# Patient Record
Sex: Female | Born: 1945
Health system: Southern US, Community
[De-identification: ages and names within clinical notes are randomized; demographics above are authoritative.]

## PROBLEM LIST (undated history)

## (undated) DIAGNOSIS — M069 Rheumatoid arthritis, unspecified: Secondary | ICD-10-CM

## (undated) DIAGNOSIS — H3530 Unspecified macular degeneration: Secondary | ICD-10-CM

## (undated) DIAGNOSIS — I82409 Acute embolism and thrombosis of unspecified deep veins of unspecified lower extremity: Secondary | ICD-10-CM

## (undated) DIAGNOSIS — T7840XA Allergy, unspecified, initial encounter: Secondary | ICD-10-CM

## (undated) DIAGNOSIS — Z973 Presence of spectacles and contact lenses: Secondary | ICD-10-CM

## (undated) DIAGNOSIS — Z86010 Personal history of colonic polyps: Secondary | ICD-10-CM

## (undated) DIAGNOSIS — G250 Essential tremor: Secondary | ICD-10-CM

## (undated) DIAGNOSIS — R009 Unspecified abnormalities of heart beat: Secondary | ICD-10-CM

## (undated) DIAGNOSIS — N390 Urinary tract infection, site not specified: Secondary | ICD-10-CM

## (undated) DIAGNOSIS — L719 Rosacea, unspecified: Secondary | ICD-10-CM

## (undated) DIAGNOSIS — I10 Essential (primary) hypertension: Secondary | ICD-10-CM

## (undated) DIAGNOSIS — R51 Headache: Secondary | ICD-10-CM

## (undated) DIAGNOSIS — J302 Other seasonal allergic rhinitis: Secondary | ICD-10-CM

## (undated) DIAGNOSIS — D689 Coagulation defect, unspecified: Secondary | ICD-10-CM

## (undated) DIAGNOSIS — I495 Sick sinus syndrome: Secondary | ICD-10-CM

## (undated) DIAGNOSIS — D6852 Prothrombin gene mutation: Secondary | ICD-10-CM

## (undated) DIAGNOSIS — N8110 Cystocele, unspecified: Secondary | ICD-10-CM

## (undated) DIAGNOSIS — I809 Phlebitis and thrombophlebitis of unspecified site: Secondary | ICD-10-CM

## (undated) DIAGNOSIS — R001 Bradycardia, unspecified: Secondary | ICD-10-CM

## (undated) DIAGNOSIS — E785 Hyperlipidemia, unspecified: Secondary | ICD-10-CM

## (undated) DIAGNOSIS — R259 Unspecified abnormal involuntary movements: Secondary | ICD-10-CM

## (undated) DIAGNOSIS — K219 Gastro-esophageal reflux disease without esophagitis: Secondary | ICD-10-CM

## (undated) DIAGNOSIS — M5431 Sciatica, right side: Secondary | ICD-10-CM

## (undated) DIAGNOSIS — H269 Unspecified cataract: Secondary | ICD-10-CM

## (undated) DIAGNOSIS — N6019 Diffuse cystic mastopathy of unspecified breast: Secondary | ICD-10-CM

## (undated) DIAGNOSIS — Z8679 Personal history of other diseases of the circulatory system: Secondary | ICD-10-CM

## (undated) DIAGNOSIS — R519 Headache, unspecified: Secondary | ICD-10-CM

## (undated) DIAGNOSIS — Z860101 Personal history of adenomatous and serrated colon polyps: Secondary | ICD-10-CM

## (undated) DIAGNOSIS — R7302 Impaired glucose tolerance (oral): Principal | ICD-10-CM

## (undated) DIAGNOSIS — Z86718 Personal history of other venous thrombosis and embolism: Secondary | ICD-10-CM

## (undated) DIAGNOSIS — I4891 Unspecified atrial fibrillation: Secondary | ICD-10-CM

## (undated) DIAGNOSIS — G8929 Other chronic pain: Secondary | ICD-10-CM

## (undated) DIAGNOSIS — M1990 Unspecified osteoarthritis, unspecified site: Secondary | ICD-10-CM

## (undated) HISTORY — PX: VARICOSE VEIN SURGERY: SHX832

## (undated) HISTORY — DX: Sick sinus syndrome: I49.5

## (undated) HISTORY — PX: POLYPECTOMY: SHX149

## (undated) HISTORY — DX: Gastro-esophageal reflux disease without esophagitis: K21.9

## (undated) HISTORY — DX: Headache, unspecified: R51.9

## (undated) HISTORY — DX: Headache: R51

## (undated) HISTORY — DX: Bradycardia, unspecified: R00.1

## (undated) HISTORY — DX: Essential (primary) hypertension: I10

## (undated) HISTORY — DX: Sciatica, right side: M54.31

## (undated) HISTORY — DX: Hyperlipidemia, unspecified: E78.5

## (undated) HISTORY — DX: Coagulation defect, unspecified: D68.9

## (undated) HISTORY — PX: CATARACT EXTRACTION W/ INTRAOCULAR LENS IMPLANT: SHX1309

## (undated) HISTORY — DX: Essential tremor: G25.0

## (undated) HISTORY — DX: Phlebitis and thrombophlebitis of unspecified site: I80.9

## (undated) HISTORY — DX: Unspecified cataract: H26.9

## (undated) HISTORY — DX: Prothrombin gene mutation: D68.52

## (undated) HISTORY — DX: Unspecified abnormal involuntary movements: R25.9

## (undated) HISTORY — DX: Rheumatoid arthritis, unspecified: M06.9

## (undated) HISTORY — DX: Acute embolism and thrombosis of unspecified deep veins of unspecified lower extremity: I82.409

## (undated) HISTORY — DX: Rosacea, unspecified: L71.9

## (undated) HISTORY — PX: VEIN SURGERY: SHX48

## (undated) HISTORY — DX: Allergy, unspecified, initial encounter: T78.40XA

## (undated) HISTORY — DX: Impaired glucose tolerance (oral): R73.02

## (undated) HISTORY — DX: Urinary tract infection, site not specified: N39.0

## (undated) HISTORY — DX: Unspecified atrial fibrillation: I48.91

## (undated) HISTORY — PX: ABDOMINAL HYSTERECTOMY: SHX81

## (undated) HISTORY — PX: CATARACT EXTRACTION, BILATERAL: SHX1313

## (undated) HISTORY — DX: Diffuse cystic mastopathy of unspecified breast: N60.19

---
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
## 1974-10-17 HISTORY — PX: TUBAL LIGATION: SHX77

## 1995-10-18 HISTORY — PX: VAGINAL HYSTERECTOMY: SHX2639

## 1998-05-04 ENCOUNTER — Ambulatory Visit (HOSPITAL_COMMUNITY): Admission: RE | Admit: 1998-05-04 | Discharge: 1998-05-04 | Payer: Self-pay | Admitting: Internal Medicine

## 1998-06-29 ENCOUNTER — Other Ambulatory Visit: Admission: RE | Admit: 1998-06-29 | Discharge: 1998-06-29 | Payer: Self-pay | Admitting: Obstetrics and Gynecology

## 1998-10-17 DIAGNOSIS — I82409 Acute embolism and thrombosis of unspecified deep veins of unspecified lower extremity: Secondary | ICD-10-CM

## 1998-10-17 HISTORY — DX: Acute embolism and thrombosis of unspecified deep veins of unspecified lower extremity: I82.409

## 1999-05-04 ENCOUNTER — Encounter: Payer: Self-pay | Admitting: Internal Medicine

## 1999-05-04 ENCOUNTER — Ambulatory Visit (HOSPITAL_COMMUNITY): Admission: RE | Admit: 1999-05-04 | Discharge: 1999-05-04 | Payer: Self-pay | Admitting: Internal Medicine

## 1999-07-20 ENCOUNTER — Other Ambulatory Visit: Admission: RE | Admit: 1999-07-20 | Discharge: 1999-07-20 | Payer: Self-pay | Admitting: Obstetrics and Gynecology

## 2000-05-09 ENCOUNTER — Encounter: Payer: Self-pay | Admitting: Internal Medicine

## 2000-05-09 ENCOUNTER — Ambulatory Visit (HOSPITAL_COMMUNITY): Admission: RE | Admit: 2000-05-09 | Discharge: 2000-05-09 | Payer: Self-pay | Admitting: Internal Medicine

## 2000-07-18 ENCOUNTER — Other Ambulatory Visit: Admission: RE | Admit: 2000-07-18 | Discharge: 2000-07-18 | Payer: Self-pay | Admitting: Obstetrics and Gynecology

## 2001-05-16 ENCOUNTER — Encounter: Payer: Self-pay | Admitting: Internal Medicine

## 2001-05-16 ENCOUNTER — Ambulatory Visit (HOSPITAL_COMMUNITY): Admission: RE | Admit: 2001-05-16 | Discharge: 2001-05-16 | Payer: Self-pay | Admitting: Internal Medicine

## 2002-05-22 ENCOUNTER — Ambulatory Visit (HOSPITAL_COMMUNITY): Admission: RE | Admit: 2002-05-22 | Discharge: 2002-05-22 | Payer: Self-pay | Admitting: Internal Medicine

## 2002-05-22 ENCOUNTER — Encounter: Payer: Self-pay | Admitting: Internal Medicine

## 2003-06-05 ENCOUNTER — Ambulatory Visit (HOSPITAL_COMMUNITY): Admission: RE | Admit: 2003-06-05 | Discharge: 2003-06-05 | Payer: Self-pay | Admitting: Internal Medicine

## 2003-06-05 ENCOUNTER — Encounter: Payer: Self-pay | Admitting: Internal Medicine

## 2003-11-03 ENCOUNTER — Ambulatory Visit (HOSPITAL_COMMUNITY): Admission: RE | Admit: 2003-11-03 | Discharge: 2003-11-03 | Payer: Self-pay | Admitting: Internal Medicine

## 2004-06-22 ENCOUNTER — Ambulatory Visit (HOSPITAL_COMMUNITY): Admission: RE | Admit: 2004-06-22 | Discharge: 2004-06-22 | Payer: Self-pay | Admitting: Internal Medicine

## 2004-08-28 ENCOUNTER — Ambulatory Visit: Payer: Self-pay | Admitting: Cardiology

## 2004-08-28 ENCOUNTER — Inpatient Hospital Stay (HOSPITAL_COMMUNITY): Admission: EM | Admit: 2004-08-28 | Discharge: 2004-08-30 | Payer: Self-pay | Admitting: Emergency Medicine

## 2005-06-23 ENCOUNTER — Ambulatory Visit (HOSPITAL_COMMUNITY): Admission: RE | Admit: 2005-06-23 | Discharge: 2005-06-23 | Payer: Self-pay | Admitting: Obstetrics and Gynecology

## 2006-06-27 ENCOUNTER — Ambulatory Visit (HOSPITAL_COMMUNITY): Admission: RE | Admit: 2006-06-27 | Discharge: 2006-06-27 | Payer: Self-pay | Admitting: Obstetrics and Gynecology

## 2006-08-22 ENCOUNTER — Ambulatory Visit (HOSPITAL_COMMUNITY): Admission: RE | Admit: 2006-08-22 | Discharge: 2006-08-22 | Payer: Self-pay | Admitting: Internal Medicine

## 2006-10-26 ENCOUNTER — Ambulatory Visit: Payer: Self-pay

## 2006-10-26 ENCOUNTER — Encounter: Payer: Self-pay | Admitting: Cardiology

## 2007-07-02 ENCOUNTER — Ambulatory Visit (HOSPITAL_COMMUNITY): Admission: RE | Admit: 2007-07-02 | Discharge: 2007-07-02 | Payer: Self-pay | Admitting: Internal Medicine

## 2007-08-02 ENCOUNTER — Ambulatory Visit: Payer: Self-pay | Admitting: Gastroenterology

## 2007-08-14 ENCOUNTER — Ambulatory Visit: Payer: Self-pay | Admitting: Gastroenterology

## 2008-07-03 ENCOUNTER — Ambulatory Visit (HOSPITAL_COMMUNITY): Admission: RE | Admit: 2008-07-03 | Discharge: 2008-07-03 | Payer: Self-pay | Admitting: Obstetrics and Gynecology

## 2009-07-13 ENCOUNTER — Ambulatory Visit (HOSPITAL_COMMUNITY): Admission: RE | Admit: 2009-07-13 | Discharge: 2009-07-13 | Payer: Self-pay | Admitting: Internal Medicine

## 2009-07-16 ENCOUNTER — Encounter: Admission: RE | Admit: 2009-07-16 | Discharge: 2009-07-16 | Payer: Self-pay | Admitting: Internal Medicine

## 2010-07-22 ENCOUNTER — Ambulatory Visit (HOSPITAL_COMMUNITY): Admission: RE | Admit: 2010-07-22 | Discharge: 2010-07-22 | Payer: Self-pay | Admitting: Internal Medicine

## 2010-11-08 ENCOUNTER — Encounter: Payer: Self-pay | Admitting: Internal Medicine

## 2010-12-16 DIAGNOSIS — Z8679 Personal history of other diseases of the circulatory system: Secondary | ICD-10-CM

## 2010-12-16 HISTORY — DX: Personal history of other diseases of the circulatory system: Z86.79

## 2011-01-14 ENCOUNTER — Inpatient Hospital Stay (HOSPITAL_COMMUNITY)
Admission: EM | Admit: 2011-01-14 | Discharge: 2011-01-17 | DRG: 310 | Disposition: A | Discharge status: Home / Self Care | Payer: Medicare Other | Source: Ambulatory Visit | Attending: Interventional Cardiology | Admitting: Interventional Cardiology

## 2011-01-14 ENCOUNTER — Emergency Department (HOSPITAL_COMMUNITY)
Admission: EM | Admit: 2011-01-14 | Discharge: 2011-01-14 | Disposition: A | Discharge status: Other Acute Inpatient Hospital | Payer: Medicare Other | Attending: Emergency Medicine | Admitting: Emergency Medicine

## 2011-01-14 DIAGNOSIS — I10 Essential (primary) hypertension: Secondary | ICD-10-CM | POA: Diagnosis present

## 2011-01-14 DIAGNOSIS — I080 Rheumatic disorders of both mitral and aortic valves: Secondary | ICD-10-CM | POA: Diagnosis present

## 2011-01-14 DIAGNOSIS — T448X5A Adverse effect of centrally-acting and adrenergic-neuron-blocking agents, initial encounter: Secondary | ICD-10-CM | POA: Diagnosis present

## 2011-01-14 DIAGNOSIS — I4891 Unspecified atrial fibrillation: Secondary | ICD-10-CM | POA: Diagnosis present

## 2011-01-14 DIAGNOSIS — R0602 Shortness of breath: Secondary | ICD-10-CM | POA: Diagnosis present

## 2011-01-14 DIAGNOSIS — T463X5A Adverse effect of coronary vasodilators, initial encounter: Secondary | ICD-10-CM | POA: Diagnosis present

## 2011-01-14 DIAGNOSIS — E785 Hyperlipidemia, unspecified: Secondary | ICD-10-CM | POA: Diagnosis present

## 2011-01-14 DIAGNOSIS — Z7982 Long term (current) use of aspirin: Secondary | ICD-10-CM

## 2011-01-14 DIAGNOSIS — I498 Other specified cardiac arrhythmias: Principal | ICD-10-CM | POA: Diagnosis present

## 2011-01-14 DIAGNOSIS — R51 Headache: Secondary | ICD-10-CM | POA: Diagnosis present

## 2011-01-14 LAB — CBC
HCT: 34.3 % — ABNORMAL LOW (ref 36.0–46.0)
Hemoglobin: 11.2 g/dL — ABNORMAL LOW (ref 12.0–15.0)
MCHC: 32.7 g/dL (ref 30.0–36.0)
RBC: 3.7 MIL/uL — ABNORMAL LOW (ref 3.87–5.11)
RDW: 12.1 % (ref 11.5–15.5)
WBC: 8.2 10*3/uL (ref 4.0–10.5)

## 2011-01-14 LAB — DIFFERENTIAL
Basophils Relative: 1 % (ref 0–1)
Eosinophils Relative: 3 % (ref 0–5)
Monocytes Relative: 8 % (ref 3–12)
Neutro Abs: 4.4 10*3/uL (ref 1.7–7.7)
Neutrophils Relative %: 53 % (ref 43–77)

## 2011-01-14 LAB — CK TOTAL AND CKMB (NOT AT ARMC)
CK, MB: 0.8 ng/mL (ref 0.3–4.0)
Total CK: 89 U/L (ref 7–177)

## 2011-01-14 LAB — BASIC METABOLIC PANEL
BUN: 17 mg/dL (ref 6–23)
GFR calc non Af Amer: 48 mL/min — ABNORMAL LOW (ref >60)
Glucose, Bld: 128 mg/dL — ABNORMAL HIGH (ref 70–99)
Potassium: 4.4 mEq/L (ref 3.5–5.1)

## 2011-01-15 ENCOUNTER — Inpatient Hospital Stay (HOSPITAL_COMMUNITY): Payer: Medicare Other

## 2011-01-15 DIAGNOSIS — I495 Sick sinus syndrome: Secondary | ICD-10-CM

## 2011-01-15 LAB — COMPREHENSIVE METABOLIC PANEL
AST: 32 U/L (ref 0–37)
Albumin: 3.1 g/dL — ABNORMAL LOW (ref 3.5–5.2)
BUN: 15 mg/dL (ref 6–23)
Chloride: 107 mEq/L (ref 96–112)
Creatinine, Ser: 1.05 mg/dL (ref 0.4–1.2)
GFR calc Af Amer: >60 mL/min (ref >60)
GFR calc non Af Amer: 53 mL/min — ABNORMAL LOW (ref >60)
Glucose, Bld: 99 mg/dL (ref 70–99)
Total Protein: 6 g/dL (ref 6.0–8.3)

## 2011-01-15 LAB — CARDIAC PANEL(CRET KIN+CKTOT+MB+TROPI)
CK, MB: 0.6 ng/mL (ref 0.3–4.0)
CK, MB: 1.7 ng/mL (ref 0.3–4.0)
Relative Index: INVALID (ref 0.0–2.5)
Relative Index: 1.5 (ref 0.0–2.5)
Total CK: 74 U/L (ref 7–177)
Total CK: 111 U/L (ref 7–177)

## 2011-01-15 LAB — TSH: TSH: 4.46 u[IU]/mL (ref 0.350–4.500)

## 2011-01-15 LAB — MRSA PCR SCREENING: MRSA by PCR: NEGATIVE

## 2011-01-16 DIAGNOSIS — I059 Rheumatic mitral valve disease, unspecified: Secondary | ICD-10-CM

## 2011-01-19 NOTE — Discharge Summary (Signed)
NAMERON, JUNCO NO.:  [id number]  MEDICAL RECORD NO.:  [id number]           PATIENT TYPE:  I  LOCATION:  2039                         FACILITY:  [facility]  PHYSICIAN:  Armanda Magic, M.D.     DATE OF BIRTH:  June 09, 1946  DATE OF ADMISSION:  01/14/2011 DATE OF DISCHARGE:  01/17/2011                              DISCHARGE SUMMARY   ADMISSION DIAGNOSES: 1. Dizzy. 2. Junctional bradycardia. 3. Hyperlipidemia. 4. Chronic headaches. 5. Shortness of breath. 6. Hypertension.  DISCHARGE DIAGNOSES: 1. Junctional bradycardia secondary to beta-blocker and calcium     channel blocker use resolved, off beta blockers and calcium channel     blockers. 2. History of atrial fibrillation with rapid ventricular response in     the past. 3. Shortness of breath with normal left ventricular function by 2-D     echocardiogram. 4. Hypertension. 5. Chronic headaches. 6. Dyslipidemia.  PROCEDURES:  None.  HISTORY OF PRESENT ILLNESS:  This is a 65 year old female with a history of dyslipidemia and hypertension who apparently has a diagnosis of rapid heart beat in the past and had been treated with verapamil and atenolol. There was a question as to whether she had a diagnosis of atrial fibrillation was unclear on this admission.  About 6 days prior days prior to admission, she had onset of diarrhea with dizziness followed by episodic dizziness and shortness of breath.  She denied any chest pain, but complained of fatigue and malaise and had intermittent shortness of breath throughout the week.  She presented to the emergency room and was found to be in a junctional rhythm, was given atropine and calcium chloride.  She denied any syncopal episode.  Her verapamil and atenolol were stopped and she had complete resolution of her bradycardia and junctional rhythm.  Her blood pressure remained stable and well- controlled throughout her hospital course off the verapamil  and atenolol.  A 2-D echocardiogram was performed which showed normal LV function, EF 55-60% with mild LVH, mild aortic and mitral regurgitation. On the day of discharge, she was in stable condition, ambulating the hall without difficulty.  Her labs TSH 4.46.  Cardiac enzymes were negative x3.  Sodium 137, potassium 4.4, chloride 103, CO2 of 26, glucose 128, BUN 17, creatinine 1.14, calcium 9.4, white cell count 8.2, hemoglobin 11.2, hematocrit 34.3, platelet count 274.  Chest x-ray showed no acute cardiopulmonary findings.  DISCHARGE MEDICATIONS: 1. Simvastatin 40 mg daily. 2. Aspirin 81 mg daily. 3. Vitamin B12 one tablet every other day. 4. She was instructed to stop her verapamil and atenolol.  FOLLOWUP:  She will follow up with Dr. Verdis Prime on Tuesday, January 18, 2011 at 11:45 a.m. for reassessment of her blood pressure as well as the medication changes to determine if she needs to be on anything else given her history of paroxysmal palpitations in the past.  Also to determine whether she needs to have further risk ratification with nuclear stress testing due to her shortness of breath.  I spent a total of 35 minutes in preparing this patient's discharge including discharge note, preparing discharge medication list, and discharge followup and dictating this  discharge note.     Armanda Magic, M.D.     TT/MEDQ  D:  01/17/2011  T:  01/18/2011  Job:  [id number]  Electronically Signed by Armanda Magic M.D. on 01/19/2011 11:26:31 AM

## 2011-01-26 ENCOUNTER — Encounter (INDEPENDENT_AMBULATORY_CARE_PROVIDER_SITE_OTHER): Payer: Medicare Other

## 2011-01-26 DIAGNOSIS — R609 Edema, unspecified: Secondary | ICD-10-CM

## 2011-01-26 DIAGNOSIS — M79609 Pain in unspecified limb: Secondary | ICD-10-CM

## 2011-01-28 NOTE — Procedures (Unsigned)
DUPLEX DEEP VENOUS EXAM - UPPER EXTREMITY  INDICATION:  Left upper extremity pain and edema.  HISTORY:  Edema:  Yes. Trauma/Surgery:  IV placement on 01/13/2011. Pain:  Yes. PE:  No. Previous DVT:  Left lower extremity phlebitis in 2008.  No DVT present. Anticoagulants:  Patient takes 325 mg daily of aspirin. Other:  DUPLEX EXAM:                                            Bas/               IJV   SCV     AXV    BrachV  Ceph V               R  L  R   L   R  L   R   L   R  L Thrombosis       o  o   o      o       o      o/+ Spontaneous      +  +   +      +       +      +/o Phasic           +  +   +      +       +      +/o Augmentation     +  +   +      +       +      +/o Compressible     +  +   +      +       +      +/o Competent Legend:  + - yes  o - no  p - partial  D - decreased   IMPRESSION: 1. No evidence of deep vein thrombosis identified involving the left     upper extremity. 2. Acute superficial thrombus identified involving the left cephalic     vein at the proximal upper arm segment to the antecubital fossa. 3. The remainder of the cephalic vein appears patent both above and     below thrombosed segment. 4. The basilic vein appears patent. 5. The contralateral subclavian vein is imaged and appears patent with     normal spontaneous and phasic flow.  A verbal preliminary was given to Loree Fee, PA-C, and she instructed patient to return to her office.       ___________________________________________ Quita Skye. Hart Rochester, M.D.  SH/MEDQ  D:  01/26/2011  T:  01/26/2011  Job:  [id number]

## 2011-02-02 NOTE — H&P (Signed)
NAME:  [name], [name] NO.:  [id number]  MEDICAL RECORD NO.:  [id number]           PATIENT TYPE:  E  LOCATION:  WLED                         FACILITY:  [facility]  PHYSICIAN:  Georga Hacking, M.D.DATE OF BIRTH:  09/22/1946  DATE OF ADMISSION:  01/14/2011                              HISTORY & PHYSICAL   REASON FOR ADMISSION:  Dizziness and bradycardia.  HISTORY:  The patient is a 65 year old female, who has a previous history of hypotension and dyslipidemia.  She had an admission in November 2005 to rule out a myocardial infarction and evidently had a normal Cardiolite study at that time.  She has normally felt well, but reports that she was diagnosed with some rapid heartbeat and has been treated with verapamil and atenolol.  About 6 days ago, she had the onset of diarrhea and had significant dizziness following that has had episodic dizziness and shortness of breath since then with further episode of diarrhea.  She has not had chest pain, but has complained of fatigue, malaise, and some intermittent dyspnea throughout the week. She felt poorly today and came to the emergency room where she was found to be in a junctional rhythm.  The emergency room doctor gave her a dose of atropine as well as calcium chloride.  She admits she was really never hypotensive and has not had any syncopal episode since then.  She has not had previous symptoms like this previously.  She denies PND, orthopnea, edema, or claudication.  She does have a history of some varicose veins and had difficulty with vein issues in the past.  PAST MEDICAL HISTORY:  Hypotension, hyperlipidemia, chronic headaches, and was evidently had some sort of vein stripping in the past.  PREVIOUS SURGERIES:  Tonsillectomy, hysterectomy, has had vein stripping previously.  ALLERGIES:  SULFA and VIBRAMYCIN.  CURRENT MEDICATIONS:  Atenolol, verapamil.  She previously was on Topamax.  She was previous on  Bumex, but this was discontinued.  FAMILY HISTORY:  Father died at age 10 of a stroke and heart disease. Mother died of liver cancer at age 39.  One sister is living and in good health.  SOCIAL HISTORY:  She is divorced.  She is not currently working.  She has three children.  Currently lives alone.  REVIEW OF SYSTEMS:  Her weight has been stable.  She thinks that she might have lost little bit of weight.  She previously had significant headaches and was on Topamax for these, but this has been stopped as a concern of visual problems.  She has no ear, nose, or throat problems. She has had some diarrhea as noted above.  No constipation.  Normally no ulcers or bleeding.  No GU symptoms.  She has had arthritis involving her right hand.  She has not had any swelling or leg tenderness.  She reports during one of the surgeries on her leg that she had difficulty moving her leg and the question was raised of stroke, although I cannot really find anything else pertaining to that.  Other than as noted above, the remainder of systems is unremarkable.  PHYSICAL EXAMINATION:  GENERAL:  She is  a pleasant middle-aged female, appearing stated age, lying on stretcher, in no acute distress. VITAL SIGNS:  Blood pressure is 110-130 over 60 with a slow pulse rate, occasional PVCs noted.  Respirations were 12. SKIN:  Warm and dry with no obvious mass or lesions. ENT:  EOMI.  PERRLA.  C and S clear.  Funduscopic was not examined. Pharynx is negative. NECK:  Supple without masses, JVD, thyromegaly, or bruits. LUNGS:  Clear bilaterally. CARDIOVASCULAR:  Slow rhythm.  No murmur noted.  Heart sounds somewhat muffled. ABDOMEN:  Soft and nontender without aneurysm or mass. EXTREMITIES:  Femoral and distal pulses are present, although the pulse is slow.  There is no edema noted. NEUROLOGIC:  Shows normal cranial nerves, sensory and motor were intact, was unable to examine gait due to bradycardia.  LABORATORY  DATA:  A 12-lead EKG shows junctional rhythm, some EKG shows episodic atrial fibrillation with slow response.  Occasional PVCs are also noted.  There is no chest x-ray.  Her lab data shows a hemoglobin of 11.2, hematocrit of 34.3, potassium is 4.4, BUN is 17, creatinine is 1.14.  Initial point-of-care enzymes are normal.  IMPRESSION: 1. Severe junctional rhythm with dizziness, but maintains blood     pressure. 2. Hypertension. 3. Previous history of hyperlipidemia. 4. History of headaches. 5. Recent diarrhea.  RECOMMENDATIONS:  The patient will be transferred to [facility] from [place].  We will check serial enzymes on her.  Her blood pressure is maintained now, so I do not think she is to have a temporary pacemaker.  She will need to have an echocardiogram and we will withhold her verapamil and atenolol and have her on no blood pressure medicines at the present time.  See what the heart rhythm does withdrawing AV nodal blocking agents and go from there in terms of treatment.     Georga Hacking, M.D.     WST/MEDQ  D:  01/14/2011  T:  01/14/2011  Job:  [id number]  cc:   Lyn Records, M.D. Lucky Cowboy, M.D.  Electronically Signed by Lacretia Nicks. Donnie Aho M.D. on 02/02/2011 02:05:09 PM

## 2011-03-04 NOTE — H&P (Signed)
NAME:  Ashley, [name] NO.:  [id number]   MEDICAL RECORD NO.:  [id number]          PATIENT TYPE:  EMS   LOCATION:  MAJO                         FACILITY:  [facility]   PHYSICIAN:  Marcie Mowers, M.D.DATE OF BIRTH:  02-Mar-1946   DATE OF ADMISSION:  08/28/2004  DATE OF DISCHARGE:                                HISTORY & PHYSICAL   PRIMARY CARE PHYSICIAN:  Lucky Cowboy, M.D.   CHIEF COMPLAINT:  Left sided chest pain since last night.   HISTORY OF PRESENT ILLNESS:  This is a 65 year old Caucasian female with a  past medical history of hypertension and abnormal cholesterol presents to  the emergency department with a history of left sided chest pain since last  night.  The chest pain started when she was in her bed trying to sleep.  It  was sudden in onset.  It was mostly on the left side, sometimes radiating to  her back.  It was pressure like in character.  It lasted 10-15 minutes and  then it resolved without taking any medications.  The pain recurred through  the night once with all the similar characteristics.  The patient states  that she has not felt right at work for the last two days.  She has been  nauseated for the same duration.  The patient also states that approximately  three months ago she had one occasion when she had a similar kind of pain in  the left side of her chest.  The patient does report that she has had  shortness of breath occasionally lately.  It is more when she tries to lay  down flat.  She always has to take at least two pillows.  She also reports  that she has sometimes gotten up in the middle of the night feeling hungry  for air.  She states that her primary care physician started her on some  inhalers because of this shortness of breath.  She has noticed some swelling  in her feet on and off but it is only when she comes back from long hours of  work.  The swelling is gone when she lies down and now wakes up in the  morning.   PAST MEDICAL HISTORY:  1.  Hypertension.  2.  Abnormal cholesterol.  3.  She does not have any history of diabetes.  She has never had a stress      test done in the past.   PAST SURGICAL HISTORY:  1.  Partial hysterectomy at the age of 83.  This was done because of      excessive vaginal bleeding.  2.  Tonsillectomy several years ago.   MEDICATIONS:  1.  Accupril 40 mg p.o. every day.  2.  Verapamil 240 mg p.o. every day.  3.  Atenolol 50 mg p.o. every day.  4.  Bumetanide 1 mg p.o. every day.  5.  Claritin p.r.n.   ALLERGIES:  NKDA.   SOCIAL HISTORY:  She lives with her youngest son in a townhouse.  She is  very active.  She works full time.  Walks about 4-5 miles  daily.  She  reports that she has been under stress lately at work because of very long  work hours.  She denies any tobacco or alcohol consumption ever.   FAMILY HISTORY:  There is a history of Alzheimer disease in her mother who  is 48 years old.  Her father is 29 and overall healthy.  She has two sons  and one daughter and they are all healthy.  Her grandmother died of colon  cancer.   REVIEW OF SYSTEMS:  As mentioned in the history of present illness, there  has been presence of some nausea, some shortness of breath occasionally, and  at least one occasion about three months ago when she had similar kind of  chest pain.  She denies any fever, cough, sputum production, any history of  wheezing, any dysuria, urgency, frequency, any abdominal pain, or emesis,  denies any diarrhea.  As mentioned in HPI, she does have some swelling in  her feet on and off but it is mostly related to being on her feet all day  long.   PHYSICAL EXAMINATION:  GENERAL:  The patient looks alert and awake.  She  does not appear to be in acute distress.  VITAL SIGNS:  Her temperature is 98 degrees, blood pressure upon arrival to  the emergency department was 158/75, a repeat blood pressure after four  hours was 112/54.  Her pulse is 72  per minute.  She is breathing at 16 per  minute.  Her pulse ox on room air is 100%.  HEENT:  Her pupils are equally reactive to light and accommodation.  Extraocular muscles are intact.  Her pharynx looks normal without any  erythema or exudate.  The head is normocephalic and atraumatic.  NECK:  There is no JVD.  No lymphadenopathy.  Thyroid appears normal.  No  carotid bruit.  SKIN:  Looks normal.  CARDIOVASCULAR:  Normal S1 S2.  No murmur appreciated.  No S3 or S4  appreciated.  PULMONARY:  Clear to auscultation bilaterally.  No crackles or wheezes.  ABDOMEN:  Nontender, nondistended.  It is soft.  There is no organomegaly.  The bowel sounds are present in all quadrants.  EXTREMITIES:  Without any clubbing, cyanosis, edema.  NEUROLOGIC:  Her cranial nerves II-XII are intact.  Overall exam is  nonfocal.   LABS:  White count is 6.5, hemoglobin 13.2, hematocrit of 37.4, her platelet  count is 236,000.  MCV is 88.4.  The differential on white count is normal.  Sodium is 139, potassium of 2.6, chloride 104, CO2 of 26, BUN 13, creatinine  of 0.9.  Blood glucose is 108.  Total bilirubin is 0.6.  Alkaline  phosphatase is 63.  AST 22, ALT 19, total protein is 7.1, albumin is 4.0,  calcium is 9.5.  First set of cardiac enzymes revealed a CK-MB of 1.0,  troponin less than 0.05, myoglobin 69.7.  Her INR is 1.2 and pro time of  14.7.   Chest x-ray reveals no acute disease, heart size and mediastinal contours  within normal limits, lungs are clear.   A 12-lead ECG shows a normal sinus rhythm, some nonspecific T wave changes  in leads V3, V4, and V5.   ASSESSMENT/PLAN:  1.  Chest pain.  We will admit the patient to telemetry.  The patient has      risk factors for myocardial infarction which include hypertension,      reportedly abnormal cholesterol by history which was being managed by  diet and she is more than 65 years old.  She will have three sets of     cardiac enzymes and three  12-lead electrocardiograms done eight hours      apart.  She will be started on aspirin 81 mg p.o. every day, Nitro paste      as needed for chest pain.  She will be started on healthy heart diet.      She will also need a stress test.  It overall depends on her hospital      course whether this stress test should be done while she is in the      hospital or post discharge.  2.  Shortness of breath.  The patient reports a history suggestive of      orthopnea and paroxysmal nocturnal dyspnea.  Currently she does not have      any clinical signs suggestive of any congestive heart failure.  Given      her long history of hypertension and even though her 12-lead      electrocardiogram is not suggestive, it will be reasonable to get a 2D      echocardiogram.  I will also check her BNP today.  We will continue her      on the bumetanide 1 mg orally every day which she has been taking for      over four years.  3.  Hypertension.  Her hypertension looks well controlled.  We will continue      her on her usual medications which include Accupril, atenolol,      verapamil.  4.  Abnormal cholesterol.  This is by history.  She has not been on any      medications.  Reportedly, her primary care physician was going to      discuss the issue with her.  I will check her fasting lipid profile.  5.  Deep vein thrombosis, peptic ulcer disease prophylaxis.  The patient      will be on bedrest until she is ruled out for a myocardial infarction.      If she is not able to ambulate for over 24 hours, we will start her on      the prophylaxis.       PMJ/MEDQ  D:  08/28/2004  T:  08/28/2004  Job:  [id number]

## 2011-03-04 NOTE — Discharge Summary (Signed)
NAMEJONIYA, [name] NO.:  [id number]   MEDICAL RECORD NO.:  [id number]          PATIENT TYPE:  INP   LOCATION:  3731                         FACILITY:  [facility]   PHYSICIAN:  Lonia Blood, M.D.      DATE OF BIRTH:  April 05, 1946   DATE OF ADMISSION:  08/28/2004  DATE OF DISCHARGE:  08/30/2004                                 DISCHARGE SUMMARY   DISCHARGE DIAGNOSIS:  1.  Chest pain, myocardial infarction ruled out.  2.  Transient shortness of breath.  3.  Hypertension.  4.  Dyslipidemia.  5.  Hypokalemia.   DISCHARGE MEDICATIONS:  The patient is being discharged on home medicines  including Accupril 40 mg daily, Verapamil 240 mg daily, Atenolol 50 mg  daily, Bumetanide 1 mg daily, and Claritin p.r.n., aspirin 81 mg daily.   DISPOSITION:  The patient is to follow up with primary care physician as  needed.  She is being discharged in good health with no repeat chest pain  since admission.  She also has a Cardiolite that was performed this morning  and the results are pending at this time.  The patient will be discharged,  if the Cardiolite result is normal, she will go home today, otherwise, this  will serve as an interim summary.   PROCEDURE PERFORMED:  1.  Cardiolite performed on August 30, 2004, the results are still      pending.  2.  Chest x-ray performed on admission showed no acute disease.   CONSULTATIONS:  None.   BRIEF HISTORY AND PHYSICAL:  Please refer to dictated history and physical.  In brief, this is a 65 year old white female who presented with left sided  chest pain.  The patient has history of long-standing hypertension,  dyslipidemia, and a family history of diabetes, family history of coronary  artery disease, which puts her in high risk status, hence, she was admitted  for rule out MI.   HOSPITAL COURSE:  Problem 1:  Chest pain.  The patient was admitted.  She had serial enzymes  checked to rule out MI.  Her EKG did not have acute  changes.  While in the  hospital, all her enzymes came back as negative.  Her chest pain seems to  have gotten better and she did not have any repeat prior to this discharge.  With the enzymes negative, she had a stress test done on August 30, 2004.  Based on the results of these tests, the patient will probably be discharged  home.   Problem 2:  Hypertension.  The patient's blood pressure was well controlled  on her home medicines and we did not make any changes.   Problem 3:  Dyslipidemia.  The patient has a history of dyslipidemia.  She  is not on any statin.  We will repeat her fasting lipid panel this admission  and her fasting lipids seem to be normal with total cholesterol 173,  triglycerides 136, HDL 41, and LDL mildly elevated at 105.   Problem 4:  Hypokalemia.  The patient had a low potassium on the second day  of admission, mainly at  3.6.  Her potassium is stable at 3.8 at the time of  discharge.       LG/MEDQ  D:  08/30/2004  T:  08/30/2004  Job:  [id number]

## 2012-02-11 ENCOUNTER — Emergency Department (HOSPITAL_COMMUNITY)
Admission: EM | Admit: 2012-02-11 | Discharge: 2012-02-12 | Disposition: A | Discharge status: Home / Self Care | Payer: Medicare Other | Attending: Emergency Medicine | Admitting: Emergency Medicine

## 2012-02-11 ENCOUNTER — Encounter (HOSPITAL_COMMUNITY): Payer: Self-pay

## 2012-02-11 DIAGNOSIS — Z79899 Other long term (current) drug therapy: Secondary | ICD-10-CM | POA: Insufficient documentation

## 2012-02-11 DIAGNOSIS — Z86718 Personal history of other venous thrombosis and embolism: Secondary | ICD-10-CM | POA: Insufficient documentation

## 2012-02-11 DIAGNOSIS — T7840XA Allergy, unspecified, initial encounter: Secondary | ICD-10-CM

## 2012-02-11 DIAGNOSIS — Z888 Allergy status to other drugs, medicaments and biological substances status: Secondary | ICD-10-CM | POA: Insufficient documentation

## 2012-02-11 DIAGNOSIS — T368X5A Adverse effect of other systemic antibiotics, initial encounter: Secondary | ICD-10-CM | POA: Insufficient documentation

## 2012-02-11 DIAGNOSIS — E860 Dehydration: Secondary | ICD-10-CM

## 2012-02-11 HISTORY — DX: Unspecified abnormalities of heart beat: R00.9

## 2012-02-11 MED ORDER — FAMOTIDINE 20 MG PO TABS
20.0000 mg | ORAL_TABLET | Freq: Once | ORAL | Status: AC
  Administered 2012-02-12: 20 mg via ORAL
  Filled 2012-02-11: qty 1

## 2012-02-11 MED ORDER — DIPHENHYDRAMINE HCL 25 MG PO CAPS
25.0000 mg | ORAL_CAPSULE | Freq: Once | ORAL | Status: AC
  Administered 2012-02-12: 25 mg via ORAL
  Filled 2012-02-11: qty 1

## 2012-02-11 MED ORDER — PREDNISONE 20 MG PO TABS
60.0000 mg | ORAL_TABLET | Freq: Once | ORAL | Status: DC
  Filled 2012-02-11: qty 3

## 2012-02-11 NOTE — ED Notes (Signed)
Pt presents with no acute distress.  Pt started on cipro BID for UTI- Pt c/o of lip swelling, facial swelling and tingling to ears bil.  Pt able to speak in complete sentences.  Pt also c/o of inability to void

## 2012-02-11 NOTE — ED Notes (Signed)
Pt states she has been on Cipro for UTI for 2 days.  Now pt having difficulty urinating, lips tingling, b/p elevated  185/93 at home, heart feels like it is racing.

## 2012-02-12 LAB — POCT I-STAT, CHEM 8
Calcium, Ion: 1.1 mmol/L — ABNORMAL LOW (ref 1.12–1.32)
Glucose, Bld: 112 mg/dL — ABNORMAL HIGH (ref 70–99)
HCT: 36 % (ref 36.0–46.0)
Hemoglobin: 12.2 g/dL (ref 12.0–15.0)
TCO2: 23 mmol/L (ref 0–100)

## 2012-02-12 LAB — URINALYSIS, ROUTINE W REFLEX MICROSCOPIC
Glucose, UA: NEGATIVE mg/dL
Leukocytes, UA: NEGATIVE
Nitrite: NEGATIVE
Specific Gravity, Urine: 1.022 (ref 1.005–1.030)
pH: 6 (ref 5.0–8.0)

## 2012-02-12 LAB — CBC
HCT: 33.4 % — ABNORMAL LOW (ref 36.0–46.0)
Hemoglobin: 11.6 g/dL — ABNORMAL LOW (ref 12.0–15.0)
RBC: 3.73 MIL/uL — ABNORMAL LOW (ref 3.87–5.11)

## 2012-02-12 MED ORDER — DIPHENHYDRAMINE HCL 25 MG PO CAPS: 25.0000 mg | ORAL_CAPSULE | Freq: Four times a day (QID) | ORAL | Status: DC | PRN

## 2012-02-12 MED ORDER — FAMOTIDINE 20 MG PO TABS: 20.0000 mg | ORAL_TABLET | Freq: Two times a day (BID) | ORAL | Status: DC

## 2012-02-12 MED ORDER — SODIUM CHLORIDE 0.9 % IV BOLUS (SEPSIS)
1000.0000 mL | Freq: Once | INTRAVENOUS | Status: AC
  Administered 2012-02-12: 1000 mL via INTRAVENOUS

## 2012-02-12 MED ORDER — SODIUM CHLORIDE 0.9 % IV SOLN: INTRAVENOUS | Status: DC

## 2012-02-12 NOTE — ED Notes (Signed)
Foley catheter insertion performed. 15 ml of urine noted on output. Urine specimen sent to lab.

## 2012-02-12 NOTE — ED Notes (Signed)
Pt swelling has gotten worse on lower lip (more prominent on lower right). Swelling on left side of the face has slightly gotten worse.

## 2012-02-12 NOTE — ED Notes (Signed)
List of allergies noted in pt chart.

## 2012-02-12 NOTE — ED Notes (Signed)
[name] (daughter) [phone]

## 2012-02-12 NOTE — ED Provider Notes (Signed)
History     CSN: [id number]  Arrival date & time 02/11/12  2240   First MD Initiated Contact with Patient 02/11/12 2320      Chief Complaint  Patient presents with  . Allergic Reaction  . Urinary Retention    (Consider location/radiation/quality/duration/timing/severity/associated sxs/prior treatment) Patient is a 66 y.o. female presenting with allergic reaction. The history is provided by the patient.  Allergic Reaction The primary symptoms do not include shortness of breath, abdominal pain or rash.   patient started on Cipro 2 days ago by her primary care physician for UTI. She has had some lower back pain but denies any dysuria, urgency or frequency. She did have a fever about 4 days ago at home. No fever since that time. No nausea or vomiting. Tonight she developed some tongue swelling and lip tingling. She states that prior to arrival she felt like she had some swelling in her throat and took Benadryl and that has resolved. No difficulty breathing. No rashes. Patient is worried that she is allergic to Cipro. Patient also complaining of unable to urinate tonight. No urgency or frequency. States she only urinated a small amount prior to arrival and now unable to make urine. Other than Cipro, no new medications. No history of urinary retention. Symptoms mild/moderate severity. No chest pain or shortness of breath.  Past Medical History  Diagnosis Date  . DVT (deep vein thrombosis) in pregnancy   . UTI (urinary tract infection)   . Heart beat abnormality     Past Surgical History  Procedure Date  . Eye surgery   . Abdominal hysterectomy   . Tonsillectomy     No family history on file.  History  Substance Use Topics  . Smoking status: Never Smoker   . Smokeless tobacco: Not on file  . Alcohol Use: No    OB History    Grav Para Term Preterm Abortions TAB SAB Ect Mult Living                  Review of Systems  Constitutional: Negative for fever and chills.  HENT:  Negative for neck pain and neck stiffness.   Eyes: Negative for pain.  Respiratory: Negative for shortness of breath.   Cardiovascular: Negative for chest pain.  Gastrointestinal: Negative for abdominal pain.  Genitourinary: Negative for dysuria and pelvic pain.  Musculoskeletal: Positive for back pain.  Skin: Negative for rash.  Neurological: Negative for headaches.  All other systems reviewed and are negative.    Allergies  Alendronate sodium; Atenolol; Ciprofloxacin; Codeine; Cortisone; Doxycycline; Mineral oil; Pravastatin; Prednisone; Ramipril; Topamax; and Verapamil  Home Medications   Current Outpatient Rx  Name Route Sig Dispense Refill  . BENAZEPRIL HCL 20 MG PO TABS Oral Take 20 mg by mouth daily.    Marland Kitchen BISOPROLOL-HYDROCHLOROTHIAZIDE 5-6.25 MG PO TABS Oral Take 1 tablet by mouth daily.    Marland Kitchen CIPROFLOXACIN HCL 500 MG PO TABS Oral Take 500 mg by mouth 2 (two) times daily.    Marland Kitchen LISINOPRIL-HYDROCHLOROTHIAZIDE 20-12.5 MG PO TABS Oral Take 1 tablet by mouth daily.    Marland Kitchen PRAVASTATIN SODIUM 40 MG PO TABS Oral Take 40 mg by mouth daily.    Marland Kitchen RANITIDINE HCL 300 MG PO CAPS Oral Take 300 mg by mouth 2 (two) times daily.    . AMOXICILLIN 250 MG PO CAPS Oral Take 250 mg by mouth 3 (three) times daily.      BP 135/50  Pulse 59  Temp(Src) 97.9 F (36.6 C) (  Oral)  Resp 18  Ht 5\' 4"  (1.626 m)  Wt 175 lb (79.379 kg)  BMI 30.04 kg/m2  SpO2 100%  Physical Exam  Constitutional: She is oriented to person, place, and time. She appears well-developed and well-nourished.  HENT:  Head: Normocephalic and atraumatic.       Mildly dry mucous membranes. Uvula midline. I do not appreciate any oral, lingual or lip swelling. No submandibular fullness. Trachea midline. No stridor  Eyes: Conjunctivae and EOM are normal. Pupils are equal, round, and reactive to light.  Neck: Trachea normal. Neck supple. No thyromegaly present.  Cardiovascular: Normal rate, regular rhythm, S1 normal, S2 normal and  normal pulses.     No systolic murmur is present   No diastolic murmur is present  Pulses:      Radial pulses are 2+ on the right side, and 2+ on the left side.  Pulmonary/Chest: Effort normal and breath sounds normal. No respiratory distress. She has no wheezes. She has no rhonchi.  Abdominal: Soft. Normal appearance and bowel sounds are normal. There is no tenderness. There is no CVA tenderness and negative Murphy's sign.  Musculoskeletal:       BLE:s Calves nontender, no cords or erythema, negative Homans sign  Neurological: She is alert and oriented to person, place, and time. She has normal strength. No cranial nerve deficit or sensory deficit. GCS eye subscore is 4. GCS verbal subscore is 5. GCS motor subscore is 6.  Skin: Skin is warm and dry. No rash noted. She is not diaphoretic.  Psychiatric: Her speech is normal.       Cooperative and appropriate    ED Course  Procedures (including critical care time)  Results for orders placed during the hospital encounter of 02/11/12  URINALYSIS, ROUTINE W REFLEX MICROSCOPIC      Component Value Range   Color, Urine YELLOW  YELLOW    APPearance CLEAR  CLEAR    Specific Gravity, Urine 1.022  1.005 - 1.030    pH 6.0  5.0 - 8.0    Glucose, UA NEGATIVE  NEGATIVE (mg/dL)   Hgb urine dipstick NEGATIVE  NEGATIVE    Bilirubin Urine NEGATIVE  NEGATIVE    Ketones, ur NEGATIVE  NEGATIVE (mg/dL)   Protein, ur NEGATIVE  NEGATIVE (mg/dL)   Urobilinogen, UA 0.2  0.0 - 1.0 (mg/dL)   Nitrite NEGATIVE  NEGATIVE    Leukocytes, UA NEGATIVE  NEGATIVE   CBC      Component Value Range   WBC 9.3  4.0 - 10.5 (K/uL)   RBC 3.73 (*) 3.87 - 5.11 (MIL/uL)   Hemoglobin 11.6 (*) 12.0 - 15.0 (g/dL)   HCT 16.1 (*) 09.6 - 46.0 (%)   MCV 89.5  78.0 - 100.0 (fL)   MCH 31.1  26.0 - 34.0 (pg)   MCHC 34.7  30.0 - 36.0 (g/dL)   RDW 04.5  40.9 - 81.1 (%)   Platelets 291  150 - 400 (K/uL)  POCT I-STAT, CHEM 8      Component Value Range   Sodium 131 (*) 135 - 145  (mEq/L)   Potassium 3.6  3.5 - 5.1 (mEq/L)   Chloride 97  96 - 112 (mEq/L)   BUN 17  6 - 23 (mg/dL)   Creatinine, Ser 9.14  0.50 - 1.10 (mg/dL)   Glucose, Bld 782 (*) 70 - 99 (mg/dL)   Calcium, Ion 9.56 (*) 1.12 - 1.32 (mmol/L)   TCO2 23  0 - 100 (mmol/L)   Hemoglobin 12.2  12.0 - 15.0 (g/dL)   HCT 04.5  40.9 - 81.1 (%)    Cath UA obtained and reviewed as above. Only about 15 mL's of urine in Foley bag. Foley removed and patient given IV fluids and by mouth fluids for possible dehydration.  She states she is allergic to prednisone and that causes the same symptoms she is having now. Benadryl and Pepcid provided.  Recheck at 5:15 AM. Patient has got up and use the bathroom and is now making good urine without any retention. She feels much better and is requesting to be discharged home. MDM   Possible allergic reaction. Normal UA as above. Urine culture sent and pending. Plan stop Cipro at this time. Patient given IV fluids and by mouth fluids without urinary retention in the ED. She is able to make urine. Labs obtained and reviewed as above. Stable for discharge home and outpatient followup. Reliable historian states understanding discharge and followup instructions, and strict return precautions for any worsening condition. Continue Benadryl and Pepcid for any further symptoms.        Sunnie Nielsen, MD 02/12/12 [phone]

## 2012-02-12 NOTE — ED Notes (Signed)
Pt states that her left leg is cramping. She has a history blood clots, so she's been moving it around.

## 2012-02-12 NOTE — ED Notes (Signed)
Patient is alert and oriented x3.  She was given DC instructions and follow up visit instructions.  Patient gave verbal understanding. She was DC ambulatory under his own power to home.  V/S stable.  He was not showing any signs of distress on DC 

## 2012-02-12 NOTE — Discharge Instructions (Signed)
Dehydration, Adult Dehydration is when you lose more fluids from the body than you take in. Vital organs like the kidneys, brain, and heart cannot function without a proper amount of fluids and salt. Any loss of fluids from the body can cause dehydration.  CAUSES   Vomiting.   Diarrhea.   Excessive sweating.   Excessive urine output.   Fever.  SYMPTOMS  Mild dehydration  Thirst.   Dry lips.   Slightly dry mouth.  Moderate dehydration  Very dry mouth.   Sunken eyes.   Skin does not bounce back quickly when lightly pinched and released.   Dark urine and decreased urine production.   Decreased tear production.   Headache.  Severe dehydration  Very dry mouth.   Extreme thirst.   Rapid, weak pulse (more than 100 beats per minute at rest).   Cold hands and feet.   Not able to sweat in spite of heat and temperature.   Rapid breathing.   Blue lips.   Confusion and lethargy.   Difficulty being awakened.   Minimal urine production.   No tears.  DIAGNOSIS  Your caregiver will diagnose dehydration based on your symptoms and your exam. Blood and urine tests will help confirm the diagnosis. The diagnostic evaluation should also identify the cause of dehydration. TREATMENT  Treatment of mild or moderate dehydration can often be done at home by increasing the amount of fluids that you drink. It is best to drink small amounts of fluid more often. Drinking too much at one time can make vomiting worse. Refer to the home care instructions below. Severe dehydration needs to be treated at the hospital where you will probably be given intravenous (IV) fluids that contain water and electrolytes. HOME CARE INSTRUCTIONS   Ask your caregiver about specific rehydration instructions.   Drink enough fluids to keep your urine clear or pale yellow.   Drink small amounts frequently if you have nausea and vomiting.   Eat as you normally do.   Avoid:   Foods or drinks high in  sugar.   Carbonated drinks.   Juice.   Extremely hot or cold fluids.   Drinks with caffeine.   Fatty, greasy foods.   Alcohol.   Tobacco.   Overeating.   Gelatin desserts.   Wash your hands well to avoid spreading bacteria and viruses.   Only take over-the-counter or prescription medicines for pain, discomfort, or fever as directed by your caregiver.   Ask your caregiver if you should continue all prescribed and over-the-counter medicines.   Keep all follow-up appointments with your caregiver.  SEEK MEDICAL CARE IF:  You have abdominal pain and it increases or stays in one area (localizes).   You have a rash, stiff neck, or severe headache.   You are irritable, sleepy, or difficult to awaken.   You are weak, dizzy, or extremely thirsty.  SEEK IMMEDIATE MEDICAL CARE IF:   You are unable to keep fluids down or you get worse despite treatment.   You have frequent episodes of vomiting or diarrhea.   You have blood or green matter (bile) in your vomit.   You have blood in your stool or your stool looks black and tarry.   You have not urinated in 6 to 8 hours, or you have only urinated a small amount of very dark urine.   You have a fever.   You faint.  MAKE SURE YOU:   Understand these instructions.   Will watch your condition.     Will get help right away if you are not doing well or get worse.    Drug Allergy  Allergic reactions to medicines are common. Some allergic reactions are mild. A delayed type of drug allergy that occurs 1 week or more after exposure to a medicine or vaccine is called serum sickness. A life-threatening, sudden (acute) allergic reaction that involves the whole body is called anaphylaxis.  CAUSES  "True" drug allergies occur when there is an allergic reaction to a medicine. This is caused by overactivity of the immune system. First, the body becomes sensitized. The immune system is triggered by your first exposure to the medicine.  Following this first exposure, future exposure to the same medicine may be life-threatening.  Almost any medicine can cause an allergic reaction. Common ones are:  Penicillin.  Sulfonamides (sulfa drugs).  Local anesthetics.  X-ray dyes that contain iodine.  SYMPTOMS  Common symptoms of a minor allergic reaction are:  Swelling around the mouth.  An itchy red rash or hives.  Vomiting or diarrhea.  Anaphylaxis can cause swelling of the mouth and throat. This makes it difficult to breathe and swallow. Severe reactions can be fatal within seconds, even after exposure to only a trace amount of the drug that causes the reaction.  HOME CARE INSTRUCTIONS  If you are unsure of what caused your reaction, keep a diary of foods and medicines used. Include the symptoms that followed. Avoid anything that causes reactions.  You may want to follow up with an allergy specialist after the reaction has cleared in order to be tested to confirm the allergy. It is important to confirm that your reaction is an allergy, not just a side effect to the medicine. If you have a true allergy to a medicine, this may prevent that medicine and related medicines from being given to you when you are very ill.  If you have hives or a rash:  Take medicines as directed by your caregiver.  You may use an over-the-counter antihistamine (diphenhydramine) as needed.  Apply cold compresses to the skin or take baths in cool water. Avoid hot baths or showers.  If you are severely allergic:  Continuous observation after a severe reaction may be needed. Hospitalization is often required.  Wear a medical alert bracelet or necklace stating your allergy.  You and your family must learn how to use an anaphylaxis kit or give an epinephrine injection to temporarily treat an emergency allergic reaction. If you have had a severe reaction, always carry your epinephrine injection or anaphylaxis kit with you. This can be lifesaving if you have a  severe reaction.  Do not drive or perform tasks after treatment until the medicines used to treat your reaction have worn off, or until your caregiver says it is okay.  SEEK MEDICAL CARE IF:  You think you had an allergic reaction. Symptoms usually start within 30 minutes after exposure.  Symptoms are getting worse rather than better.  You develop new symptoms.  The symptoms that brought you to your caregiver return.  SEEK IMMEDIATE MEDICAL CARE IF:  You have swelling of the mouth, difficulty breathing, or wheezing.  You have a tight feeling in your chest or throat.  You develop hives, swelling, or itching all over your body.  You develop severe vomiting or diarrhea.  You feel faint or pass out.  This is an emergency. Use your epinephrine injection or anaphylaxis kit as you have been instructed. Call for emergency medical help. Even if you improve  after the injection, you need to be examined at a hospital emergency department.  MAKE SURE YOU:  Understand these instructions.  Will watch your condition.  Will get help right away if you are not doing well or get worse.

## 2012-02-13 LAB — URINE CULTURE
Colony Count: NO GROWTH
Culture  Setup Time: 201304281136
Culture: NO GROWTH

## 2012-02-27 ENCOUNTER — Telehealth: Payer: Self-pay | Admitting: Internal Medicine

## 2012-02-27 MED ORDER — BISOPROLOL-HYDROCHLOROTHIAZIDE 5-6.25 MG PO TABS: 1.0000 | ORAL_TABLET | Freq: Every day | ORAL | Status: DC

## 2012-02-27 MED ORDER — PRAVASTATIN SODIUM 40 MG PO TABS: 40.0000 mg | ORAL_TABLET | Freq: Every day | ORAL | Status: DC

## 2012-02-27 MED ORDER — BENAZEPRIL HCL 20 MG PO TABS: 20.0000 mg | ORAL_TABLET | Freq: Every day | ORAL | Status: DC

## 2012-02-27 MED ORDER — RANITIDINE HCL 300 MG PO CAPS: 300.0000 mg | ORAL_CAPSULE | Freq: Two times a day (BID) | ORAL | Status: DC

## 2012-02-27 MED ORDER — LISINOPRIL-HYDROCHLOROTHIAZIDE 20-12.5 MG PO TABS: 1.0000 | ORAL_TABLET | Freq: Every day | ORAL | Status: DC

## 2012-02-27 NOTE — Telephone Encounter (Signed)
Pt needs refills on Ranitidine 300mg , lisinopril hctz 20-12.5mg , bisoprolol hctz 5-6-25mg , benazepril hcl 20mg  and pravastatin sodium 40mg . [facility] on [address]. Pt request a call once sent

## 2012-02-29 ENCOUNTER — Other Ambulatory Visit: Payer: Self-pay | Admitting: Internal Medicine

## 2012-02-29 DIAGNOSIS — Z1231 Encounter for screening mammogram for malignant neoplasm of breast: Secondary | ICD-10-CM

## 2012-03-06 ENCOUNTER — Telehealth: Payer: Self-pay | Admitting: Internal Medicine

## 2012-03-06 NOTE — Telephone Encounter (Signed)
Received 125 pages from [facility]. Sent to Dr. Jonny Ruiz. 03/06/12 SD

## 2012-03-10 ENCOUNTER — Encounter: Payer: Self-pay | Admitting: Internal Medicine

## 2012-03-10 DIAGNOSIS — I10 Essential (primary) hypertension: Secondary | ICD-10-CM | POA: Insufficient documentation

## 2012-03-10 DIAGNOSIS — K219 Gastro-esophageal reflux disease without esophagitis: Secondary | ICD-10-CM | POA: Insufficient documentation

## 2012-03-10 DIAGNOSIS — I82409 Acute embolism and thrombosis of unspecified deep veins of unspecified lower extremity: Secondary | ICD-10-CM | POA: Insufficient documentation

## 2012-03-10 DIAGNOSIS — R202 Paresthesia of skin: Secondary | ICD-10-CM | POA: Insufficient documentation

## 2012-03-10 DIAGNOSIS — N390 Urinary tract infection, site not specified: Secondary | ICD-10-CM | POA: Insufficient documentation

## 2012-03-10 DIAGNOSIS — R51 Headache: Secondary | ICD-10-CM

## 2012-03-10 DIAGNOSIS — Z0001 Encounter for general adult medical examination with abnormal findings: Secondary | ICD-10-CM | POA: Insufficient documentation

## 2012-03-10 DIAGNOSIS — Z0000 Encounter for general adult medical examination without abnormal findings: Secondary | ICD-10-CM | POA: Insufficient documentation

## 2012-03-10 DIAGNOSIS — J309 Allergic rhinitis, unspecified: Secondary | ICD-10-CM | POA: Insufficient documentation

## 2012-03-10 DIAGNOSIS — E785 Hyperlipidemia, unspecified: Secondary | ICD-10-CM | POA: Insufficient documentation

## 2012-03-10 DIAGNOSIS — R519 Headache, unspecified: Secondary | ICD-10-CM | POA: Insufficient documentation

## 2012-03-10 DIAGNOSIS — N6019 Diffuse cystic mastopathy of unspecified breast: Secondary | ICD-10-CM | POA: Insufficient documentation

## 2012-03-10 DIAGNOSIS — D6852 Prothrombin gene mutation: Secondary | ICD-10-CM | POA: Insufficient documentation

## 2012-03-10 DIAGNOSIS — R001 Bradycardia, unspecified: Secondary | ICD-10-CM | POA: Insufficient documentation

## 2012-03-10 DIAGNOSIS — R009 Unspecified abnormalities of heart beat: Secondary | ICD-10-CM | POA: Insufficient documentation

## 2012-03-10 DIAGNOSIS — I4891 Unspecified atrial fibrillation: Secondary | ICD-10-CM | POA: Insufficient documentation

## 2012-03-10 DIAGNOSIS — R42 Dizziness and giddiness: Secondary | ICD-10-CM | POA: Insufficient documentation

## 2012-04-04 ENCOUNTER — Other Ambulatory Visit (INDEPENDENT_AMBULATORY_CARE_PROVIDER_SITE_OTHER): Payer: Medicare Other

## 2012-04-04 ENCOUNTER — Ambulatory Visit (INDEPENDENT_AMBULATORY_CARE_PROVIDER_SITE_OTHER): Payer: Medicare Other | Admitting: Internal Medicine

## 2012-04-04 ENCOUNTER — Encounter: Payer: Self-pay | Admitting: Internal Medicine

## 2012-04-04 ENCOUNTER — Telehealth: Payer: Self-pay | Admitting: Internal Medicine

## 2012-04-04 VITALS — BP 162/102 | HR 79 | Temp 98.2°F | Ht 65.0 in | Wt 173.1 lb

## 2012-04-04 DIAGNOSIS — Z0000 Encounter for general adult medical examination without abnormal findings: Secondary | ICD-10-CM

## 2012-04-04 DIAGNOSIS — Z79899 Other long term (current) drug therapy: Secondary | ICD-10-CM

## 2012-04-04 DIAGNOSIS — I495 Sick sinus syndrome: Secondary | ICD-10-CM

## 2012-04-04 HISTORY — DX: Sick sinus syndrome: I49.5

## 2012-04-04 LAB — CBC WITH DIFFERENTIAL/PLATELET
Basophils Absolute: 0.1 10*3/uL (ref 0.0–0.1)
HCT: 37.9 % (ref 36.0–46.0)
Lymphs Abs: 1.7 10*3/uL (ref 0.7–4.0)
Monocytes Absolute: 0.7 10*3/uL (ref 0.1–1.0)
Monocytes Relative: 8.7 % (ref 3.0–12.0)
Platelets: 305 10*3/uL (ref 150.0–400.0)
RDW: 12.5 % (ref 11.5–14.6)

## 2012-04-04 LAB — TSH: TSH: 3.19 u[IU]/mL (ref 0.35–5.50)

## 2012-04-04 LAB — BASIC METABOLIC PANEL
CO2: 27 mEq/L (ref 19–32)
Chloride: 97 mEq/L (ref 96–112)
GFR: 60.19 mL/min (ref >60.00)
Glucose, Bld: 106 mg/dL — ABNORMAL HIGH (ref 70–99)
Potassium: 3.9 mEq/L (ref 3.5–5.1)
Sodium: 133 mEq/L — ABNORMAL LOW (ref 135–145)

## 2012-04-04 LAB — HEPATIC FUNCTION PANEL
ALT: 23 U/L (ref 0–35)
AST: 30 U/L (ref 0–37)
Albumin: 4.1 g/dL (ref 3.5–5.2)
Alkaline Phosphatase: 63 U/L (ref 39–117)

## 2012-04-04 LAB — URINALYSIS, ROUTINE W REFLEX MICROSCOPIC
Bilirubin Urine: NEGATIVE
Ketones, ur: NEGATIVE
Urobilinogen, UA: 0.2 (ref 0.0–1.0)

## 2012-04-04 LAB — LIPID PANEL: VLDL: 29 mg/dL (ref 0.0–40.0)

## 2012-04-04 MED ORDER — BENAZEPRIL HCL 20 MG PO TABS: 20.0000 mg | ORAL_TABLET | Freq: Every evening | ORAL | Status: DC | PRN

## 2012-04-04 MED ORDER — BISOPROLOL-HYDROCHLOROTHIAZIDE 5-6.25 MG PO TABS: 1.0000 | ORAL_TABLET | Freq: Every day | ORAL | Status: DC

## 2012-04-04 MED ORDER — PRAVASTATIN SODIUM 40 MG PO TABS: 40.0000 mg | ORAL_TABLET | Freq: Every day | ORAL | Status: DC

## 2012-04-04 MED ORDER — RANITIDINE HCL 300 MG PO CAPS: 300.0000 mg | ORAL_CAPSULE | Freq: Two times a day (BID) | ORAL | Status: DC

## 2012-04-04 MED ORDER — LISINOPRIL-HYDROCHLOROTHIAZIDE 20-12.5 MG PO TABS: 1.0000 | ORAL_TABLET | Freq: Every day | ORAL | Status: DC
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
## 2012-04-04 NOTE — Telephone Encounter (Signed)
Sorry, labs now ordered

## 2012-04-04 NOTE — Patient Instructions (Addendum)
Continue all other medications as before All of your medications were refilled to the pharmacy today; no changes made at this time Please go to LAB in the Basement for the blood and/or urine tests to be done today You will be contacted by phone if any changes need to be made immediately.  Otherwise, you will receive a letter about your results with an explanation. You are otherwise up to date with prevention at this time We will try to obtain further recent records per [facility], Dr Terrace Arabia, and Dr Katrinka Blazing Please return in 6 months, or sooner if needed

## 2012-04-04 NOTE — Telephone Encounter (Signed)
Message copied by Corwin Levins on Wed Apr 04, 2012 11:59 AM ------      Message from: Scharlene Gloss B      Created: Wed Apr 04, 2012 10:50 AM       Labs were not ordered for this patient

## 2012-04-08 ENCOUNTER — Encounter: Payer: Self-pay | Admitting: Internal Medicine

## 2012-04-08 NOTE — Assessment & Plan Note (Signed)

## 2012-06-12 ENCOUNTER — Encounter: Payer: Self-pay | Admitting: Gastroenterology

## 2012-07-03 ENCOUNTER — Encounter: Payer: Self-pay | Admitting: Gastroenterology

## 2012-07-04 ENCOUNTER — Telehealth: Payer: Self-pay

## 2012-07-04 NOTE — Telephone Encounter (Signed)
The patient called to informed her pharmacy would not fill her benezapril as stated it was similar to Lisinopril. The pharmacy was questioning if she should be on both BP meds?  Please advise call back number is [phone]

## 2012-07-04 NOTE — Telephone Encounter (Signed)
Patient notified/LMOVM 

## 2012-07-04 NOTE — Telephone Encounter (Signed)
Ok to not take the benazepril  Continue all other medications as before

## 2012-07-19 ENCOUNTER — Ambulatory Visit (HOSPITAL_COMMUNITY): Payer: Medicare Other

## 2012-07-23 ENCOUNTER — Telehealth: Payer: Self-pay

## 2012-07-23 NOTE — Telephone Encounter (Signed)
Patient informed of MD instructions.  The patient agreed to schedule appt. Tomorrow.

## 2012-07-23 NOTE — Telephone Encounter (Signed)
The patient called informing she wakes sometimes at night SOB.  Also has had some nausea, no fever and some pressure between her eyes.  Please advise

## 2012-07-23 NOTE — Telephone Encounter (Signed)
Not sure what to say just based on this, as could be asthma or even fluid in the lungs; please make ROV

## 2012-07-24 ENCOUNTER — Ambulatory Visit: Payer: Medicare Other | Admitting: Internal Medicine

## 2012-07-24 DIAGNOSIS — Z0289 Encounter for other administrative examinations: Secondary | ICD-10-CM

## 2012-07-26 ENCOUNTER — Encounter: Payer: Self-pay | Admitting: *Deleted

## 2012-07-26 ENCOUNTER — Ambulatory Visit (INDEPENDENT_AMBULATORY_CARE_PROVIDER_SITE_OTHER): Payer: Medicare Other | Admitting: General Practice

## 2012-07-26 DIAGNOSIS — Z23 Encounter for immunization: Secondary | ICD-10-CM

## 2012-07-26 NOTE — Progress Notes (Addendum)
Patient ID: [name], female   DOB: 1946-02-01, 66 y.o.   MRN: [id number]  Pt here for PV for recall colonoscopy scheduled 08/10/2012.  Pt says she is scheduled for stress test next week.  Has been having shortness of breath and chest pain.  Dr. Verdis Prime does not want pt to have procedure until after results of stress test.  She will call to reschedule after she has cardiac clearance for procedure.

## 2012-07-26 NOTE — Progress Notes (Signed)
Agree she needs to complete her cardiac testing and needs cardiac clearance.

## 2012-07-26 NOTE — Telephone Encounter (Signed)
error 

## 2012-08-03 ENCOUNTER — Ambulatory Visit (HOSPITAL_COMMUNITY)
Admission: RE | Admit: 2012-08-03 | Discharge: 2012-08-03 | Disposition: A | Discharge status: Home / Self Care | Payer: Medicare Other | Source: Ambulatory Visit | Attending: Internal Medicine | Admitting: Internal Medicine

## 2012-08-03 DIAGNOSIS — Z1231 Encounter for screening mammogram for malignant neoplasm of breast: Secondary | ICD-10-CM

## 2012-08-10 ENCOUNTER — Encounter: Payer: Medicare Other | Admitting: Gastroenterology

## 2012-08-13 ENCOUNTER — Telehealth: Payer: Self-pay | Admitting: Internal Medicine

## 2012-08-13 MED ORDER — ALPRAZOLAM 0.25 MG PO TABS: 0.2500 mg | ORAL_TABLET | Freq: Two times a day (BID) | ORAL | Status: DC | PRN

## 2012-08-13 NOTE — Telephone Encounter (Signed)
Caller: Harshini/Patient; Patient Name: [name]; PCP: Oliver Barre (Adults only); Best Callback Phone Number: [phone] Vivika calling to see if the Cardiologist sent Dr. Jonny Ruiz her report from her recent Stress Test.  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  States she took some classes approximately 3 weeks ago and got very nervous which caused her BP to go up.  Is asymptomatic now but requesting for something to be called in for anxiety prior to her classes next week.  Classes are on 08/21/12 and 08/22/12.  [facility] [phone].  OFFICE, PLEASE F/U WITH Cristin REGARDING SCRIPT REQUEST.

## 2012-08-13 NOTE — Telephone Encounter (Signed)
I dont recall or have her recent cardiology stress test or other information;  But please have her give the name, and we can call for records to add to [facility]  Also ok for anxiety med - Done hardcopy to robin

## 2012-08-14 NOTE — Telephone Encounter (Signed)
Ok for robin to request records from Dr Katrinka Blazing office (he is on vacation this wk and may have been overlooked), then close note

## 2012-08-14 NOTE — Telephone Encounter (Signed)
Faxed hardcopy to [facility].  Called the patient informed and her cardiologist is Dr. Verdis Prime, she stated they are sending her results.

## 2012-08-14 NOTE — Telephone Encounter (Signed)
Called Dr. Michaelle Copas office and requested copy of most recent stress test.

## 2012-08-15 ENCOUNTER — Other Ambulatory Visit: Payer: Self-pay | Admitting: Internal Medicine

## 2012-08-29 ENCOUNTER — Telehealth: Payer: Self-pay

## 2012-08-29 NOTE — Telephone Encounter (Signed)
Called patient and told her that we received clearance from her Cardiologist that she can go ahead and schedule her Recall Colonoscopy. Pt agreed and states she will call back the first of the year to schedule after the holidays. Clearance letter to be scanned into Epic.

## 2012-08-31 ENCOUNTER — Encounter: Payer: Self-pay | Admitting: Gastroenterology

## 2012-10-04 ENCOUNTER — Encounter: Payer: Self-pay | Admitting: Internal Medicine

## 2012-10-04 ENCOUNTER — Ambulatory Visit (INDEPENDENT_AMBULATORY_CARE_PROVIDER_SITE_OTHER): Payer: Medicare Other | Admitting: Internal Medicine

## 2012-10-04 VITALS — BP 110/70 | HR 61 | Temp 97.6°F | Ht 64.0 in | Wt 171.5 lb

## 2012-10-04 DIAGNOSIS — I10 Essential (primary) hypertension: Secondary | ICD-10-CM

## 2012-10-04 DIAGNOSIS — Z0000 Encounter for general adult medical examination without abnormal findings: Secondary | ICD-10-CM

## 2012-10-04 DIAGNOSIS — E785 Hyperlipidemia, unspecified: Secondary | ICD-10-CM

## 2012-10-04 DIAGNOSIS — R259 Unspecified abnormal involuntary movements: Secondary | ICD-10-CM

## 2012-10-04 NOTE — Patient Instructions (Addendum)
Continue all other medications as before Please have the pharmacy call with any other refills you may need. You will be contacted regarding the referral for: neurology for the tremor Please remember to followup with your GYN for the yearly pap smear and/or mammogram - to consider [facility] Please remember to sign up for My Chart at your earliest convenience, as this will be important to you in the future with finding out test results. Please return in 6 mo with Lab testing done 3-5 days before

## 2012-10-04 NOTE — Assessment & Plan Note (Signed)
With ? Worsening head tremor on topamax, for neuro referral
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
## 2012-10-06 ENCOUNTER — Encounter: Payer: Self-pay | Admitting: Internal Medicine

## 2012-10-06 NOTE — Assessment & Plan Note (Signed)
stable overall by hx and exam, most recent data reviewed with pt, and pt to continue medical treatment as before Lab Results  Component Value Date   LDLCALC 74 04/04/2012

## 2012-10-06 NOTE — Assessment & Plan Note (Signed)
stable overall by hx and exam, most recent data reviewed with pt, and pt to continue medical treatment as before BP Readings from Last 3 Encounters:  10/04/12 110/70  04/04/12 162/102  02/12/12 113/41

## 2012-10-19 ENCOUNTER — Ambulatory Visit (AMBULATORY_SURGERY_CENTER): Payer: Medicare Other | Admitting: *Deleted

## 2012-10-19 VITALS — Ht 66.0 in | Wt 170.4 lb

## 2012-10-19 DIAGNOSIS — Z1211 Encounter for screening for malignant neoplasm of colon: Secondary | ICD-10-CM

## 2012-10-19 MED ORDER — MOVIPREP 100 G PO SOLR: ORAL | Status: DC

## 2012-11-02 ENCOUNTER — Ambulatory Visit (AMBULATORY_SURGERY_CENTER): Payer: Medicare Other | Admitting: Gastroenterology

## 2012-11-02 ENCOUNTER — Encounter: Payer: Self-pay | Admitting: Gastroenterology

## 2012-11-02 VITALS — BP 133/67 | HR 62 | Temp 97.6°F | Resp 17 | Ht 66.0 in | Wt 170.0 lb

## 2012-11-02 DIAGNOSIS — Z8371 Family history of colonic polyps: Secondary | ICD-10-CM

## 2012-11-02 DIAGNOSIS — Z800 Family history of malignant neoplasm of digestive organs: Secondary | ICD-10-CM

## 2012-11-02 DIAGNOSIS — Z1211 Encounter for screening for malignant neoplasm of colon: Secondary | ICD-10-CM

## 2012-11-02 DIAGNOSIS — Z86010 Personal history of colonic polyps: Secondary | ICD-10-CM

## 2012-11-02 MED ORDER — SODIUM CHLORIDE 0.9 % IV SOLN: 500.0000 mL | INTRAVENOUS | Status: DC

## 2012-11-02 NOTE — Patient Instructions (Signed)
YOU HAD AN ENDOSCOPIC PROCEDURE TODAY AT THE [facility]: Refer to the procedure report that was given to you for any specific questions about what was found during the examination.  If the procedure report does not answer your questions, please call your gastroenterologist to clarify.  If you requested that your care partner not be given the details of your procedure findings, then the procedure report has been included in a sealed envelope for you to review at your convenience later.  YOU SHOULD EXPECT: Some feelings of bloating in the abdomen. Passage of more gas than usual.  Walking can help get rid of the air that was put into your GI tract during the procedure and reduce the bloating. If you had a lower endoscopy (such as a colonoscopy or flexible sigmoidoscopy) you may notice spotting of blood in your stool or on the toilet paper. If you underwent a bowel prep for your procedure, then you may not have a normal bowel movement for a few days.  DIET: Your first meal following the procedure should be a light meal and then it is ok to progress to your normal diet.  A half-sandwich or bowl of soup is an example of a good first meal.  Heavy or fried foods are harder to digest and may make you feel nauseous or bloated.  Likewise meals heavy in dairy and vegetables can cause extra gas to form and this can also increase the bloating.  Drink plenty of fluids but you should avoid alcoholic beverages for 24 hours.  ACTIVITY: Your care partner should take you home directly after the procedure.  You should plan to take it easy, moving slowly for the rest of the day.  You can resume normal activity the day after the procedure however you should NOT DRIVE or use heavy machinery for 24 hours (because of the sedation medicines used during the test).    SYMPTOMS TO REPORT IMMEDIATELY: A gastroenterologist can be reached at any hour.  During normal business hours, 8:30 AM to 5:00 PM Monday through Friday,  call [phone].  After hours and on weekends, please call the GI answering service at [phone] who will take a message and have the physician on call contact you.   Following lower endoscopy (colonoscopy or flexible sigmoidoscopy):  Excessive amounts of blood in the stool  Significant tenderness or worsening of abdominal pains  Swelling of the abdomen that is new, acute  Fever of 100F or higher    FOLLOW UP: If any biopsies were taken you will be contacted by phone or by letter within the next 1-3 weeks.  Call your gastroenterologist if you have not heard about the biopsies in 3 weeks.  Our staff will call the home number listed on your records the next business day following your procedure to check on you and address any questions or concerns that you may have at that time regarding the information given to you following your procedure. This is a courtesy call and so if there is no answer at the home number and we have not heard from you through the emergency physician on call, we will assume that you have returned to your regular daily activities without incident.  SIGNATURES/CONFIDENTIALITY: You and/or your care partner have signed paperwork which will be entered into your electronic medical record.  These signatures attest to the fact that that the information above on your After Visit Summary has been reviewed and is understood.  Full responsibility of the confidentiality   of this discharge information lies with you and/or your care-partner.     

## 2012-11-02 NOTE — Op Note (Signed)
[facility] [address] [place], [address]   COLONOSCOPY PROCEDURE REPORT  PATIENT: Ashley, Wolfe  MR#: [id number] BIRTHDATE: [birth year] , 66  yrs. old GENDER: Female ENDOSCOPIST: Meryl Dare, MD, [facility] PROCEDURE DATE:  11/02/2012 PROCEDURE:   Colonoscopy, screening ASA CLASS:   Class II INDICATIONS:Patient's personal history of adenomatous colon polyps, Patient's family history of colon polyps, and Patient's family history of colon cancer, distant relative. MEDICATIONS: MAC sedation, administered by CRNA and propofol (Diprivan) 150mg  IV DESCRIPTION OF PROCEDURE:   After the risks benefits and alternatives of the procedure were thoroughly explained, informed consent was obtained.  A digital rectal exam revealed no abnormalities of the rectum.   The LB [id number]  endoscope was introduced through the anus and advanced to the cecum, which was identified by both the appendix and ileocecal valve. No adverse events experienced.   The quality of the prep was excellent, using MoviPrep  The instrument was then slowly withdrawn as the colon was fully examined.  COLON FINDINGS: A normal appearing cecum, ileocecal valve, and appendiceal orifice were identified.  The ascending, hepatic flexure, transverse, splenic flexure, descending, sigmoid colon and rectum appeared unremarkable.  No polyps or cancers were seen. Retroflexed views revealed no abnormalities. The time to cecum=3 minutes 30 seconds.  Withdrawal time=9 minutes 30 seconds.  The scope was withdrawn and the procedure completed.  COMPLICATIONS: There were no complications.  ENDOSCOPIC IMPRESSION: 1.  Normal colon  RECOMMENDATIONS: 1.  Repeat Colonoscopy in 5 years.   eSigned:  Meryl Dare, MD, [facility] 11/02/2012 10:05 AM

## 2012-11-02 NOTE — Progress Notes (Signed)
Patient did not experience any of the following events: a burn prior to discharge; a fall within the facility; wrong site/side/patient/procedure/implant event; or a hospital transfer or hospital admission upon discharge from the facility. (G8907) Patient did not have preoperative order for IV antibiotic SSI prophylaxis. (G8918)  

## 2012-11-05 ENCOUNTER — Telehealth: Payer: Self-pay | Admitting: *Deleted

## 2012-11-05 NOTE — Telephone Encounter (Signed)
  Follow up Call-  Call back number 11/02/2012  Post procedure Call Back phone  # [phone]  Permission to leave phone message No     Patient questions:  Do you have a fever, pain , or abdominal swelling? no Pain Score  0 *  Have you tolerated food without any problems? yes  Have you been able to return to your normal activities? yes  Do you have any questions about your discharge instructions: Diet   no Medications  no Follow up visit  no  Do you have questions or concerns about your Care? no  Actions: * If pain score is 4 or above: No action needed, pain <4.

## 2013-04-04 ENCOUNTER — Other Ambulatory Visit (INDEPENDENT_AMBULATORY_CARE_PROVIDER_SITE_OTHER): Payer: Medicare Other

## 2013-04-04 ENCOUNTER — Ambulatory Visit (INDEPENDENT_AMBULATORY_CARE_PROVIDER_SITE_OTHER): Payer: Medicare Other | Admitting: Internal Medicine

## 2013-04-04 ENCOUNTER — Encounter: Payer: Self-pay | Admitting: Internal Medicine

## 2013-04-04 VITALS — BP 152/100 | HR 73 | Temp 97.8°F | Ht 65.0 in | Wt 166.5 lb

## 2013-04-04 DIAGNOSIS — E785 Hyperlipidemia, unspecified: Secondary | ICD-10-CM

## 2013-04-04 DIAGNOSIS — M899 Disorder of bone, unspecified: Secondary | ICD-10-CM

## 2013-04-04 DIAGNOSIS — Z0000 Encounter for general adult medical examination without abnormal findings: Secondary | ICD-10-CM

## 2013-04-04 DIAGNOSIS — I10 Essential (primary) hypertension: Secondary | ICD-10-CM

## 2013-04-04 DIAGNOSIS — M8580 Other specified disorders of bone density and structure, unspecified site: Secondary | ICD-10-CM

## 2013-04-04 DIAGNOSIS — M949 Disorder of cartilage, unspecified: Secondary | ICD-10-CM

## 2013-04-04 LAB — BASIC METABOLIC PANEL
Chloride: 97 mEq/L (ref 96–112)
Creatinine, Ser: 1.1 mg/dL (ref 0.4–1.2)
Potassium: 3.7 mEq/L (ref 3.5–5.1)

## 2013-04-04 LAB — URINALYSIS, ROUTINE W REFLEX MICROSCOPIC
Bilirubin Urine: NEGATIVE
Hgb urine dipstick: NEGATIVE
Ketones, ur: NEGATIVE
Total Protein, Urine: NEGATIVE
Urine Glucose: NEGATIVE

## 2013-04-04 LAB — HEPATIC FUNCTION PANEL
ALT: 18 U/L (ref 0–35)
AST: 23 U/L (ref 0–37)
Bilirubin, Direct: 0.1 mg/dL (ref 0.0–0.3)
Total Bilirubin: 0.5 mg/dL (ref 0.3–1.2)

## 2013-04-04 LAB — LIPID PANEL
LDL Cholesterol: 79 mg/dL (ref 0–99)
Total CHOL/HDL Ratio: 4

## 2013-04-04 MED ORDER — CITALOPRAM HYDROBROMIDE 10 MG PO TABS: 10.0000 mg | ORAL_TABLET | Freq: Every day | ORAL | Status: DC

## 2013-04-04 NOTE — Patient Instructions (Addendum)
Please take all new medication as prescribed - the nerve medication (citalopram 10 mg per day, and remember it takes about 3-4 wks to get the full effect) Please continue all other medications as before Please have the pharmacy call with any other refills you may need. Please continue your efforts at being more active, low cholesterol diet, and weight control. You are otherwise up to date with prevention measures today. Please schedule the bone density test before leaving today at the scheduling desk (where you check out) Please go to the LAB in the Basement (turn left off the elevator) for the tests to be done today You will be contacted by phone if any changes need to be made immediately.  Otherwise, you will receive a letter about your results with an explanation, but please check with MyChart first.  Please remember to sign up for My Chart if you have not done so, as this will be important to you in the future with finding out test results, communicating by private email, and scheduling acute appointments online when needed.  Please return in 6 months, or sooner if needed, to re-check the blood pressure

## 2013-04-04 NOTE — Assessment & Plan Note (Signed)
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
## 2013-04-05 ENCOUNTER — Encounter: Payer: Self-pay | Admitting: Internal Medicine

## 2013-04-05 LAB — CBC WITH DIFFERENTIAL/PLATELET
Basophils Relative: 0.5 % (ref 0.0–3.0)
Eosinophils Absolute: 0.2 10*3/uL (ref 0.0–0.7)
HCT: 38.1 % (ref 36.0–46.0)
Lymphs Abs: 2.5 10*3/uL (ref 0.7–4.0)
MCHC: 34 g/dL (ref 30.0–36.0)
MCV: 92.3 fl (ref 78.0–100.0)
Monocytes Absolute: 0.6 10*3/uL (ref 0.1–1.0)
Neutro Abs: 8.9 10*3/uL — ABNORMAL HIGH (ref 1.4–7.7)
Neutrophils Relative %: 72 % (ref 43.0–77.0)
RBC: 4.13 Mil/uL (ref 3.87–5.11)

## 2013-04-10 ENCOUNTER — Telehealth: Payer: Self-pay | Admitting: *Deleted

## 2013-04-10 NOTE — Telephone Encounter (Signed)
Pt called states the Citalopram is making her more shaky than before and she is also feeling different.  Her first dose was on 6.19.2014.  Please advise pt,

## 2013-04-10 NOTE — Telephone Encounter (Signed)
Patient informed. 

## 2013-04-10 NOTE — Telephone Encounter (Signed)
This usually passes in a few days, I would encourage to try to continue for now

## 2013-04-12 ENCOUNTER — Ambulatory Visit (INDEPENDENT_AMBULATORY_CARE_PROVIDER_SITE_OTHER): Payer: Medicare Other | Admitting: Neurology

## 2013-04-12 ENCOUNTER — Encounter: Payer: Self-pay | Admitting: Neurology

## 2013-04-12 VITALS — BP 142/79 | HR 81 | Ht 66.0 in | Wt 165.0 lb

## 2013-04-12 DIAGNOSIS — R259 Unspecified abnormal involuntary movements: Secondary | ICD-10-CM

## 2013-04-12 MED ORDER — TOPIRAMATE 50 MG PO TABS: 50.0000 mg | ORAL_TABLET | Freq: Two times a day (BID) | ORAL | Status: DC

## 2013-04-12 NOTE — Progress Notes (Signed)
History of Present Illness:   [name] is a 67 year old Caucasian female   has history of headaches,  paresthesias and mild essential tremor following up for head titubation    She retired from work as a Lawyer in 2011 and has noted decrease in her headaches since that time. She reports that her tremor and headaches are very intermittent  She had a history of gradual onset of head itubation for many years,mild bilateral hand tremor, she denied loss sense of smell, no REM sleep disorder, no constipation, no significant gait difficulty  She now complains worsening bilateral hands shaking, especially at nighttime, she also complains of leg umpy movement, woke her up from sleep, previously responded well to Topamax,  she is also taking clonazepam as needed, only rarely take it,  Her mother suffered tremor in her  70s  Physical Exam  General: well developed, well nourished female, seated on exam table, in no evident distress Head: head  normocephalic and atraumatic.  Ears, Nose and Throat: oropharynx benign Neck: supple no carotid bruits Respiratory: clear to auscultation bilaterally Cardiovascular: regular rate rhythm  Neurologic Exam  Mental Status: pleasant, awake, alert, cooperative to history, talking, and casual conversation. Mild left tilt, right turn, left shoulder elevation, mild head No-No titubation Cranial Nerves: CN II-XII pupils were equal round reactive to light.  Fundi were sharp bilaterally.  Extraocular movements were full.  Visual fields were full on confrontational test.  Facial sensation and strength were normal.  Hearing was intact to finger rubbing bilaterally.  Uvula tongue were midline.  Head turning and shoulder shrugging were normal and symmetric.  Tongue protrusion into the cheeks strength were normal.  Motor: mild constant head titubation, slight bilateral hands postural tremor, no rigidity,  Sensory: Normal to light touch, pinprick, proprioception, and vibratory  sensation. Coordination: Normal finger-to-nose, heel-to-shin.  There was no dysmetria noticed. Gait and Station: Narrow based and steady, was able to perform tiptoe, heel, and tandem walking without difficulty.  Romberg sign: Negative. No decreased armswing Reflexes: Deep tendon reflexes: Biceps: 2/2, Brachioradialis: 2/2, Triceps: 2/2, Pateller: 2/2, Achilles: 2/2.  Plantar responses are flexor.   Assessment and Plan: [name] is a 67 year old Caucasian female with history of headaches, essential tremor, presenting with mild head titubation, cervical dystonia, complains of leg jerking when sleep, responded to Topamax 100mg  daily in divided dose.  PLAN:  1. Continue topamax,  50mg  BID, RX given. 2. essential tremor, may be a component of periodic leg movement disorder , clonazepam as needed. 3. She will continue her refill by primary care physician, return to clinic as needed.

## 2013-04-23 ENCOUNTER — Other Ambulatory Visit: Payer: Medicare Other

## 2013-04-29 ENCOUNTER — Ambulatory Visit (INDEPENDENT_AMBULATORY_CARE_PROVIDER_SITE_OTHER): Payer: Medicare Other | Admitting: Internal Medicine

## 2013-04-29 ENCOUNTER — Encounter: Payer: Self-pay | Admitting: Internal Medicine

## 2013-04-29 VITALS — BP 120/80 | HR 72 | Temp 98.1°F | Ht 65.0 in | Wt 164.2 lb

## 2013-04-29 DIAGNOSIS — M79602 Pain in left arm: Secondary | ICD-10-CM | POA: Insufficient documentation

## 2013-04-29 DIAGNOSIS — M79609 Pain in unspecified limb: Secondary | ICD-10-CM

## 2013-04-29 DIAGNOSIS — M7989 Other specified soft tissue disorders: Secondary | ICD-10-CM

## 2013-04-29 DIAGNOSIS — I10 Essential (primary) hypertension: Secondary | ICD-10-CM

## 2013-04-29 NOTE — Assessment & Plan Note (Signed)
C/w post injury,mild, declines further pain med for now

## 2013-04-29 NOTE — Assessment & Plan Note (Signed)
stable overall by history and exam, recent data reviewed with pt, and pt to continue medical treatment as before,  to f/u any worsening symptoms or concerns BP Readings from Last 3 Encounters:  04/29/13 120/80  04/12/13 142/79  04/04/13 152/100
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
## 2013-04-29 NOTE — Patient Instructions (Signed)
Please continue all other medications as before, and refills have been done if requested.  You will be contacted regarding the referral for: Left Upper Extremity Venous Doppler - to see [facility] now  Please remember to sign up for My Chart if you have not done so, as this will be important to you in the future with finding out test results, communicating by private email, and scheduling acute appointments online when needed.

## 2013-04-29 NOTE — Assessment & Plan Note (Signed)
?   Local hematoma, cant r/o dvt LUE - for LUE venous doppler

## 2013-04-30 ENCOUNTER — Encounter (INDEPENDENT_AMBULATORY_CARE_PROVIDER_SITE_OTHER): Payer: Medicare Other

## 2013-04-30 DIAGNOSIS — M7989 Other specified soft tissue disorders: Secondary | ICD-10-CM

## 2013-04-30 DIAGNOSIS — M79602 Pain in left arm: Secondary | ICD-10-CM

## 2013-04-30 DIAGNOSIS — M79609 Pain in unspecified limb: Secondary | ICD-10-CM

## 2013-05-11 ENCOUNTER — Other Ambulatory Visit: Payer: Self-pay | Admitting: Internal Medicine

## 2013-06-10 ENCOUNTER — Telehealth: Payer: Self-pay | Admitting: Neurology

## 2013-06-11 ENCOUNTER — Telehealth: Payer: Self-pay | Admitting: *Deleted

## 2013-06-11 DIAGNOSIS — R259 Unspecified abnormal involuntary movements: Secondary | ICD-10-CM

## 2013-06-11 NOTE — Telephone Encounter (Signed)
Pt did see Dr Terrace Arabia June 2014, was felt to need f/u as needed  Does pt want a new neurologist, or if she feels she is worsening or changed, she may be able to call Dr Zannie Cove office for a followup appt on her own

## 2013-06-11 NOTE — Telephone Encounter (Signed)
Pt called requesting a referral to Neurology.  Please advise

## 2013-06-12 ENCOUNTER — Other Ambulatory Visit: Payer: Self-pay | Admitting: Internal Medicine

## 2013-06-12 NOTE — Telephone Encounter (Signed)
Referral done

## 2013-06-12 NOTE — Telephone Encounter (Signed)
The patient would like a referral to a different neurologist.  Her symptoms have not worsened, but Dr. Zannie Cove office never returned her call so please refer to a new neurologist

## 2013-06-14 NOTE — Telephone Encounter (Signed)
Patient states when she came home from church Sunday she took a nap then she woke up  shaking all over. Patient states she laid there until it past. On Monday the patient states her head didn't feel right . This has happened sometime before last year. Patient suffer from tremors and is being treated with Topamax 50 mg twice a day which seems to help to help the funny feelings in her head.vit also helps with tremors also. Patient states the last time she she Dr.Yan she states she would put her on Botox if she got worse would like to talk to Dr.Yan about this. Best contact [phone]

## 2013-06-14 NOTE — Telephone Encounter (Signed)
I have called and left message at her cell, and home phone

## 2013-06-18 NOTE — Telephone Encounter (Signed)
Please check on to see if she is better.

## 2013-06-20 ENCOUNTER — Ambulatory Visit: Payer: Medicare Other | Admitting: Neurology

## 2013-06-20 ENCOUNTER — Encounter: Payer: Self-pay | Admitting: Neurology

## 2013-06-20 ENCOUNTER — Ambulatory Visit (INDEPENDENT_AMBULATORY_CARE_PROVIDER_SITE_OTHER): Payer: Medicare Other | Admitting: Neurology

## 2013-06-20 VITALS — BP 118/74 | HR 64 | Temp 98.1°F | Resp 20 | Ht 65.0 in | Wt 162.0 lb

## 2013-06-20 DIAGNOSIS — G243 Spasmodic torticollis: Secondary | ICD-10-CM

## 2013-06-20 DIAGNOSIS — R519 Headache, unspecified: Secondary | ICD-10-CM | POA: Insufficient documentation

## 2013-06-20 DIAGNOSIS — R42 Dizziness and giddiness: Secondary | ICD-10-CM

## 2013-06-20 DIAGNOSIS — R51 Headache: Secondary | ICD-10-CM

## 2013-06-20 DIAGNOSIS — R292 Abnormal reflex: Secondary | ICD-10-CM

## 2013-06-20 NOTE — Patient Instructions (Addendum)
Your MRI's are scheduled for Friday, Sept 12 at 3:00pm at [facility] [address]  [phone]  We will start working on the Botox prior authorization.  We will plan on doing the botox on Sept 22nd at 10:00am.
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
## 2013-06-28 ENCOUNTER — Ambulatory Visit
Admission: RE | Admit: 2013-06-28 | Discharge: 2013-06-28 | Disposition: A | Discharge status: Home / Self Care | Payer: Medicare Other | Source: Ambulatory Visit | Attending: Neurology | Admitting: Neurology

## 2013-06-28 DIAGNOSIS — R51 Headache: Secondary | ICD-10-CM

## 2013-06-28 DIAGNOSIS — R42 Dizziness and giddiness: Secondary | ICD-10-CM

## 2013-06-28 DIAGNOSIS — R292 Abnormal reflex: Secondary | ICD-10-CM

## 2013-07-04 ENCOUNTER — Telehealth: Payer: Self-pay | Admitting: Neurology

## 2013-07-04 NOTE — Telephone Encounter (Signed)
MRI results back yet, please call patient / Ashley S.

## 2013-07-04 NOTE — Telephone Encounter (Signed)
Brain looks good.  Little arthritis with small disc protrusion in neck but nothing to worry about or need surgery for

## 2013-07-05 NOTE — Telephone Encounter (Signed)
Called pt and relayed your message. She said she will call back after christmas to schedule a follow up appointment.

## 2013-07-05 NOTE — Telephone Encounter (Signed)
Left message for patient to call us if she is continuing to have the tremors, and that we were just checking on her.

## 2013-07-08 ENCOUNTER — Ambulatory Visit: Payer: Medicare Other | Admitting: Neurology

## 2013-07-09 ENCOUNTER — Telehealth: Payer: Self-pay | Admitting: Neurology

## 2013-07-09 NOTE — Telephone Encounter (Signed)
Sarah, please call her sister back and find out what we can help her with  BUT I don't think that you have a medical release for sister (you can check) so YOU cannot release information to her.

## 2013-07-09 NOTE — Telephone Encounter (Signed)
I called [name] back and told her she would have to come by the office and sign a release in order for [place] to be able to give information about her medical condition to her sister. She said she will come by later this week to sign the form.

## 2013-07-11 ENCOUNTER — Other Ambulatory Visit: Payer: Self-pay | Admitting: Internal Medicine

## 2013-07-11 ENCOUNTER — Other Ambulatory Visit: Payer: Self-pay

## 2013-07-11 DIAGNOSIS — Z1231 Encounter for screening mammogram for malignant neoplasm of breast: Secondary | ICD-10-CM

## 2013-07-11 MED ORDER — TOPIRAMATE 50 MG PO TABS: 50.0000 mg | ORAL_TABLET | Freq: Two times a day (BID) | ORAL | Status: DC

## 2013-07-11 NOTE — Telephone Encounter (Signed)
Pharmacy requests 90 day Rx  

## 2013-07-17 ENCOUNTER — Encounter: Payer: Self-pay | Admitting: Internal Medicine

## 2013-07-17 ENCOUNTER — Ambulatory Visit (INDEPENDENT_AMBULATORY_CARE_PROVIDER_SITE_OTHER): Payer: Medicare Other | Admitting: Internal Medicine

## 2013-07-17 VITALS — BP 118/70 | HR 79 | Temp 98.4°F | Ht 65.0 in | Wt 164.5 lb

## 2013-07-17 DIAGNOSIS — R002 Palpitations: Secondary | ICD-10-CM | POA: Insufficient documentation

## 2013-07-17 DIAGNOSIS — I10 Essential (primary) hypertension: Secondary | ICD-10-CM

## 2013-07-17 MED ORDER — METOPROLOL TARTRATE 25 MG PO TABS: 25.0000 mg | ORAL_TABLET | Freq: Two times a day (BID) | ORAL | Status: DC

## 2013-07-17 NOTE — Assessment & Plan Note (Addendum)
?   Pac's - recent card even monitor neg per pt; ok to d/c ziac, start metoprolol 25 bid, ? May help tremor as well, cont all other meds, ECG reviewed as per emr
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
## 2013-07-17 NOTE — Patient Instructions (Signed)
OK to stop the ziac Start the metoprolol 25 mg twice per day We could consider increase to 50 mg twice per day if the symptoms still persists Please continue all other medications as before Please have the pharmacy call with any other refills you may need.  Please keep your appointments with your specialists as you have planned  - Dr Tat, and cardiology  Please remember to sign up for My Chart if you have not done so, as this will be important to you in the future with finding out test results, communicating by private email, and scheduling acute appointments online when needed.

## 2013-07-18 ENCOUNTER — Telehealth: Payer: Self-pay | Admitting: Internal Medicine

## 2013-07-18 NOTE — Telephone Encounter (Signed)
Message copied by Corwin Levins on Thu Jul 18, 2013  8:39 AM ------      Message from: Scharlene Gloss B      Created: Thu Jul 18, 2013  8:23 AM       The patient wanted to know the name of a leg test that was mentioned at her OV ------

## 2013-07-18 NOTE — Telephone Encounter (Signed)
Sorry, I am just not sure now what this refers to, since the main concern at the visit I thought was related to the palpitations

## 2013-07-18 NOTE — Telephone Encounter (Signed)
Pt informed

## 2013-07-21 NOTE — Assessment & Plan Note (Signed)
stable overall by history and exam, recent data reviewed with pt, and pt to continue medical treatment as before,  to f/u any worsening symptoms or concerns BP Readings from Last 3 Encounters:  07/17/13 118/70  06/20/13 118/74  04/29/13 120/80

## 2013-07-30 ENCOUNTER — Ambulatory Visit (INDEPENDENT_AMBULATORY_CARE_PROVIDER_SITE_OTHER): Payer: Medicare Other

## 2013-07-30 DIAGNOSIS — Z23 Encounter for immunization: Secondary | ICD-10-CM

## 2013-08-01 LAB — HM MAMMOGRAPHY

## 2013-08-06 ENCOUNTER — Ambulatory Visit (HOSPITAL_COMMUNITY)
Admission: RE | Admit: 2013-08-06 | Discharge: 2013-08-06 | Disposition: A | Discharge status: Home / Self Care | Payer: Medicare Other | Source: Ambulatory Visit | Attending: Internal Medicine | Admitting: Internal Medicine

## 2013-08-06 DIAGNOSIS — Z1231 Encounter for screening mammogram for malignant neoplasm of breast: Secondary | ICD-10-CM | POA: Insufficient documentation

## 2013-09-26 ENCOUNTER — Encounter: Payer: Self-pay | Admitting: Interventional Cardiology

## 2013-09-26 ENCOUNTER — Ambulatory Visit (INDEPENDENT_AMBULATORY_CARE_PROVIDER_SITE_OTHER): Payer: Medicare Other | Admitting: Interventional Cardiology

## 2013-09-26 VITALS — BP 132/70 | HR 68 | Ht 65.0 in | Wt 160.0 lb

## 2013-09-26 DIAGNOSIS — E785 Hyperlipidemia, unspecified: Secondary | ICD-10-CM

## 2013-09-26 DIAGNOSIS — I4891 Unspecified atrial fibrillation: Secondary | ICD-10-CM

## 2013-09-26 DIAGNOSIS — I10 Essential (primary) hypertension: Secondary | ICD-10-CM

## 2013-09-26 DIAGNOSIS — I495 Sick sinus syndrome: Secondary | ICD-10-CM

## 2013-09-26 NOTE — Patient Instructions (Signed)
Your physician recommends that you continue on your current medications as directed. Please refer to the Current Medication list given to you today.   Your physician recommends that you schedule a follow-up appointment as needed  
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
## 2013-10-04 ENCOUNTER — Ambulatory Visit (INDEPENDENT_AMBULATORY_CARE_PROVIDER_SITE_OTHER): Payer: Medicare Other | Admitting: Internal Medicine

## 2013-10-04 ENCOUNTER — Encounter: Payer: Self-pay | Admitting: Internal Medicine

## 2013-10-04 ENCOUNTER — Ambulatory Visit: Payer: Medicare Other | Admitting: Internal Medicine

## 2013-10-04 VITALS — BP 130/84 | HR 79 | Temp 98.7°F | Ht 65.0 in | Wt 159.0 lb

## 2013-10-04 DIAGNOSIS — I10 Essential (primary) hypertension: Secondary | ICD-10-CM

## 2013-10-04 DIAGNOSIS — R7302 Impaired glucose tolerance (oral): Secondary | ICD-10-CM

## 2013-10-04 DIAGNOSIS — Z23 Encounter for immunization: Secondary | ICD-10-CM

## 2013-10-04 DIAGNOSIS — R7309 Other abnormal glucose: Secondary | ICD-10-CM

## 2013-10-04 DIAGNOSIS — E785 Hyperlipidemia, unspecified: Secondary | ICD-10-CM

## 2013-10-04 HISTORY — DX: Impaired glucose tolerance (oral): R73.02

## 2013-10-04 NOTE — Patient Instructions (Signed)
You had the Prevnar pneumonia shot today Please continue all other medications as before, and refills have been done if requested. Please have the pharmacy call with any other refills you may need.  Please remember to sign up for My Chart if you have not done so, as this will be important to you in the future with finding out test results, communicating by private email, and scheduling acute appointments online when needed.  Please return in 6 months, or sooner if needed

## 2013-10-04 NOTE — Progress Notes (Signed)
Pre-visit discussion using our clinic review tool. No additional management support is needed unless otherwise documented below in the visit note.  
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
## 2013-10-06 NOTE — Assessment & Plan Note (Addendum)
stable overall by history and exam, recent data reviewed with pt, and pt to continue medical treatment as before,  to f/u any worsening symptoms or concerns Lab Results  Component Value Date   WBC 12.4* 04/04/2013   HGB 12.9 04/04/2013   HCT 38.1 04/04/2013   PLT 361.0 04/04/2013   GLUCOSE 118* 04/04/2013   CHOL 144 04/04/2013   TRIG 130.0 04/04/2013   HDL 39.50 04/04/2013   LDLCALC 79 04/04/2013   ALT 18 04/04/2013   AST 23 04/04/2013   NA 135 04/04/2013   K 3.7 04/04/2013   CL 97 04/04/2013   CREATININE 1.1 04/04/2013   BUN 20 04/04/2013   CO2 27 04/04/2013   TSH 4.24 04/04/2013

## 2013-10-06 NOTE — Assessment & Plan Note (Signed)
stable overall by history and exam, recent data reviewed with pt, and pt to continue medical treatment as before,  to f/u any worsening symptoms or concerns Lab Results  Component Value Date   LDLCALC 79 04/04/2013

## 2013-10-06 NOTE — Assessment & Plan Note (Signed)
stable overall by history and exam, recent data reviewed with pt, and pt to continue medical treatment as before,  to f/u any worsening symptoms or concerns BP Readings from Last 3 Encounters:  10/04/13 130/84  09/26/13 132/70  07/17/13 118/70

## 2013-10-31 ENCOUNTER — Encounter: Payer: Self-pay | Admitting: Neurology

## 2013-10-31 ENCOUNTER — Ambulatory Visit (INDEPENDENT_AMBULATORY_CARE_PROVIDER_SITE_OTHER): Payer: Medicare Other | Admitting: Neurology

## 2013-10-31 ENCOUNTER — Ambulatory Visit: Payer: Medicare Other | Admitting: Neurology

## 2013-10-31 VITALS — BP 128/80 | HR 84 | Resp 16 | Ht 65.0 in | Wt 161.1 lb

## 2013-10-31 DIAGNOSIS — G243 Spasmodic torticollis: Secondary | ICD-10-CM
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
## 2013-11-06 ENCOUNTER — Ambulatory Visit (INDEPENDENT_AMBULATORY_CARE_PROVIDER_SITE_OTHER)
Admission: RE | Admit: 2013-11-06 | Discharge: 2013-11-06 | Disposition: A | Discharge status: Home / Self Care | Payer: Medicare Other | Source: Ambulatory Visit | Attending: Internal Medicine | Admitting: Internal Medicine

## 2013-11-06 DIAGNOSIS — M899 Disorder of bone, unspecified: Secondary | ICD-10-CM

## 2013-11-06 DIAGNOSIS — M8580 Other specified disorders of bone density and structure, unspecified site: Secondary | ICD-10-CM

## 2013-11-06 DIAGNOSIS — M949 Disorder of cartilage, unspecified: Secondary | ICD-10-CM

## 2013-11-13 ENCOUNTER — Encounter: Payer: Self-pay | Admitting: Internal Medicine

## 2013-12-26 ENCOUNTER — Encounter: Payer: Self-pay | Admitting: Internal Medicine

## 2013-12-26 ENCOUNTER — Other Ambulatory Visit (INDEPENDENT_AMBULATORY_CARE_PROVIDER_SITE_OTHER): Payer: Medicare Other

## 2013-12-26 ENCOUNTER — Ambulatory Visit (INDEPENDENT_AMBULATORY_CARE_PROVIDER_SITE_OTHER): Payer: Medicare Other | Admitting: Internal Medicine

## 2013-12-26 VITALS — BP 118/82 | HR 69 | Temp 98.2°F | Ht 65.0 in | Wt 157.0 lb

## 2013-12-26 DIAGNOSIS — M79609 Pain in unspecified limb: Secondary | ICD-10-CM

## 2013-12-26 DIAGNOSIS — M79642 Pain in left hand: Principal | ICD-10-CM

## 2013-12-26 DIAGNOSIS — I10 Essential (primary) hypertension: Secondary | ICD-10-CM

## 2013-12-26 DIAGNOSIS — M79641 Pain in right hand: Secondary | ICD-10-CM

## 2013-12-26 DIAGNOSIS — R7309 Other abnormal glucose: Secondary | ICD-10-CM

## 2013-12-26 DIAGNOSIS — R7302 Impaired glucose tolerance (oral): Secondary | ICD-10-CM

## 2013-12-26 LAB — RHEUMATOID FACTOR: Rhuematoid fact SerPl-aCnc: <10 IU/mL (ref <=14)

## 2013-12-26 LAB — SEDIMENTATION RATE: SED RATE: 30 mm/h — AB (ref 0–22)

## 2013-12-26 LAB — URIC ACID: Uric Acid, Serum: 5.7 mg/dL (ref 2.4–7.0)

## 2013-12-26 MED ORDER — METHYLPREDNISOLONE ACETATE 80 MG/ML IJ SUSP
80.0000 mg | Freq: Once | INTRAMUSCULAR | Status: AC
  Administered 2013-12-26: 80 mg via INTRAMUSCULAR

## 2013-12-26 MED ORDER — METHYLPREDNISOLONE 4 MG PO KIT: PACK | ORAL | Status: DC

## 2013-12-26 MED ORDER — DICLOFENAC SODIUM 1 % TD GEL: 2.0000 g | Freq: Four times a day (QID) | TRANSDERMAL | Status: DC

## 2013-12-26 NOTE — Assessment & Plan Note (Addendum)
C/w acute synovitis right first and second MCP's; 1st/2nd/3rd mcp on left and left wrist - c/w prob new onset gout, though cant r/o RA completely, for uric acid level, RF, esr, tx with depomedrol 80 IM now, medrol pack for home (allergic to prednisone  - swells her up per pt), f/u any worsening s/s, consider preventive tx if recurrent such as allopurinol; cont the hct for now, avoid etoh
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
## 2013-12-26 NOTE — Addendum Note (Signed)
Addended by: Sharon Seller B on: 12/26/2013 04:12 PM   Modules accepted: Orders

## 2013-12-26 NOTE — Patient Instructions (Addendum)
You had the steroid shot today Please take all new medication as prescribed - the medrol steroid medication, and the topical voltaren gel Please continue all other medications as before, and refills have been done if requested. Please have the pharmacy call with any other refills you may need.  Please go to the LAB in the Basement (turn left off the elevator) for the tests to be done today You will be contacted by phone if any changes need to be made immediately.  Otherwise, you will receive a letter about your results with an explanation, but please check with MyChart first.  Please remember to sign up for MyChart if you have not done so, as this will be important to you in the future with finding out test results, communicating by private email, and scheduling acute appointments online when needed.  Please return in 3 months, or sooner if needed

## 2013-12-26 NOTE — Assessment & Plan Note (Signed)
stable overall by history and exam, recent data reviewed with pt, and pt to continue medical treatment as before,  to f/u any worsening symptoms or concerns Lab Results  Component Value Date   WBC 12.4* 04/04/2013   HGB 12.9 04/04/2013   HCT 38.1 04/04/2013   PLT 361.0 04/04/2013   GLUCOSE 118* 04/04/2013   CHOL 144 04/04/2013   TRIG 130.0 04/04/2013   HDL 39.50 04/04/2013   LDLCALC 79 04/04/2013   ALT 18 04/04/2013   AST 23 04/04/2013   NA 135 04/04/2013   K 3.7 04/04/2013   CL 97 04/04/2013   CREATININE 1.1 04/04/2013   BUN 20 04/04/2013   CO2 27 04/04/2013   TSH 4.24 04/04/2013   To f/u with any onset polys

## 2013-12-26 NOTE — Progress Notes (Signed)
Pre visit review using our clinic review tool, if applicable. No additional management support is needed unless otherwise documented below in the visit note. 

## 2013-12-26 NOTE — Assessment & Plan Note (Signed)
stable overall by history and exam, recent data reviewed with pt, and pt to continue medical treatment as before,  to f/u any worsening symptoms or concerns BP Readings from Last 3 Encounters:  12/26/13 118/82  10/31/13 128/80  10/04/13 130/84

## 2013-12-27 ENCOUNTER — Encounter: Payer: Self-pay | Admitting: Internal Medicine

## 2014-01-01 ENCOUNTER — Telehealth: Payer: Self-pay

## 2014-01-01 DIAGNOSIS — M79641 Pain in right hand: Secondary | ICD-10-CM

## 2014-01-01 DIAGNOSIS — M79642 Pain in left hand: Principal | ICD-10-CM

## 2014-01-01 NOTE — Telephone Encounter (Signed)
The patient called hoping to get information regarding her blood test   Callback - [phone]

## 2014-01-01 NOTE — Telephone Encounter (Signed)
Informed of results.  The patient is still having pain hands, shoulders and back.  The patient did not get the cream was $45.  Patient is taking tylenol for pain.

## 2014-01-01 NOTE — Telephone Encounter (Signed)
Letter has been sent, all tests normal, ok to hold on the cream, tylenol ok to use instead  We can consider referral to rheumatology if she wants

## 2014-01-02 NOTE — Addendum Note (Signed)
Addended by: Biagio Borg on: 01/02/2014 08:38 AM   Modules accepted: Orders

## 2014-01-02 NOTE — Telephone Encounter (Signed)
done

## 2014-01-02 NOTE — Telephone Encounter (Signed)
Informed of MD instructions.  Please do referral to rheumatologist.

## 2014-01-16 ENCOUNTER — Telehealth: Payer: Self-pay | Admitting: Internal Medicine

## 2014-01-16 NOTE — Telephone Encounter (Signed)
Patient wants to know if she needs to be pre-medicated at all before she has her teeth cleaned at the dentist. She is concerned about this since she takes heart medication that Dr. Jenny Reichmann prescribed.

## 2014-01-16 NOTE — Telephone Encounter (Signed)
Due to the new guidelines from just a few yrs ago, routine predental antibx are needed any longer, except for those with congenital heart defects, or if perhaps another MD has recommended this for another reason (such as to avoid knee infections , for example)

## 2014-01-16 NOTE — Telephone Encounter (Signed)
Patient informed of MD's response. 

## 2014-02-01 ENCOUNTER — Other Ambulatory Visit: Payer: Self-pay | Admitting: Internal Medicine

## 2014-03-05 ENCOUNTER — Other Ambulatory Visit: Payer: Self-pay | Admitting: Internal Medicine

## 2014-03-21 ENCOUNTER — Other Ambulatory Visit: Payer: Self-pay | Admitting: Internal Medicine

## 2014-03-28 ENCOUNTER — Encounter: Payer: Self-pay | Admitting: Internal Medicine

## 2014-03-28 ENCOUNTER — Other Ambulatory Visit: Payer: Self-pay | Admitting: Internal Medicine

## 2014-03-28 ENCOUNTER — Ambulatory Visit (INDEPENDENT_AMBULATORY_CARE_PROVIDER_SITE_OTHER): Payer: Medicare Other | Admitting: Internal Medicine

## 2014-03-28 ENCOUNTER — Other Ambulatory Visit (INDEPENDENT_AMBULATORY_CARE_PROVIDER_SITE_OTHER): Payer: Medicare Other

## 2014-03-28 VITALS — BP 128/82 | HR 81 | Temp 98.4°F | Ht 65.0 in | Wt 157.0 lb

## 2014-03-28 DIAGNOSIS — R079 Chest pain, unspecified: Secondary | ICD-10-CM

## 2014-03-28 DIAGNOSIS — Z0000 Encounter for general adult medical examination without abnormal findings: Secondary | ICD-10-CM

## 2014-03-28 DIAGNOSIS — Z23 Encounter for immunization: Secondary | ICD-10-CM

## 2014-03-28 DIAGNOSIS — G2581 Restless legs syndrome: Secondary | ICD-10-CM

## 2014-03-28 DIAGNOSIS — E785 Hyperlipidemia, unspecified: Secondary | ICD-10-CM

## 2014-03-28 LAB — BASIC METABOLIC PANEL
BUN: 16 mg/dL (ref 6–23)
CHLORIDE: 99 meq/L (ref 96–112)
CO2: 29 mEq/L (ref 19–32)
CREATININE: 1 mg/dL (ref 0.4–1.2)
Calcium: 9.7 mg/dL (ref 8.4–10.5)
GFR: 60.54 mL/min (ref >60.00)
GLUCOSE: 106 mg/dL — AB (ref 70–99)
Potassium: 3.7 mEq/L (ref 3.5–5.1)
Sodium: 135 mEq/L (ref 135–145)

## 2014-03-28 LAB — LIPID PANEL
CHOL/HDL RATIO: 3
CHOLESTEROL: 118 mg/dL (ref 0–200)
HDL: 36.2 mg/dL — AB (ref >39.00)
LDL CALC: 58 mg/dL (ref 0–99)
NonHDL: 81.8
TRIGLYCERIDES: 118 mg/dL (ref 0.0–149.0)
VLDL: 23.6 mg/dL (ref 0.0–40.0)

## 2014-03-28 LAB — URINALYSIS, ROUTINE W REFLEX MICROSCOPIC
BILIRUBIN URINE: NEGATIVE
Hgb urine dipstick: NEGATIVE
KETONES UR: NEGATIVE
Nitrite: NEGATIVE
PH: 7 (ref 5.0–8.0)
SPECIFIC GRAVITY, URINE: 1.01 (ref 1.000–1.030)
Total Protein, Urine: NEGATIVE
URINE GLUCOSE: NEGATIVE
Urobilinogen, UA: 0.2 (ref 0.0–1.0)

## 2014-03-28 LAB — TSH: TSH: 2.99 u[IU]/mL (ref 0.35–4.50)

## 2014-03-28 MED ORDER — CLONAZEPAM 1 MG PO TABS: 1.0000 mg | ORAL_TABLET | Freq: Every day | ORAL | Status: DC

## 2014-03-28 MED ORDER — CEPHALEXIN 500 MG PO CAPS: 500.0000 mg | ORAL_CAPSULE | Freq: Four times a day (QID) | ORAL | Status: DC

## 2014-03-28 NOTE — Patient Instructions (Addendum)
You had the Tdap today (tetanus shot)  Your EKG was [facility] today  Please take all new medication as prescribed - the klonopin as needed for the nighttime leg symptoms  Please continue all other medications as before, and refills have been done if requested.  Please have the pharmacy call with any other refills you may need.  Please continue your efforts at being more active, low cholesterol diet, and weight control.  You are otherwise up to date with prevention measures today.  Please keep your appointments with your specialists as you may have planned  Please go to the XRAY Department in the Basement (go straight as you get off the elevator) for the x-ray testing  Please go to the LAB in the Basement (turn left off the elevator) for the tests to be done today  You will be contacted by phone if any changes need to be made immediately.  Otherwise, you will receive a letter about your results with an explanation, but please check with MyChart first.  Please remember to sign up for MyChart if you have not done so, as this will be important to you in the future with finding out test results, communicating by private email, and scheduling acute appointments online when needed.  Please return in 6 months, or sooner if needed
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
## 2014-03-28 NOTE — Assessment & Plan Note (Signed)
C/w RLS or PLMD or even dramatic extended LE fasciculations; for trial klonopin qhs prn

## 2014-03-28 NOTE — Assessment & Plan Note (Signed)

## 2014-03-28 NOTE — Assessment & Plan Note (Signed)
Last cardiolotie stress neg for ischemia oct 2013, current pain is pleuritic, ECG reviewed as per emr, for cxr, but ok to follow if no acute findings

## 2014-03-28 NOTE — Addendum Note (Signed)
Addended by: Sharon Seller B on: 03/28/2014 11:27 AM   Modules accepted: Orders

## 2014-03-28 NOTE — Addendum Note (Signed)
Addended by: Biagio Borg on: 03/28/2014 11:28 AM   Modules accepted: Orders

## 2014-03-28 NOTE — Progress Notes (Signed)
Pre visit review using our clinic review tool, if applicable. No additional management support is needed unless otherwise documented below in the visit note. 

## 2014-04-04 ENCOUNTER — Telehealth: Payer: Self-pay

## 2014-04-04 ENCOUNTER — Ambulatory Visit: Payer: Medicare Other | Admitting: Internal Medicine

## 2014-04-04 NOTE — Telephone Encounter (Signed)
Noted, thanks!

## 2014-04-04 NOTE — Telephone Encounter (Signed)
Phone call from patient wanting to let you know the medication she was given for her tremors is helping. She states the first couple nights she was sleepy but okay now.

## 2014-04-24 ENCOUNTER — Telehealth: Payer: Self-pay | Admitting: Internal Medicine

## 2014-04-24 NOTE — Telephone Encounter (Signed)
Patient states that her [name] [facility]) medication is helping her with her tremors but causes her to be drowsy. Wants to know if Dr. Jenny Reichmann might recommend something else that will not cause her to be drowsy. Please advise.

## 2014-04-24 NOTE — Telephone Encounter (Signed)
The prescription was to take the med at bedtime.    Is the medication causing drowsiness the next day?

## 2014-04-25 MED ORDER — TRAZODONE HCL 50 MG PO TABS: ORAL_TABLET | ORAL | Status: DC

## 2014-04-25 NOTE — Telephone Encounter (Signed)
Patient informed of change. 

## 2014-04-25 NOTE — Telephone Encounter (Signed)
Ok to try change to trazodone prn , d/c klonopin

## 2014-04-25 NOTE — Telephone Encounter (Signed)
Informed the patient of MD's response.  She is taking at night and sleepy until around noon the next day.

## 2014-04-29 ENCOUNTER — Telehealth: Payer: Self-pay | Admitting: Internal Medicine

## 2014-04-29 NOTE — Telephone Encounter (Signed)
Ok to try half, but if still symptoms or does not work, may need to be changed

## 2014-04-29 NOTE — Telephone Encounter (Signed)
Patient states that she has been taking the trazodone prescription and now her lips and tongue are numb and she is feeling nauseous. Wants to know what she should do now. Please advise.

## 2014-04-30 NOTE — Telephone Encounter (Signed)
Patient informed of MD instructions. 

## 2014-05-04 ENCOUNTER — Encounter (HOSPITAL_COMMUNITY): Payer: Self-pay | Admitting: Emergency Medicine

## 2014-05-04 ENCOUNTER — Emergency Department (HOSPITAL_COMMUNITY)
Admission: EM | Admit: 2014-05-04 | Discharge: 2014-05-04 | Disposition: A | Discharge status: Home / Self Care | Payer: Medicare Other | Attending: Emergency Medicine | Admitting: Emergency Medicine

## 2014-05-04 DIAGNOSIS — R209 Unspecified disturbances of skin sensation: Secondary | ICD-10-CM | POA: Insufficient documentation

## 2014-05-04 DIAGNOSIS — I4891 Unspecified atrial fibrillation: Secondary | ICD-10-CM | POA: Insufficient documentation

## 2014-05-04 DIAGNOSIS — I10 Essential (primary) hypertension: Secondary | ICD-10-CM | POA: Insufficient documentation

## 2014-05-04 DIAGNOSIS — Z87828 Personal history of other (healed) physical injury and trauma: Secondary | ICD-10-CM | POA: Insufficient documentation

## 2014-05-04 DIAGNOSIS — D6859 Other primary thrombophilia: Secondary | ICD-10-CM | POA: Insufficient documentation

## 2014-05-04 DIAGNOSIS — Z86718 Personal history of other venous thrombosis and embolism: Secondary | ICD-10-CM | POA: Insufficient documentation

## 2014-05-04 DIAGNOSIS — Z79899 Other long term (current) drug therapy: Secondary | ICD-10-CM | POA: Insufficient documentation

## 2014-05-04 DIAGNOSIS — M069 Rheumatoid arthritis, unspecified: Secondary | ICD-10-CM | POA: Insufficient documentation

## 2014-05-04 DIAGNOSIS — Z792 Long term (current) use of antibiotics: Secondary | ICD-10-CM | POA: Insufficient documentation

## 2014-05-04 DIAGNOSIS — E871 Hypo-osmolality and hyponatremia: Secondary | ICD-10-CM | POA: Insufficient documentation

## 2014-05-04 DIAGNOSIS — R259 Unspecified abnormal involuntary movements: Secondary | ICD-10-CM | POA: Insufficient documentation

## 2014-05-04 DIAGNOSIS — Z8709 Personal history of other diseases of the respiratory system: Secondary | ICD-10-CM | POA: Insufficient documentation

## 2014-05-04 DIAGNOSIS — E785 Hyperlipidemia, unspecified: Secondary | ICD-10-CM | POA: Insufficient documentation

## 2014-05-04 DIAGNOSIS — Z8719 Personal history of other diseases of the digestive system: Secondary | ICD-10-CM | POA: Insufficient documentation

## 2014-05-04 DIAGNOSIS — IMO0002 Reserved for concepts with insufficient information to code with codable children: Secondary | ICD-10-CM | POA: Insufficient documentation

## 2014-05-04 DIAGNOSIS — R339 Retention of urine, unspecified: Secondary | ICD-10-CM | POA: Insufficient documentation

## 2014-05-04 DIAGNOSIS — G250 Essential tremor: Secondary | ICD-10-CM | POA: Insufficient documentation

## 2014-05-04 DIAGNOSIS — Z8742 Personal history of other diseases of the female genital tract: Secondary | ICD-10-CM | POA: Insufficient documentation

## 2014-05-04 DIAGNOSIS — Z791 Long term (current) use of non-steroidal anti-inflammatories (NSAID): Secondary | ICD-10-CM | POA: Insufficient documentation

## 2014-05-04 DIAGNOSIS — Z7982 Long term (current) use of aspirin: Secondary | ICD-10-CM | POA: Insufficient documentation

## 2014-05-04 DIAGNOSIS — Z8672 Personal history of thrombophlebitis: Secondary | ICD-10-CM | POA: Insufficient documentation

## 2014-05-04 DIAGNOSIS — R110 Nausea: Secondary | ICD-10-CM | POA: Insufficient documentation

## 2014-05-04 DIAGNOSIS — G252 Other specified forms of tremor: Secondary | ICD-10-CM

## 2014-05-04 DIAGNOSIS — Z87440 Personal history of urinary (tract) infections: Secondary | ICD-10-CM | POA: Insufficient documentation

## 2014-05-04 HISTORY — DX: Rheumatoid arthritis, unspecified: M06.9

## 2014-05-04 LAB — BASIC METABOLIC PANEL
ANION GAP: 13 (ref 5–15)
BUN: 10 mg/dL (ref 6–23)
CO2: 24 mEq/L (ref 19–32)
CREATININE: 0.79 mg/dL (ref 0.50–1.10)
Calcium: 9.5 mg/dL (ref 8.4–10.5)
Chloride: 89 mEq/L — ABNORMAL LOW (ref 96–112)
GFR, EST NON AFRICAN AMERICAN: 84 mL/min — AB (ref >90)
Glucose, Bld: 135 mg/dL — ABNORMAL HIGH (ref 70–99)
Potassium: 3.7 mEq/L (ref 3.7–5.3)
Sodium: 126 mEq/L — ABNORMAL LOW (ref 137–147)

## 2014-05-04 LAB — CBC WITH DIFFERENTIAL/PLATELET
Basophils Absolute: 0 10*3/uL (ref 0.0–0.1)
Basophils Relative: 0 % (ref 0–1)
Eosinophils Absolute: 0.1 10*3/uL (ref 0.0–0.7)
Eosinophils Relative: 1 % (ref 0–5)
HEMATOCRIT: 34.2 % — AB (ref 36.0–46.0)
Hemoglobin: 11.9 g/dL — ABNORMAL LOW (ref 12.0–15.0)
Lymphocytes Relative: 12 % (ref 12–46)
Lymphs Abs: 1.5 10*3/uL (ref 0.7–4.0)
MCH: 31.9 pg (ref 26.0–34.0)
MCHC: 34.8 g/dL (ref 30.0–36.0)
MCV: 91.7 fL (ref 78.0–100.0)
MONO ABS: 0.6 10*3/uL (ref 0.1–1.0)
Monocytes Relative: 5 % (ref 3–12)
Neutro Abs: 10.3 10*3/uL — ABNORMAL HIGH (ref 1.7–7.7)
Neutrophils Relative %: 82 % — ABNORMAL HIGH (ref 43–77)
Platelets: 338 10*3/uL (ref 150–400)
RBC: 3.73 MIL/uL — ABNORMAL LOW (ref 3.87–5.11)
RDW: 13.6 % (ref 11.5–15.5)
WBC: 12.6 10*3/uL — ABNORMAL HIGH (ref 4.0–10.5)

## 2014-05-04 LAB — URINALYSIS, ROUTINE W REFLEX MICROSCOPIC
Bilirubin Urine: NEGATIVE
Glucose, UA: NEGATIVE mg/dL
Hgb urine dipstick: NEGATIVE
Ketones, ur: NEGATIVE mg/dL
Leukocytes, UA: NEGATIVE
NITRITE: NEGATIVE
Protein, ur: NEGATIVE mg/dL
Specific Gravity, Urine: 1.009 (ref 1.005–1.030)
Urobilinogen, UA: 0.2 mg/dL (ref 0.0–1.0)
pH: 7.5 (ref 5.0–8.0)

## 2014-05-04 NOTE — Discharge Instructions (Signed)
Call your doctor tomorrow and time that your sodium was 126 and that he needs to adjust your medications. Return here for any trouble urinating Hyponatremia  Hyponatremia is when the amount of salt (sodium) in your blood is too low. When sodium levels are low, your cells will absorb extra water and swell. The swelling happens throughout the body, but it mostly affects the brain. Severe brain swelling (cerebral edema), seizures, or coma can happen.  CAUSES   Heart, kidney, or liver problems.  Thyroid problems.  Adrenal gland problems.  Severe vomiting and diarrhea.  Certain medicines or illegal drugs.  Dehydration.  Drinking too much water.  Low-sodium diet. SYMPTOMS   Nausea and vomiting.  Confusion.  Lethargy.  Agitation.  Headache.  Twitching or shaking (seizures).  Unconsciousness.  Appetite loss.  Muscle weakness and cramping. DIAGNOSIS  Hyponatremia is identified by a simple blood test. Your caregiver will perform a history and physical exam to try to find the cause and type of hyponatremia. Other tests may be needed to measure the amount of sodium in your blood and urine. TREATMENT  Treatment will depend on the cause.   Fluids may be given through the vein (IV).  Medicines may be used to correct the sodium imbalance. If medicines are causing the problem, they will need to be adjusted.  Water or fluid intake may be restricted to restore proper balance. The speed of correcting the sodium problem is very important. If the problem is corrected too fast, nerve damage (sometimes unchangeable) can happen. HOME CARE INSTRUCTIONS   Only take medicines as directed by your caregiver. Many medicines can make hyponatremia worse. Discuss all your medicines with your caregiver.  Carefully follow any recommended diet, including any fluid restrictions.  You may be asked to repeat lab tests. Follow these directions.  Avoid alcohol and recreational drugs. SEEK MEDICAL  CARE IF:   You develop worsening nausea, fatigue, headache, confusion, or weakness.  Your original hyponatremia symptoms return.  You have problems following the recommended diet. SEEK IMMEDIATE MEDICAL CARE IF:   You have a seizure.  You faint.  You have ongoing diarrhea or vomiting. MAKE SURE YOU:   Understand these instructions.  Will watch your condition.  Will get help right away if you are not doing well or get worse. Document Released: 09/23/2002 Document Revised: 12/26/2011 Document Reviewed: 03/20/2011 [facility] Patient Information 2015 [place], [place]. This information is not intended to replace advice given to you by your health care provider. Make sure you discuss any questions you have with your health care provider.  Acute Urinary Retention Acute urinary retention is the temporary inability to urinate. This is an uncommon problem in women. It can be caused by:  Infection.  A side effect of a medicine.  A problem in a nearby organ that presses or squeezes on the bladder or the urethra (the tube that drains the bladder).  Psychological problems.   Surgery on your bladder, urethra, or pelvic organs that causes obstruction to the outflow of urine from your bladder. HOME CARE INSTRUCTIONS  If you are sent home with a Foley catheter and a drainage system, you will need to discuss the best course of action with your health care provider. While the catheter is in, maintain a good intake of fluids. Keep the drainage bag emptied and lower than your catheter. This is so that contaminated urine will not flow back into your bladder, which could lead to a urinary tract infection. There are two main types of  drainage bags. One is a large bag that usually is used at night. It has a good capacity that will allow you to sleep through the night without having to empty it. The second type is called a leg bag. It has a smaller capacity so it needs to be emptied more frequently. However,  the main advantage is that it can be attached by a leg strap and goes underneath your clothing, allowing you the freedom to move about or leave your home. Only take over-the-counter or prescription medicines for pain, discomfort, or fever as directed by your health care provider.  SEEK MEDICAL CARE IF:  You develop a low-grade fever.  You experience spasms or leakage of urine with the spasms. SEEK IMMEDIATE MEDICAL CARE IF:   You develop chills or fever.  Your catheter stops draining urine.  Your catheter falls out.  You start to develop increased bleeding that does not respond to rest and increased fluid intake. MAKE SURE YOU:  Understand these instructions.  Will watch your condition.  Will get help right away if you are not doing well or get worse. Document Released: 10/02/2006 Document Revised: 07/24/2013 Document Reviewed: 03/14/2013 [facility] Patient Information 2015 [place], [place]. This information is not intended to replace advice given to you by your health care provider. Make sure you discuss any questions you have with your health care provider.

## 2014-05-04 NOTE — ED Notes (Signed)
Pt c/o urinary retention, nausea, tingling in lips, and "feeling weird" x 10 days.  Pt reports all symptoms started after being prescribed trazodone by her PCP, John MD.  Pt sts "I'm just not urinating as much as I normally do."
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
## 2014-05-05 ENCOUNTER — Other Ambulatory Visit: Payer: Self-pay | Admitting: Internal Medicine

## 2014-05-05 MED ORDER — TOPIRAMATE 50 MG PO TABS: 50.0000 mg | ORAL_TABLET | Freq: Two times a day (BID) | ORAL | Status: DC

## 2014-05-05 NOTE — Telephone Encounter (Signed)
Done erx 

## 2014-05-06 LAB — URINE CULTURE
Colony Count: NO GROWTH
Culture: NO GROWTH

## 2014-05-07 ENCOUNTER — Other Ambulatory Visit (INDEPENDENT_AMBULATORY_CARE_PROVIDER_SITE_OTHER): Payer: Medicare Other

## 2014-05-07 ENCOUNTER — Ambulatory Visit (INDEPENDENT_AMBULATORY_CARE_PROVIDER_SITE_OTHER): Payer: Medicare Other | Admitting: Internal Medicine

## 2014-05-07 ENCOUNTER — Encounter: Payer: Self-pay | Admitting: Internal Medicine

## 2014-05-07 VITALS — BP 120/80 | HR 78 | Temp 98.0°F | Wt 158.2 lb

## 2014-05-07 DIAGNOSIS — E871 Hypo-osmolality and hyponatremia: Secondary | ICD-10-CM

## 2014-05-07 DIAGNOSIS — Z87448 Personal history of other diseases of urinary system: Secondary | ICD-10-CM

## 2014-05-07 DIAGNOSIS — D72819 Decreased white blood cell count, unspecified: Secondary | ICD-10-CM

## 2014-05-07 DIAGNOSIS — R209 Unspecified disturbances of skin sensation: Secondary | ICD-10-CM

## 2014-05-07 DIAGNOSIS — I10 Essential (primary) hypertension: Secondary | ICD-10-CM

## 2014-05-07 DIAGNOSIS — R202 Paresthesia of skin: Secondary | ICD-10-CM

## 2014-05-07 DIAGNOSIS — Z87898 Personal history of other specified conditions: Secondary | ICD-10-CM

## 2014-05-07 LAB — CBC WITH DIFFERENTIAL/PLATELET
Basophils Absolute: 0 K/uL (ref 0.0–0.1)
Basophils Relative: 0.4 % (ref 0.0–3.0)
Eosinophils Absolute: 0.2 K/uL (ref 0.0–0.7)
Eosinophils Relative: 2.2 % (ref 0.0–5.0)
HCT: 35.2 % — ABNORMAL LOW (ref 36.0–46.0)
Hemoglobin: 11.9 g/dL — ABNORMAL LOW (ref 12.0–15.0)
Lymphocytes Relative: 21.4 % (ref 12.0–46.0)
Lymphs Abs: 2 K/uL (ref 0.7–4.0)
MCHC: 34 g/dL (ref 30.0–36.0)
MCV: 95.3 fl (ref 78.0–100.0)
Monocytes Absolute: 0.7 K/uL (ref 0.1–1.0)
Monocytes Relative: 7.4 % (ref 3.0–12.0)
Neutro Abs: 6.4 K/uL (ref 1.4–7.7)
Neutrophils Relative %: 68.6 % (ref 43.0–77.0)
Platelets: 377 K/uL (ref 150.0–400.0)
RBC: 3.69 Mil/uL — ABNORMAL LOW (ref 3.87–5.11)
RDW: 14.6 % (ref 11.5–15.5)
WBC: 9.3 K/uL (ref 4.0–10.5)

## 2014-05-07 LAB — BASIC METABOLIC PANEL WITH GFR
BUN: 15 mg/dL (ref 6–23)
CO2: 29 meq/L (ref 19–32)
Calcium: 9.8 mg/dL (ref 8.4–10.5)
Chloride: 99 meq/L (ref 96–112)
Creatinine, Ser: 0.9 mg/dL (ref 0.4–1.2)
GFR: 64.33 mL/min (ref >60.00)
Glucose, Bld: 104 mg/dL — ABNORMAL HIGH (ref 70–99)
Potassium: 3.8 meq/L (ref 3.5–5.1)
Sodium: 134 meq/L — ABNORMAL LOW (ref 135–145)

## 2014-05-07 MED ORDER — HYDROCHLOROTHIAZIDE 12.5 MG PO CAPS: 12.5000 mg | ORAL_CAPSULE | Freq: Every day | ORAL | Status: DC

## 2014-05-07 NOTE — Patient Instructions (Signed)
Minimal Blood Pressure Goal= AVERAGE < 140/90;  Ideal is an AVERAGE < 135/85. This AVERAGE should be calculated from @ least 5-7 BP readings taken @ different times of day on different days of week. You should not respond to isolated BP readings , but rather the AVERAGE for that week .Please bring your  blood pressure cuff to office visits to verify that it is reliable.It  can also be checked against the blood pressure device at the pharmacy. Finger or wrist cuffs are not dependable; an arm cuff is.   Your next office appointment will be determined based upon review of your pending labs . Those instructions will be transmitted to you  by mail. 

## 2014-05-07 NOTE — Progress Notes (Signed)
   Subjective:    Patient ID: [name], female    DOB: Dec 08, 1945, 68 y.o.   MRN: [id number]  HPI Pt is here for hospital f/u. Seen in ED on 05/04/14 for anuria and lip paraesthesias with nausea. She was straight cathed and U/A, UCx was performed that showed no bacteria. The anuria was thought to be from trazodone that the pt had recently started. She was found to be hyponatremic, Na+=126. Her WBC elevated at 12.6.   Today the pt's urinary symptoms have resolved. She is urinating daily without dysuria or hematuria. The pt continues to have some lip paraesthesia on the lower lip. This symptoms has been going on for a approximately 1 week now. She has not tried any remedies to improve the problem. She does take Vitamin B12 154mcg PO daily.   Pt has experienced night sweats the past two nights. She denies fever or chills.     Review of Systems  HENT: Negative for trouble swallowing.   Respiratory: Negative for cough and shortness of breath.   Cardiovascular: Negative for chest pain.  Gastrointestinal: Negative for abdominal pain.  Genitourinary: Negative for dysuria and hematuria.  Neurological: Negative for dizziness.  Psychiatric/Behavioral: Negative for confusion.       Objective:   Physical Exam Resting generalized body tremor        Assessment & Plan:

## 2014-05-07 NOTE — Progress Notes (Signed)
   Subjective:    Patient ID: [name], female    DOB: [birth year], 68 y.o.   MRN: [id number]  HPI  The emergency room records 05/04/14 were reviewed. She was seen for anuria and with paresthesias and associated nausea. Straight cath was performed; culture was negative for urinary tract infection. Anuria has not recurred since trazodone  was discontinued. She's had complete resolution of the anuria but paresthesias of the lower lip and to lesser extent of the upper lip have persisted.  She was also found to be hyponatremic with sodium 126. She had leukocytosis with a white count of 12,600  The trazodone  prescribed for her head tremor was felt to be the cause & was discontinued.  Significantly she is on an ACE inhibitor/hydrochlorothiazide combination.      Review of Systems  She has chronic tinnitus without hearing loss  She has no significant cough and no shortness of breath.  She has no abdominal pain, nausea, vomiting  There is no dysuria, pyuria, or hematuria.  She also has no fever, chills, or sweats.     Objective:   Physical Exam  Very subtle, fine tremor of the head.  Accentuation of the mid thoracic spine.  Otherwise no abnormal findings including cranial nerve exam.  General appearance is one of good health and nourishment w/o distress.  Eyes: No conjunctival inflammation or scleral icterus is present.  Oral exam: Dental hygiene is good; lips and gums are healthy appearing.There is no oropharyngeal erythema or exudate noted. No lip or tongue swelling. No oropharyngeal compromise.  Heart:  Normal rate and regular rhythm. S1 and S2 normal without gallop, murmur, click, rub or other extra sounds     Lungs:Chest clear to auscultation; no wheezes, rhonchi,rales ,or rubs present.No increased work of breathing.   Abdomen: bowel sounds normal, soft and non-tender without masses, organomegaly or hernias noted.    Skin:Warm & dry.  Intact without suspicious  lesions or rashes ; no jaundice or tenting  Lymphatic: No lymphadenopathy is noted about the head, neck, axilla          Assessment & Plan:   #1 anuria, resolved  #2 hyponatremia  #3 leukocytosis  #4 lip paresthesia in context of ACE-I  See orders and recommendations

## 2014-05-07 NOTE — Progress Notes (Signed)
Pre visit review using our clinic review tool, if applicable. No additional management support is needed unless otherwise documented below in the visit note. 

## 2014-05-14 ENCOUNTER — Ambulatory Visit (INDEPENDENT_AMBULATORY_CARE_PROVIDER_SITE_OTHER): Payer: Medicare Other | Admitting: Internal Medicine

## 2014-05-14 ENCOUNTER — Encounter: Payer: Self-pay | Admitting: Internal Medicine

## 2014-05-14 VITALS — BP 122/78 | HR 75 | Temp 98.9°F | Ht 65.0 in | Wt 159.5 lb

## 2014-05-14 DIAGNOSIS — F418 Other specified anxiety disorders: Secondary | ICD-10-CM

## 2014-05-14 DIAGNOSIS — F341 Dysthymic disorder: Secondary | ICD-10-CM

## 2014-05-14 DIAGNOSIS — R7309 Other abnormal glucose: Secondary | ICD-10-CM

## 2014-05-14 DIAGNOSIS — R7302 Impaired glucose tolerance (oral): Secondary | ICD-10-CM

## 2014-05-14 DIAGNOSIS — E785 Hyperlipidemia, unspecified: Secondary | ICD-10-CM

## 2014-05-14 DIAGNOSIS — I10 Essential (primary) hypertension: Secondary | ICD-10-CM
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
## 2014-05-14 NOTE — Progress Notes (Signed)
Pre visit review using our clinic review tool, if applicable. No additional management support is needed unless otherwise documented below in the visit note. 

## 2014-05-14 NOTE — Patient Instructions (Addendum)
Please continue all other medications as before, and refills have been done if requested.  Please have the pharmacy call with any other refills you may need.  Please keep your appointments with your specialists as you may have planned  Please consider follow up with Dr Tat for worsening tremors  Please return in 6 months, or sooner if needed

## 2014-05-15 ENCOUNTER — Other Ambulatory Visit: Payer: Self-pay | Admitting: *Deleted

## 2014-05-16 DIAGNOSIS — F418 Other specified anxiety disorders: Secondary | ICD-10-CM | POA: Insufficient documentation

## 2014-05-16 NOTE — Assessment & Plan Note (Signed)
With some situational worsening recent, but declines need for change in tx at this time, declines counseling

## 2014-05-16 NOTE — Assessment & Plan Note (Signed)
stable overall by history and exam, recent data reviewed with pt, and pt to continue medical treatment as before,  to f/u any worsening symptoms or concerns Lab Results  Component Value Date   WBC 9.3 05/07/2014   HGB 11.9* 05/07/2014   HCT 35.2* 05/07/2014   PLT 377.0 05/07/2014   GLUCOSE 104* 05/07/2014   CHOL 118 03/28/2014   TRIG 118.0 03/28/2014   HDL 36.20* 03/28/2014   LDLCALC 58 03/28/2014   ALT 18 04/04/2013   AST 23 04/04/2013   NA 134* 05/07/2014   K 3.8 05/07/2014   CL 99 05/07/2014   CREATININE 0.9 05/07/2014   BUN 15 05/07/2014   CO2 29 05/07/2014   TSH 2.99 03/28/2014

## 2014-05-16 NOTE — Assessment & Plan Note (Signed)
stable overall by history and exam, recent data reviewed with pt, and pt to continue medical treatment as before,  to f/u any worsening symptoms or concerns Lab Results  Component Value Date   LDLCALC 58 03/28/2014

## 2014-05-16 NOTE — Assessment & Plan Note (Signed)
stable overall by history and exam, recent data reviewed with pt, and pt to continue medical treatment as before,  to f/u any worsening symptoms or concerns BP Readings from Last 3 Encounters:  05/14/14 122/78  05/07/14 120/80  05/04/14 142/71

## 2014-06-18 ENCOUNTER — Telehealth: Payer: Self-pay | Admitting: Internal Medicine

## 2014-06-18 NOTE — Telephone Encounter (Signed)
Patient Information:  Caller Name: Nelline  Phone: [phone]  Patient: Khali, Perella  Gender: Female  DOB: 1945-11-22  Age: 68 Years  PCP: Cathlean Cower (Adults only)  Office Follow Up:  Does the office need to follow up with this patient?: No  Instructions For The Office: N/A  RN Note:  Pt states that she has night sweats x 2 weeks.  She also has noticed an increase in daytime sweating.  Pt reports that she had a Partial Historectomy in her 33's and she believes that she 'went thru the change' in her 35's.  She does not take HRT, she has in the past and developed a DVT.  Pt called Rheumotologist to ask if her Methotrexate could cause the symptoms and was told no.  She denies any other symptoms including chest pain.  She did have a TB skin test prior to starting Methotrexate 8-9 months ago and it was neg.   Pt triaged per [facility] guideline-Menopause, diagnosised, or suspected protocol.  'Hot flashes, night sweats and not evaluated or not responding to treatment prescribed by provider' generated a yes.  Pt agrees to OV within 2 weeks.  Pt connected with office for appt scheduling.  Symptoms  Reason For Call & Symptoms: Increased sweating.  Onset around 2 weeks ago.  Reviewed Health History In EMR: Yes  Reviewed Medications In EMR: Yes  Reviewed Allergies In EMR: Yes  Reviewed Surgeries / Procedures: Yes  Date of Onset of Symptoms: 06/11/2014  Guideline(s) Used:  No Protocol Available - Sick Adult  Disposition Per Guideline:   See Within 2 Weeks in Office  Reason For Disposition Reached:   Nursing judgment  Advice Given:  N/A  Patient Will Follow Care Advice:  YES

## 2014-06-27 ENCOUNTER — Other Ambulatory Visit: Payer: Self-pay | Admitting: Internal Medicine

## 2014-06-27 ENCOUNTER — Other Ambulatory Visit (INDEPENDENT_AMBULATORY_CARE_PROVIDER_SITE_OTHER): Payer: Medicare Other

## 2014-06-27 ENCOUNTER — Ambulatory Visit (INDEPENDENT_AMBULATORY_CARE_PROVIDER_SITE_OTHER): Payer: Medicare Other | Admitting: Internal Medicine

## 2014-06-27 ENCOUNTER — Encounter: Payer: Self-pay | Admitting: Internal Medicine

## 2014-06-27 VITALS — BP 126/84 | HR 81 | Temp 98.8°F | Ht 65.0 in | Wt 158.5 lb

## 2014-06-27 DIAGNOSIS — R61 Generalized hyperhidrosis: Secondary | ICD-10-CM

## 2014-06-27 DIAGNOSIS — Z23 Encounter for immunization: Secondary | ICD-10-CM

## 2014-06-27 DIAGNOSIS — R7309 Other abnormal glucose: Secondary | ICD-10-CM

## 2014-06-27 DIAGNOSIS — R7302 Impaired glucose tolerance (oral): Secondary | ICD-10-CM

## 2014-06-27 DIAGNOSIS — E785 Hyperlipidemia, unspecified: Secondary | ICD-10-CM

## 2014-06-27 DIAGNOSIS — I10 Essential (primary) hypertension: Secondary | ICD-10-CM

## 2014-06-27 LAB — URINALYSIS, ROUTINE W REFLEX MICROSCOPIC
Bilirubin Urine: NEGATIVE
Hgb urine dipstick: NEGATIVE
KETONES UR: NEGATIVE
Nitrite: NEGATIVE
PH: 6 (ref 5.0–8.0)
SPECIFIC GRAVITY, URINE: 1.015 (ref 1.000–1.030)
TOTAL PROTEIN, URINE-UPE24: NEGATIVE
URINE GLUCOSE: NEGATIVE
UROBILINOGEN UA: 0.2 (ref 0.0–1.0)

## 2014-06-27 MED ORDER — CEPHALEXIN 500 MG PO CAPS: 500.0000 mg | ORAL_CAPSULE | Freq: Four times a day (QID) | ORAL | Status: DC

## 2014-06-27 NOTE — Progress Notes (Signed)
Pre visit review using our clinic review tool, if applicable. No additional management support is needed unless otherwise documented below in the visit note. 

## 2014-06-27 NOTE — Patient Instructions (Signed)
You had the flu shot today  Please continue all other medications as before, and refills have been done if requested.  Please have the pharmacy call with any other refills you may need.  Please continue your efforts at being more active, low cholesterol diet, and weight control.  You are otherwise up to date with prevention measures today.  Please keep your appointments with your specialists as you may have planned  Please go to the LAB in the Basement (turn left off the elevator) for the tests to be done today - the urine testing  You will be contacted by phone if any changes need to be made immediately.  Otherwise, you will receive a letter about your results with an explanation, but please check with MyChart first.  Please remember to sign up for MyChart if you have not done so, as this will be important to you in the future with finding out test results, communicating by private email, and scheduling acute appointments online when needed.
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
## 2014-06-28 DIAGNOSIS — R61 Generalized hyperhidrosis: Secondary | ICD-10-CM | POA: Insufficient documentation

## 2014-06-28 NOTE — Assessment & Plan Note (Signed)
stable overall by history and exam, recent data reviewed with pt, and pt to continue medical treatment as before,  to f/u any worsening symptoms or concerns Lab Results  Component Value Date   LDLCALC 58 03/28/2014   Cont diet, for f/u lipids

## 2014-06-28 NOTE — Assessment & Plan Note (Signed)
stable overall by history and exam, recent data reviewed with pt, and pt to continue medical treatment as before,  to f/u any worsening symptoms or concerns BP Readings from Last 3 Encounters:  06/27/14 126/84  05/14/14 122/78  05/07/14 120/80

## 2014-06-28 NOTE — Assessment & Plan Note (Signed)
stable overall by history and exam, recent data reviewed with pt, and pt to continue medical treatment as before,  to f/u any worsening symptoms or concerns, for a1c, cont wt control, diet

## 2014-06-28 NOTE — Assessment & Plan Note (Signed)
Etiology unclear, for UA r/o uti 

## 2014-06-29 ENCOUNTER — Other Ambulatory Visit: Payer: Self-pay | Admitting: Internal Medicine

## 2014-07-11 ENCOUNTER — Telehealth: Payer: Self-pay | Admitting: Internal Medicine

## 2014-07-11 ENCOUNTER — Other Ambulatory Visit (INDEPENDENT_AMBULATORY_CARE_PROVIDER_SITE_OTHER): Payer: Medicare Other

## 2014-07-11 DIAGNOSIS — N390 Urinary tract infection, site not specified: Secondary | ICD-10-CM

## 2014-07-11 LAB — URINALYSIS, ROUTINE W REFLEX MICROSCOPIC
Bilirubin Urine: NEGATIVE
Ketones, ur: NEGATIVE
NITRITE: NEGATIVE
Specific Gravity, Urine: <=1.005 — AB (ref 1.000–1.030)
Total Protein, Urine: NEGATIVE
UROBILINOGEN UA: 0.2 (ref 0.0–1.0)
Urine Glucose: NEGATIVE
pH: 5.5 (ref 5.0–8.0)

## 2014-07-11 MED ORDER — NITROFURANTOIN MONOHYD MACRO 100 MG PO CAPS: 100.0000 mg | ORAL_CAPSULE | Freq: Two times a day (BID) | ORAL | Status: DC

## 2014-07-11 NOTE — Telephone Encounter (Signed)
Patient called back.  She believed someone tried to call.  She states she is at home now.

## 2014-07-11 NOTE — Telephone Encounter (Signed)
Called pt on home number keep getting busy signal so call cell number [facility] with md response. Place order for UA & culture...[name]

## 2014-07-11 NOTE — Telephone Encounter (Signed)
UA is indeed c/w possible uti  Ok for antibx - done erx - macrobid-   To let pt know

## 2014-07-11 NOTE — Telephone Encounter (Signed)
Some odd symptoms can remain after infection but still represent true infection, but left over irritation due to need for healing  OK for repeat UA and culture (599.0) otherwise i would not rx a second antibx unless she has other high fever, blood in urine, worsening pain with urination

## 2014-07-11 NOTE — Telephone Encounter (Signed)
Patient states Dr. Jenny Reichmann gave her a med for a UTI.  She states she has finished the med but she is still having symptoms.  Symptom include chills, her back still bothers her and she does not feel right.

## 2014-07-14 NOTE — Telephone Encounter (Signed)
Pt informed

## 2014-07-25 ENCOUNTER — Other Ambulatory Visit: Payer: Self-pay

## 2014-07-25 DIAGNOSIS — I10 Essential (primary) hypertension: Secondary | ICD-10-CM

## 2014-07-25 MED ORDER — HYDROCHLOROTHIAZIDE 12.5 MG PO CAPS: 12.5000 mg | ORAL_CAPSULE | Freq: Every day | ORAL | Status: DC

## 2014-09-26 ENCOUNTER — Encounter: Payer: Self-pay | Admitting: Internal Medicine

## 2014-09-26 ENCOUNTER — Ambulatory Visit (INDEPENDENT_AMBULATORY_CARE_PROVIDER_SITE_OTHER): Payer: Medicare Other | Admitting: Internal Medicine

## 2014-09-26 ENCOUNTER — Other Ambulatory Visit (INDEPENDENT_AMBULATORY_CARE_PROVIDER_SITE_OTHER): Payer: Medicare Other

## 2014-09-26 VITALS — BP 122/80 | HR 69 | Temp 98.5°F | Ht 65.0 in | Wt 158.0 lb

## 2014-09-26 DIAGNOSIS — I10 Essential (primary) hypertension: Secondary | ICD-10-CM

## 2014-09-26 DIAGNOSIS — D6489 Other specified anemias: Secondary | ICD-10-CM | POA: Diagnosis not present

## 2014-09-26 DIAGNOSIS — Z23 Encounter for immunization: Secondary | ICD-10-CM

## 2014-09-26 DIAGNOSIS — M25511 Pain in right shoulder: Secondary | ICD-10-CM

## 2014-09-26 DIAGNOSIS — R7302 Impaired glucose tolerance (oral): Secondary | ICD-10-CM

## 2014-09-26 DIAGNOSIS — D649 Anemia, unspecified: Secondary | ICD-10-CM | POA: Insufficient documentation

## 2014-09-26 LAB — CBC WITH DIFFERENTIAL/PLATELET
BASOS ABS: 0.1 10*3/uL (ref 0.0–0.1)
Basophils Relative: 0.9 % (ref 0.0–3.0)
EOS ABS: 0.2 10*3/uL (ref 0.0–0.7)
Eosinophils Relative: 2.2 % (ref 0.0–5.0)
HEMATOCRIT: 37.8 % (ref 36.0–46.0)
HEMOGLOBIN: 12.5 g/dL (ref 12.0–15.0)
LYMPHS ABS: 2.3 10*3/uL (ref 0.7–4.0)
LYMPHS PCT: 29.2 % (ref 12.0–46.0)
MCHC: 33.1 g/dL (ref 30.0–36.0)
MCV: 95.1 fl (ref 78.0–100.0)
Monocytes Absolute: 0.5 10*3/uL (ref 0.1–1.0)
Monocytes Relative: 5.8 % (ref 3.0–12.0)
Neutro Abs: 4.9 10*3/uL (ref 1.4–7.7)
Neutrophils Relative %: 61.9 % (ref 43.0–77.0)
Platelets: 354 10*3/uL (ref 150.0–400.0)
RBC: 3.98 Mil/uL (ref 3.87–5.11)
RDW: 14.1 % (ref 11.5–15.5)
WBC: 8 10*3/uL (ref 4.0–10.5)

## 2014-09-26 LAB — BASIC METABOLIC PANEL
BUN: 19 mg/dL (ref 6–23)
CALCIUM: 9.8 mg/dL (ref 8.4–10.5)
CHLORIDE: 100 meq/L (ref 96–112)
CO2: 27 meq/L (ref 19–32)
Creatinine, Ser: 1 mg/dL (ref 0.4–1.2)
GFR: 61.18 mL/min (ref >60.00)
GLUCOSE: 94 mg/dL (ref 70–99)
Potassium: 3.6 mEq/L (ref 3.5–5.1)
SODIUM: 132 meq/L — AB (ref 135–145)

## 2014-09-26 LAB — IBC PANEL
Iron: 94 ug/dL (ref 42–145)
SATURATION RATIOS: 24.3 % (ref 20.0–50.0)
Transferrin: 276.1 mg/dL (ref 212.0–360.0)
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
## 2014-09-26 NOTE — Progress Notes (Signed)
Pre visit review using our clinic review tool, if applicable. No additional management support is needed unless otherwise documented below in the visit note. 

## 2014-09-26 NOTE — Assessment & Plan Note (Addendum)
Minor last visit, for f/u today with iron check, to f/u any worsening symptoms or concerns, suspect anemia chronic dz Lab Results  Component Value Date   WBC 9.3 05/07/2014   HGB 11.9* 05/07/2014   HCT 35.2* 05/07/2014   MCV 95.3 05/07/2014   PLT 377.0 05/07/2014

## 2014-09-26 NOTE — Patient Instructions (Addendum)
You had the Pneumovax shot today  You are given the prescription order for the compression stockings  Please continue all other medications as before, and refills have been done if requested.  Please have the pharmacy call with any other refills you may need.  Please continue your efforts at being more active, low cholesterol diet, and weight control.  You are otherwise up to date with prevention measures today.  Please keep your appointments with your specialists as you may have planned - rheumatology  Please go to the LAB in the Basement (turn left off the elevator) for the tests to be done today  You will be contacted by phone if any changes need to be made immediately.  Otherwise, you will receive a letter about your results with an explanation, but please check with MyChart first.  Please return in 6 months, or sooner if needed

## 2014-09-28 NOTE — Assessment & Plan Note (Signed)
?  rot cuff injury, for sport medicine referral, pain control,  to f/u any worsening symptoms or concerns

## 2014-09-28 NOTE — Assessment & Plan Note (Signed)
stable overall by history and exam, recent data reviewed with pt, and pt to continue medical treatment as before,  to f/u any worsening symptoms or concerns Lab Results  Component Value Date   WBC 8.0 09/26/2014   HGB 12.5 09/26/2014   HCT 37.8 09/26/2014   PLT 354.0 09/26/2014   GLUCOSE 94 09/26/2014   CHOL 118 03/28/2014   TRIG 118.0 03/28/2014   HDL 36.20* 03/28/2014   LDLCALC 58 03/28/2014   ALT 18 04/04/2013   AST 23 04/04/2013   NA 132* 09/26/2014   K 3.6 09/26/2014   CL 100 09/26/2014   CREATININE 1.0 09/26/2014   BUN 19 09/26/2014   CO2 27 09/26/2014   TSH 2.99 03/28/2014

## 2014-09-28 NOTE — Assessment & Plan Note (Signed)
stable overall by history and exam, recent data reviewed with pt, and pt to continue medical treatment as before,  to f/u any worsening symptoms or concerns BP Readings from Last 3 Encounters:  09/26/14 122/80  06/27/14 126/84  05/14/14 122/78

## 2014-11-10 ENCOUNTER — Telehealth: Payer: Self-pay | Admitting: Internal Medicine

## 2014-11-10 NOTE — Telephone Encounter (Signed)
Patient cannot take antibiotic while on methotrexate. She is currently using saline solution to clear up congestion. She is asking if there is an alternate med to antibiotic that can be used with methotrexate.

## 2014-11-11 ENCOUNTER — Ambulatory Visit: Payer: Medicare Other | Admitting: Internal Medicine

## 2014-11-11 NOTE — Telephone Encounter (Signed)
Called left message to call back 

## 2014-11-11 NOTE — Telephone Encounter (Signed)
Very sorry, but I dont see any documentation of need for antibiotic recently.  Not sure why she would be taking anything at this time  Please consider OV , as office policy is to not prescribe antibx without OV

## 2014-11-12 NOTE — Telephone Encounter (Signed)
Patient informed of MD instructions.  She states she is not on an antibiotic at this time but having some sinus congestion.  But since the first call was made is feeling somewhat better now. Will schedule appt. If does not get better.

## 2014-12-15 ENCOUNTER — Ambulatory Visit (INDEPENDENT_AMBULATORY_CARE_PROVIDER_SITE_OTHER)
Admission: RE | Admit: 2014-12-15 | Discharge: 2014-12-15 | Disposition: A | Discharge status: Home / Self Care | Payer: Medicare Other | Source: Ambulatory Visit | Attending: Family Medicine | Admitting: Family Medicine

## 2014-12-15 ENCOUNTER — Ambulatory Visit (INDEPENDENT_AMBULATORY_CARE_PROVIDER_SITE_OTHER): Payer: Medicare Other | Admitting: Family Medicine

## 2014-12-15 ENCOUNTER — Encounter: Payer: Self-pay | Admitting: Family Medicine

## 2014-12-15 ENCOUNTER — Other Ambulatory Visit (INDEPENDENT_AMBULATORY_CARE_PROVIDER_SITE_OTHER): Payer: Medicare Other

## 2014-12-15 VITALS — HR 73 | Wt 160.0 lb

## 2014-12-15 DIAGNOSIS — M25511 Pain in right shoulder: Secondary | ICD-10-CM

## 2014-12-15 DIAGNOSIS — M129 Arthropathy, unspecified: Secondary | ICD-10-CM

## 2014-12-15 DIAGNOSIS — M19011 Primary osteoarthritis, right shoulder: Secondary | ICD-10-CM | POA: Insufficient documentation

## 2014-12-15 NOTE — Progress Notes (Signed)
Pre visit review using our clinic review tool, if applicable. No additional management support is needed unless otherwise documented below in the visit note. 

## 2014-12-15 NOTE — Assessment & Plan Note (Signed)
I'm anticipating patient having severe osteophytic changes of the shoulder and x-rays are pending. Patient's ultrasound is fairly unremarkable except for the arthritis and actually does have rotator cuff intact.

## 2014-12-15 NOTE — Patient Instructions (Addendum)
Good to see you.   Exercises 3 times a week.  Ice 20 minutes 2 times daily. Usually after activity and before bed. Get xrays downstairs today.  Take tylenol 650 mg three times a day is the best evidence based medicine we have for arthritis.  Vitamin D 3000 IU daily Fish oil 2 grams daily.  Tumeric 500mg  twice daily.  Capsaicin topically up to four times a day may also help with pain. Cortisone injections are an option if these interventions do not seem to make a difference or need more relief.  Come back and see me in 3 weeks.

## 2014-12-15 NOTE — Progress Notes (Signed)
[name] [address] [place], [place] [address] Phone: [phone] Subjective:    I'm seeing this patient by the request  of:  Cathlean Cower, MD   CC: Right shoulder pain  [id number] [name] is a 69 y.o. female coming in with complaint of right shoulder pain. Patient states that she's had this pain for multiple months. Patient is being treated for rheumatoid arthritis and has had many different joint pains but right now her right shoulder seems to be worse. Patient describes it as a dull throbbing aching sensation that is not responding to ibuprofen. Patient states it does help with some icing. Notices some mild weakness as well as may be some loss of range of motion making it difficult for some activities of daily living such as dressing herself. Patient describes the pain as more of a 6 out of 10 in severity but denies any significant nighttime awakening.     Past medical history, social, surgical and family history all reviewed in electronic medical record.   Review of Systems: No headache, visual changes, nausea, vomiting, diarrhea, constipation, dizziness, abdominal pain, skin rash, fevers, chills, night sweats, weight loss, swollen lymph nodes, body aches, joint swelling, muscle aches, chest pain, shortness of breath, mood changes.   Objective Pulse 73, weight 160 lb (72.576 kg), SpO2 95 %.  General: No apparent distress alert and oriented x3 mood and affect normal, dressed appropriately.  HEENT: Pupils equal, extraocular movements intact  Respiratory: Patient's speak in full sentences and does not appear short of breath  Cardiovascular: No lower extremity edema, non tender, no erythema  Skin: Warm dry intact with no signs of infection or rash on extremities or on axial skeleton.  Abdomen: Soft nontender  Neuro: Cranial nerves II through XII are intact, neurovascularly intact in all extremities with 2+ DTRs and 2+ pulses.  Lymph: No  lymphadenopathy of posterior or anterior cervical chain or axillae bilaterally.  Gait normal with good balance and coordination.  MSK:  Non tender with full range of motion and good stability and symmetric strength and tone of  elbows, wrist, hip, knee and ankles bilaterally. Moderate arthritic changes of multiple joints with mild cogwheeling. Shoulder: Right Inspection reveals no abnormalities, atrophy or asymmetry. Palpation is normal with no tenderness over AC joint or bicipital groove. Decreased range of motion in [place] flexion or 140, internal rotation to lateral hip Rotation of 25 compared to full range of motion on the contralateral side. Mild cogwheeling bilaterally. Rotator cuff strength 4 out of 5 signs of impingement with positive Neer and Hawkin's tests, but negative empty can sign. Speeds and Yergason's tests normal. No labral pathology noted with negative Obrien's, negative clunk and good stability. Normal scapular function observed. No painful arc and no drop arm sign. No apprehension sign  MSK US performed of: Right This study was ordered, performed, and interpreted by Charlann Boxer D.O.  Shoulder:   Supraspinatus:  Rheumatoid nodule noted Bursal bulge seen with shoulder abduction on impingement view. Infraspinatus:  Degenerative changes noted but no acute tears. Significant increase in Doppler flow Subscapularis:  Appears normal on long and transverse views. Positive bursa Teres Minor:  Appears normal on long and transverse views. AC joint:  Mild arthritis Glenohumeral Joint:  Moderate to severe arthritis Glenoid Labrum:  Intact without visualized tears. Biceps Tendon:  Appears normal on long and transverse views, no fraying of tendon, tendon located in intertubercular groove, no subluxation with shoulder internal or external rotation.  Impression: Subacromial bursitis, moderate to severe arthritis and rheumatoid nodule near supraspinatus tendon  Procedure: Real-time  Ultrasound Guided Injection of right glenohumeral joint Device: GE Logiq E  Ultrasound guided injection is preferred based studies that show increased duration, increased effect, greater accuracy, decreased procedural pain, increased response rate with ultrasound guided versus blind injection.  Verbal informed consent obtained.  Time-out conducted.  Noted no overlying erythema, induration, or other signs of local infection.  Skin prepped in a sterile fashion.  Local anesthesia: Topical Ethyl chloride.  With sterile technique and under real time ultrasound guidance:  Joint visualized.  23g 1  inch needle inserted posterior approach. Pictures taken for needle placement. Patient did have injection of 2 cc of 1% lidocaine, 2 cc of 0.5% Marcaine, and 1.0 cc of Kenalog 40 mg/dL. Completed without difficulty  Pain immediately resolved suggesting accurate placement of the medication.  Advised to call if fevers/chills, erythema, induration, drainage, or persistent bleeding.  Images permanently stored and available for review in the ultrasound unit.  Impression: Technically successful ultrasound guided injection.     Impression and Recommendations:     This case required medical decision making of moderate complexity.

## 2014-12-16 ENCOUNTER — Other Ambulatory Visit: Payer: Self-pay | Admitting: Internal Medicine

## 2014-12-17 ENCOUNTER — Other Ambulatory Visit: Payer: Self-pay | Admitting: *Deleted

## 2014-12-17 ENCOUNTER — Telehealth: Payer: Self-pay | Admitting: Internal Medicine

## 2014-12-17 DIAGNOSIS — I10 Essential (primary) hypertension: Secondary | ICD-10-CM

## 2014-12-17 MED ORDER — METOPROLOL TARTRATE 25 MG PO TABS: 25.0000 mg | ORAL_TABLET | Freq: Two times a day (BID) | ORAL | Status: DC

## 2014-12-17 MED ORDER — HYDROCHLOROTHIAZIDE 12.5 MG PO CAPS: 12.5000 mg | ORAL_CAPSULE | Freq: Every day | ORAL | Status: DC

## 2014-12-17 MED ORDER — TOPIRAMATE 50 MG PO TABS: 50.0000 mg | ORAL_TABLET | Freq: Two times a day (BID) | ORAL | Status: DC

## 2014-12-17 NOTE — Telephone Encounter (Signed)
Tell her significant less arthritis then we thought, which is a great thing!

## 2014-12-17 NOTE — Telephone Encounter (Signed)
Patient would like the results of the Xray's she took on 12/15/14.

## 2014-12-17 NOTE — Telephone Encounter (Signed)
Discussed with pt

## 2014-12-19 ENCOUNTER — Telehealth: Payer: Self-pay | Admitting: Internal Medicine

## 2014-12-19 NOTE — Telephone Encounter (Signed)
emmi mailed  °

## 2015-01-05 ENCOUNTER — Ambulatory Visit (INDEPENDENT_AMBULATORY_CARE_PROVIDER_SITE_OTHER): Payer: Medicare Other | Admitting: Family Medicine

## 2015-01-05 ENCOUNTER — Encounter: Payer: Self-pay | Admitting: Family Medicine

## 2015-01-05 VITALS — BP 126/84 | HR 76 | Ht 65.0 in | Wt 158.0 lb

## 2015-01-05 DIAGNOSIS — M25511 Pain in right shoulder: Secondary | ICD-10-CM

## 2015-01-05 NOTE — Progress Notes (Signed)
  [name] [place], [place] [address] Phone: [phone] Subjective:     CC: Right shoulder pain follow up  [id number] [name] is a 69 y.o. female coming in with complaint of right shoulder pain. Patient states that she's had this pain for multiple months. Patient is being treated for rheumatoid arthritis and has had many different joint pains. Patient was having severe right shoulder pain. Patient was found to have a rotator cuff tendinopathy with a rheumatoid nodule. Patient was also found to have moderate osteophytic changes on the ultrasound. Patient's x-rays were fairly unremarkable. Patient was given an injection, topical anti-inflammatories, home exercises and icing protocol. Patient states shoulders feeling much better overall. Patient states that actually she fell contralateral side and has had more discomfort from that. Patient states it is more of a dull aching sensation and no broken bones or swelling noted. Patient states that her right shoulder has not given her trouble since he injection.   Past medical history, social, surgical and family history all reviewed in electronic medical record.   Review of Systems: No headache, visual changes, nausea, vomiting, diarrhea, constipation, dizziness, abdominal pain, skin rash, fevers, chills, night sweats, weight loss, swollen lymph nodes, body aches, joint swelling, muscle aches, chest pain, shortness of breath, mood changes.   Objective Blood pressure 126/84, pulse 76, height 5\' 5"  (1.651 m), weight 158 lb (71.668 kg), SpO2 96 %.  General: No apparent distress alert and oriented x3 mood and affect normal, dressed appropriately.  HEENT: Pupils equal, extraocular movements intact  Respiratory: Patient's speak in full sentences and does not appear short of breath  Cardiovascular: No lower extremity edema, non tender, no erythema  Skin: Warm dry intact with no signs of infection or  rash on extremities or on axial skeleton.  Abdomen: Soft nontender  Neuro: Cranial nerves II through XII are intact, neurovascularly intact in all extremities with 2+ DTRs and 2+ pulses.  Lymph: No lymphadenopathy of posterior or anterior cervical chain or axillae bilaterally.  Gait normal with good balance and coordination.  MSK:  Non tender with full range of motion and good stability and symmetric strength and tone of  elbows, wrist, hip, knee and ankles bilaterally. Moderate arthritic changes of multiple joints with mild cogwheeling. Shoulder: Right Inspection reveals no abnormalities, atrophy or asymmetry. Palpation is normal with no tenderness over AC joint or bicipital groove. Decreased range of motion in forward flexion or 160, internal rotation to sacrum  compared to full range of motion on the contralateral side. Mild cogwheeling bilaterally. Rotator cuff strength 4 out of 5 signs of impingement with positive Neer and Hawkin's tests, but negative empty can sign. Speeds and Yergason's tests normal. No labral pathology noted with negative Obrien's, negative clunk and good stability. Normal scapular function observed. No painful arc and no drop arm sign. No apprehension sign     Impression and Recommendations:     This case required medical decision making of moderate complexity.

## 2015-01-05 NOTE — Assessment & Plan Note (Signed)
She is doing much better at this time. Discussed with patient to continue the home exercises in the icing protocol. We discussed that patient should do this at the left shoulder is well due to the contusion. Patient though is going to do much better and think with conservative therapy can follow-up with me more on an as-needed basis. We discussed making a follow-up appointment in 3 weeks encase patient's left side did not seem to make an improvement. I do think that the underlying rheumatoid does play a conjoining factor do most of patient's joint pain.

## 2015-01-05 NOTE — Progress Notes (Signed)
Pre visit review using our clinic review tool, if applicable. No additional management support is needed unless otherwise documented below in the visit note. 

## 2015-01-05 NOTE — Patient Instructions (Signed)
Good to see you Ice is your friend especially at night Continue the vitamins Try to stay upright :) Consider iron 325mg  3 times a week. If bone pain form fall.  is not much better come back in 3 weeks but I think you will do great

## 2015-01-13 ENCOUNTER — Telehealth: Payer: Self-pay | Admitting: Internal Medicine

## 2015-01-13 ENCOUNTER — Telehealth: Payer: Self-pay | Admitting: *Deleted
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
## 2015-01-13 NOTE — Telephone Encounter (Signed)
PLEASE NOTE: All timestamps contained within this report are represented as [place] Standard Time. CONFIDENTIALTY NOTICE: This fax transmission is intended only for the addressee. It contains information that is legally privileged, confidential or otherwise protected from use or disclosure. If you are not the intended recipient, you are strictly prohibited from reviewing, disclosing, copying using or disseminating any of this information or taking any action in reliance on or regarding this information. If you have received this fax in error, please notify us immediately by telephone so that we can arrange for its return to [place]. Phone: [phone], Toll-Free: [phone], Fax: [phone] Page: 1 of 1 Call Id: [id number] [facility] [place] Patient Name: [name] DOB: 01-Jan-1946 Initial Comment caller states she has been having jerking in her stomach - it woke her up from her nap and gave her tremors in her neck and head - and then slept for a long time Nurse Assessment Nurse: Marcelline Deist, RN, Kermit Balo Date/Time ([place] Time): 01/13/2015 12:05:43 PM Confirm and document reason for call. If symptomatic, describe symptoms. ---Caller states she has been having a sudden pressure in the middle of her stomach, it woke her up from her nap and gave her tremors in her neck and head, and then slept for a long time. It is unusual for her to sleep a long time & lost track of time. [place] about a week ago, hit the floor with her body, not sure how or why. This happened a couple years ago as well. Has had tremors all her life, this has been checked out. Not sure if she has a hx of seizures. Has the patient traveled out of the country within the last 30 days? ---Not Applicable Does the patient require triage? ---Yes Related visit to physician within the last 2 weeks? ---No Does the PT have any chronic conditions? (i.e.  diabetes, asthma, etc.) ---Yes List chronic conditions. ---on BP rx, tremors Guidelines Guideline Title Affirmed Question Affirmed Notes No Guideline or Reference Available Nursing judgment Final Disposition User See Physician within [place], RN, Kermit Balo Comments Caller states she has no symptoms currently, but over the last several days she has had a feeling in her head/across her head that doesn't feel right. Does not describe it as a headache or dizziness. She is perplexed why she is sleeping so long after one of these episodes with stomach pressure. States the stomach pressure episodes wake her up, but don't last.

## 2015-01-14 ENCOUNTER — Ambulatory Visit (INDEPENDENT_AMBULATORY_CARE_PROVIDER_SITE_OTHER): Payer: Medicare Other | Admitting: Internal Medicine

## 2015-01-14 ENCOUNTER — Encounter: Payer: Self-pay | Admitting: Internal Medicine

## 2015-01-14 VITALS — BP 132/78 | HR 68 | Temp 98.2°F | Ht 65.0 in | Wt 156.5 lb

## 2015-01-14 DIAGNOSIS — K589 Irritable bowel syndrome without diarrhea: Secondary | ICD-10-CM | POA: Diagnosis not present

## 2015-01-14 NOTE — Progress Notes (Signed)
   Subjective:    Patient ID: [name], female    DOB: July 30, 1946, 69 y.o.   MRN: [id number]  HPI On 3/27 after resting she felt "jerking in her stomach" w/o pain then "tremors in her head". She slept for 4 hours after the symptoms. She denied any associated bloating. She did have some loose stool after the event. She had a similar episode several months ago. She had no other associated GI, GU, or gynecologic symptoms.  She did fall 01/02/15 having possibly tripped over her daughter's pet dog. She did abrade the lip on the carpet and struck her left hip and knee but not her head. She has complete memory for the event. There was no loss of consciousness. There was no cardiac or neurologic prodrome prior to that fall.   She has had a hysterectomy. She sees her gynecologist annually.  A colonoscopy will be performed later this year.  She sees Dr. Carles Collet for her head tremors. She's on Topamax twice a day.  She has history of rheumatoid arthritis controlled with medicines.   Review of Systems Unexplained weight loss, abdominal pain, significant dyspepsia, dysphagia, melena, rectal bleeding, or persistently small caliber stools are denied. Dysuria, pyuria, hematuria, frequency, nocturia or polyuria are denied.    Objective:   Physical Exam  Positive or pertinent findings include: She has a subtle head tremor.  There is ptosis of the eyelids, greater on the left than the right.  An S4 is noted.  Valgus deformity of the lower extremities is noted.  She has no neuro muscular deficit.   General appearance :adequately nourished; in no distress. Eyes: No conjunctival inflammation or scleral icterus is present. Oral exam:  Lips and gums are healthy appearing.There is no oropharyngeal erythema or exudate noted. Dental hygiene is good. Heart:  Normal rate and regular rhythm. S1 and S2 normal without gallop, murmur, click, or rub . Lungs:Chest clear to auscultation; no wheezes,  rhonchi,rales ,or rubs present.No increased work of breathing.  Abdomen: bowel sounds normal, soft and non-tender without masses, organomegaly or hernias noted.  No guarding or rebound. No flank tenderness to percussion. Vascular : all pulses equal ; no bruits present. Skin:Warm & dry.  Intact without suspicious lesions or rashes ; no tenting or jaundice  Lymphatic: No lymphadenopathy is noted about the head, neck, axilla Neuro: Strength, tone & DTRs normal.        Assessment & Plan:  #1 intra-abdominal "jerking" followed by some loose stool. This suggest a variant of irritable bowel syndrome.History obtained & exam do not suggest serious issue such as ovarian cancer.  Plan: See after visit summary

## 2015-01-14 NOTE — Patient Instructions (Signed)
Please take a probiotic , Florastor OR Align, every day if the bowels are loose. This will replace the normal bacteria which  are necessary for formation of normal stool and processing of food. Please keep a food diary of possible food triggers for the internal jerking by recording the contents of the previous meal. For example fatty or greasy foods might exacerbate gallbladder disease. Other food triggers for bowel dysfunction include lactose (milk sugar) or wheat / gluten which can cause Sprue.

## 2015-01-14 NOTE — Progress Notes (Signed)
Pre visit review using our clinic review tool, if applicable. No additional management support is needed unless otherwise documented below in the visit note. 

## 2015-01-19 ENCOUNTER — Telehealth: Payer: Self-pay | Admitting: Family Medicine

## 2015-01-19 NOTE — Telephone Encounter (Signed)
Spoke with pt & per dr Tamala Julian it is okay for her to take tylenol arthritis.

## 2015-01-19 NOTE — Telephone Encounter (Signed)
Patient called in stating that Dr. Tamala Julian told her she could take tylenol.  She would like to know if she can take arthritis tylenol?

## 2015-01-26 ENCOUNTER — Other Ambulatory Visit: Payer: Self-pay | Admitting: Internal Medicine

## 2015-03-04 ENCOUNTER — Encounter (INDEPENDENT_AMBULATORY_CARE_PROVIDER_SITE_OTHER): Payer: Medicare Other | Admitting: Ophthalmology

## 2015-03-04 DIAGNOSIS — M0609 Rheumatoid arthritis without rheumatoid factor, multiple sites: Secondary | ICD-10-CM

## 2015-03-04 DIAGNOSIS — Z79899 Other long term (current) drug therapy: Secondary | ICD-10-CM | POA: Diagnosis not present

## 2015-03-04 DIAGNOSIS — H3531 Nonexudative age-related macular degeneration: Secondary | ICD-10-CM | POA: Diagnosis not present

## 2015-03-04 DIAGNOSIS — H35033 Hypertensive retinopathy, bilateral: Secondary | ICD-10-CM

## 2015-03-04 DIAGNOSIS — I10 Essential (primary) hypertension: Secondary | ICD-10-CM | POA: Diagnosis not present

## 2015-03-04 DIAGNOSIS — H43813 Vitreous degeneration, bilateral: Secondary | ICD-10-CM

## 2015-03-11 ENCOUNTER — Telehealth: Payer: Self-pay | Admitting: Internal Medicine

## 2015-03-11 NOTE — Telephone Encounter (Signed)
Pt called in and wanted to know if orders could be put in to go to lab to do UA.  She thinks she has a uti?   Can she do that?   Best number [phone]

## 2015-03-11 NOTE — Telephone Encounter (Signed)
Better would be to see Toniann Ket NP if needs to be seen soon for this, thanks

## 2015-03-12 ENCOUNTER — Other Ambulatory Visit: Payer: Self-pay | Admitting: Internal Medicine

## 2015-03-13 ENCOUNTER — Ambulatory Visit (INDEPENDENT_AMBULATORY_CARE_PROVIDER_SITE_OTHER): Payer: Medicare Other | Admitting: Internal Medicine

## 2015-03-13 ENCOUNTER — Other Ambulatory Visit (INDEPENDENT_AMBULATORY_CARE_PROVIDER_SITE_OTHER): Payer: Medicare Other

## 2015-03-13 ENCOUNTER — Encounter: Payer: Self-pay | Admitting: Internal Medicine

## 2015-03-13 VITALS — BP 148/86 | HR 72 | Temp 98.2°F | Resp 15 | Wt 161.0 lb

## 2015-03-13 DIAGNOSIS — R350 Frequency of micturition: Secondary | ICD-10-CM

## 2015-03-13 DIAGNOSIS — R509 Fever, unspecified: Secondary | ICD-10-CM | POA: Diagnosis not present

## 2015-03-13 LAB — URINALYSIS, ROUTINE W REFLEX MICROSCOPIC
BILIRUBIN URINE: NEGATIVE
Hgb urine dipstick: NEGATIVE
KETONES UR: NEGATIVE
Nitrite: NEGATIVE
SPECIFIC GRAVITY, URINE: 1.01 (ref 1.000–1.030)
Total Protein, Urine: NEGATIVE
URINE GLUCOSE: NEGATIVE
UROBILINOGEN UA: 0.2 (ref 0.0–1.0)
pH: 7 (ref 5.0–8.0)

## 2015-03-13 MED ORDER — NITROFURANTOIN MONOHYD MACRO 100 MG PO CAPS: 100.0000 mg | ORAL_CAPSULE | Freq: Two times a day (BID) | ORAL | Status: DC

## 2015-03-13 NOTE — Progress Notes (Signed)
Pre visit review using our clinic review tool, if applicable. No additional management support is needed unless otherwise documented below in the visit note. 

## 2015-03-13 NOTE — Patient Instructions (Signed)
Drink as much nondairy fluids as possible. Avoid spicy foods or alcohol as  these may aggravate the bladder. Do not take decongestants. Avoid narcotics if possible. 

## 2015-03-13 NOTE — Progress Notes (Signed)
   Subjective:    Patient ID: [name], female    DOB: July 07, 1946, 69 y.o.   MRN: [id number]  HPI  Her symptoms began 03/10/15 as fever and chills. She did not actually take her temperature. She also had lumbosacral area pain across the lower back. Over the next 3 days she's had oliguria, frequency, hesitancy, and occasional urgency.  She has no past history of kidney stones or genitourinary anomalies. She's never had a cystoscopy.  Her only urine culture on record 04/24/14 revealed no growth.  She has multiple drug allergies including Cipro, doxycycline, and sulfa.  Review of Systems She has not had dysuria, hematuria, or pyuria.    Objective:   Physical Exam  Pertinent or positive findings include: She has a resting tremor of her head.  There is a grade 1 systolic murmur at the base. Second heart sound is increased.  She is slightly tender to percussion over the mid lumbosacral spine.  She has suprapubic tenderness to palpation.  General appearance :adequately nourished; in no distress. Eyes: No conjunctival inflammation or scleral icterus is present. Oral exam:  Lips and gums are healthy appearing.There is no oropharyngeal erythema or exudate noted. Dental hygiene is good. Heart:  Normal rate and regular rhythm. S1 normal without gallop, click, rub or other extra sounds   Lungs:Chest clear to auscultation; no wheezes, rhonchi,rales ,or rubs present.No increased work of breathing.  Abdomen: bowel sounds normal, soft and non-tender without masses, organomegaly or hernias noted.  No guarding or rebound. No flank tenderness to percussion. Vascular : all pulses equal ; no bruits present. Skin:Warm & dry.  Intact without suspicious lesions or rashes ; no tenting or jaundice  Lymphatic: No lymphadenopathy is noted about the head, neck, axilla Neuro: Strength, tone  normal.         Assessment & Plan:  #1 urgency, hesitancy, frequency, and oliguria  #2 fever  Plan: See  orders recommendations

## 2015-03-15 LAB — URINE CULTURE

## 2015-03-17 ENCOUNTER — Other Ambulatory Visit: Payer: Self-pay | Admitting: Internal Medicine

## 2015-03-17 DIAGNOSIS — R350 Frequency of micturition: Secondary | ICD-10-CM

## 2015-03-27 ENCOUNTER — Encounter: Payer: Self-pay | Admitting: Internal Medicine

## 2015-03-31 ENCOUNTER — Encounter: Payer: Self-pay | Admitting: Internal Medicine

## 2015-03-31 ENCOUNTER — Ambulatory Visit (INDEPENDENT_AMBULATORY_CARE_PROVIDER_SITE_OTHER)
Admission: RE | Admit: 2015-03-31 | Discharge: 2015-03-31 | Disposition: A | Discharge status: Home / Self Care | Payer: Medicare Other | Source: Ambulatory Visit | Attending: Internal Medicine | Admitting: Internal Medicine

## 2015-03-31 ENCOUNTER — Other Ambulatory Visit (INDEPENDENT_AMBULATORY_CARE_PROVIDER_SITE_OTHER): Payer: Medicare Other

## 2015-03-31 ENCOUNTER — Ambulatory Visit (INDEPENDENT_AMBULATORY_CARE_PROVIDER_SITE_OTHER): Payer: Medicare Other | Admitting: Internal Medicine

## 2015-03-31 VITALS — BP 134/80 | HR 65 | Temp 98.2°F | Ht 65.0 in | Wt 160.0 lb

## 2015-03-31 DIAGNOSIS — M545 Low back pain, unspecified: Secondary | ICD-10-CM | POA: Insufficient documentation

## 2015-03-31 DIAGNOSIS — Z0000 Encounter for general adult medical examination without abnormal findings: Secondary | ICD-10-CM | POA: Diagnosis not present

## 2015-03-31 LAB — CBC WITH DIFFERENTIAL/PLATELET
BASOS PCT: 0.8 % (ref 0.0–3.0)
Basophils Absolute: 0.1 10*3/uL (ref 0.0–0.1)
EOS PCT: 0 % (ref 0.0–5.0)
Eosinophils Absolute: 0 10*3/uL (ref 0.0–0.7)
HCT: 35.6 % — ABNORMAL LOW (ref 36.0–46.0)
Hemoglobin: 12.2 g/dL (ref 12.0–15.0)
Lymphocytes Relative: 23.1 % (ref 12.0–46.0)
Lymphs Abs: 2 10*3/uL (ref 0.7–4.0)
MCHC: 34.3 g/dL (ref 30.0–36.0)
MCV: 96 fl (ref 78.0–100.0)
Monocytes Absolute: 0.7 10*3/uL (ref 0.1–1.0)
Monocytes Relative: 7.8 % (ref 3.0–12.0)
NEUTROS PCT: 68.3 % (ref 43.0–77.0)
Neutro Abs: 5.9 10*3/uL (ref 1.4–7.7)
Platelets: 321 10*3/uL (ref 150.0–400.0)
RBC: 3.71 Mil/uL — AB (ref 3.87–5.11)
RDW: 13.3 % (ref 11.5–15.5)
WBC: 8.7 10*3/uL (ref 4.0–10.5)

## 2015-03-31 LAB — BASIC METABOLIC PANEL
BUN: 12 mg/dL (ref 6–23)
CO2: 28 meq/L (ref 19–32)
CREATININE: 0.98 mg/dL (ref 0.40–1.20)
Calcium: 9.7 mg/dL (ref 8.4–10.5)
Chloride: 97 mEq/L (ref 96–112)
GFR: 59.65 mL/min — ABNORMAL LOW (ref >60.00)
GLUCOSE: 102 mg/dL — AB (ref 70–99)
POTASSIUM: 3.7 meq/L (ref 3.5–5.1)
Sodium: 131 mEq/L — ABNORMAL LOW (ref 135–145)

## 2015-03-31 LAB — URINALYSIS, ROUTINE W REFLEX MICROSCOPIC
BILIRUBIN URINE: NEGATIVE
HGB URINE DIPSTICK: NEGATIVE
KETONES UR: NEGATIVE
Nitrite: NEGATIVE
PH: 6.5 (ref 5.0–8.0)
Specific Gravity, Urine: 1.01 (ref 1.000–1.030)
TOTAL PROTEIN, URINE-UPE24: NEGATIVE
Urine Glucose: NEGATIVE
Urobilinogen, UA: 0.2 (ref 0.0–1.0)

## 2015-03-31 LAB — LIPID PANEL
CHOLESTEROL: 116 mg/dL (ref 0–200)
HDL: 37.3 mg/dL — AB (ref >39.00)
LDL CALC: 54 mg/dL (ref 0–99)
NonHDL: 78.7
TRIGLYCERIDES: 124 mg/dL (ref 0.0–149.0)
Total CHOL/HDL Ratio: 3
VLDL: 24.8 mg/dL (ref 0.0–40.0)

## 2015-03-31 LAB — HEPATIC FUNCTION PANEL
ALT: 17 U/L (ref 0–35)
AST: 21 U/L (ref 0–37)
Albumin: 4.2 g/dL (ref 3.5–5.2)
Alkaline Phosphatase: 66 U/L (ref 39–117)
BILIRUBIN DIRECT: 0.1 mg/dL (ref 0.0–0.3)
BILIRUBIN TOTAL: 0.3 mg/dL (ref 0.2–1.2)
Total Protein: 7 g/dL (ref 6.0–8.3)

## 2015-03-31 LAB — TSH: TSH: 3.73 u[IU]/mL (ref 0.35–4.50)
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
## 2015-03-31 NOTE — Patient Instructions (Signed)
Please continue all other medications as before, and refills have been done if requested.  Please have the pharmacy call with any other refills you may need.  Please continue your efforts at being more active, low cholesterol diet, and weight control.  You are otherwise up to date with prevention measures today.  Please keep your appointments with your specialists as you may have planned  Please go to the XRAY Department in the Basement (go straight as you get off the elevator) for the x-ray testing  Please go to the LAB in the Basement (turn left off the elevator) for the tests to be done today  You will be contacted by phone if any changes need to be made immediately.  Otherwise, you will receive a letter about your results with an explanation, but please check with MyChart first.  Please return in 6 months, or sooner if needed

## 2015-03-31 NOTE — Progress Notes (Signed)
Pre visit review using our clinic review tool, if applicable. No additional management support is needed unless otherwise documented below in the visit note. 

## 2015-03-31 NOTE — Assessment & Plan Note (Signed)
No gu symptoms, recent neg eval d/w pt, likely back pain related to more symtpomatic lumber djd/ddd, pt delclines pain med for now, has never has lumbar films - will hcekc in light of other hx of RA to r/o other

## 2015-03-31 NOTE — Assessment & Plan Note (Signed)

## 2015-04-01 ENCOUNTER — Telehealth: Payer: Self-pay | Admitting: Internal Medicine

## 2015-04-01 NOTE — Telephone Encounter (Signed)
Please call patient regarding her urinalysis.

## 2015-04-01 NOTE — Telephone Encounter (Signed)
Pt advised.

## 2015-04-16 ENCOUNTER — Telehealth: Payer: Self-pay | Admitting: Internal Medicine

## 2015-04-16 NOTE — Telephone Encounter (Signed)
Got scheduled  °

## 2015-04-16 NOTE — Telephone Encounter (Signed)
Patient states her heartbeat will skip and every now and then she gets short of breath.  She was wondering both was related.  I did tell patient that she would need to schedule an appointment.  Patient is still requesting a call in regards.

## 2015-04-16 NOTE — Telephone Encounter (Signed)
This is possible, but OV would be needed to see if incresased beta blocker is possible to help control this

## 2015-04-22 ENCOUNTER — Encounter: Payer: Self-pay | Admitting: Internal Medicine

## 2015-04-22 ENCOUNTER — Ambulatory Visit (INDEPENDENT_AMBULATORY_CARE_PROVIDER_SITE_OTHER): Payer: Medicare Other | Admitting: Internal Medicine

## 2015-04-22 VITALS — BP 132/76 | HR 71 | Temp 97.6°F | Ht 65.0 in | Wt 161.0 lb

## 2015-04-22 DIAGNOSIS — I10 Essential (primary) hypertension: Secondary | ICD-10-CM | POA: Diagnosis not present

## 2015-04-22 DIAGNOSIS — R002 Palpitations: Secondary | ICD-10-CM

## 2015-04-22 DIAGNOSIS — G243 Spasmodic torticollis: Secondary | ICD-10-CM

## 2015-04-22 MED ORDER — METOPROLOL TARTRATE 50 MG PO TABS: 50.0000 mg | ORAL_TABLET | Freq: Two times a day (BID) | ORAL | Status: DC

## 2015-04-22 NOTE — Patient Instructions (Signed)
Ok to increase the metoprolol to 50 mg twice per day  Please continue all other medications as before, and refills have been done if requested.  Please have the pharmacy call with any other refills you may need.  Please continue your efforts at being more active, low cholesterol diet, and weight control.  Please keep your appointments with your specialists as you may have planned
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
## 2015-04-22 NOTE — Assessment & Plan Note (Signed)
Suspect PAC's though cant completely r/o reucrrent afib, will increased metoprolol to 50 bid, pt declines Holter monitor or other eval at this time.  to f/u any worsening symptoms or concerns

## 2015-04-22 NOTE — Assessment & Plan Note (Signed)
stable overall by history and exam, recent data reviewed with pt, and pt to continue medical treatment as before,  to f/u any worsening symptoms or concerns BP Readings from Last 3 Encounters:  04/22/15 132/76  03/31/15 134/80  03/13/15 148/86

## 2015-04-22 NOTE — Progress Notes (Signed)
Pre visit review using our clinic review tool, if applicable. No additional management support is needed unless otherwise documented below in the visit note. 

## 2015-04-22 NOTE — Assessment & Plan Note (Signed)
With tremor, asked pt to return to neuro but does not want botox,  to f/u any worsening symptoms or concerns

## 2015-05-21 ENCOUNTER — Ambulatory Visit: Payer: Medicare Other | Admitting: Podiatry

## 2015-06-05 ENCOUNTER — Telehealth: Payer: Self-pay

## 2015-06-05 NOTE — Telephone Encounter (Signed)
Call to discuss AWV and the patient agreed to come in 9/2 at 11am

## 2015-06-19 ENCOUNTER — Ambulatory Visit (INDEPENDENT_AMBULATORY_CARE_PROVIDER_SITE_OTHER): Payer: Medicare Other

## 2015-06-19 VITALS — BP 150/80 | Ht 65.0 in | Wt 162.0 lb

## 2015-06-19 DIAGNOSIS — R002 Palpitations: Secondary | ICD-10-CM

## 2015-06-19 DIAGNOSIS — Z0000 Encounter for general adult medical examination without abnormal findings: Secondary | ICD-10-CM
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
## 2015-06-19 NOTE — Patient Instructions (Addendum)
Ms. Netzer , Thank you for taking time to come for your Medicare Wellness Visit. I appreciate your ongoing commitment to your health goals. Please review the following plan we discussed and let me know if I can assist you in the future.   Going to do [place];  To schedule mammogram with Dr. Roberts Gaudy insurance nurse told her to "wait" to take the flu; explained that the quicker a community is inoculated against the flu, the better protection everyone will have; review information on preventing the flu.  Will defer until the end of the month  Agrees to have heart monitor; Referral placed to Cardiology on [address] and they will call to set up. If you have chest pain, unrelenting; shortness of breath; or pain to come to the ER.  These are the goals we discussed: Goals    . Exercise 150 minutes per week (moderate activity)     Will be starting to do Tai chi program at the senior center        This is a list of the screening recommended for you and due dates:  Health Maintenance  Topic Date Due  .  Hepatitis C: One time screening is recommended by Center for Disease Control  (CDC) for  adults born from [birth year] through 1966.   07/12/1946  . Flu Shot  05/18/2015  . Mammogram  08/07/2015  . Colon Cancer Screening  11/02/2017  . Tetanus Vaccine  03/28/2024  . DEXA scan (bone density measurement)  Completed  . Shingles Vaccine  Addressed  . Pneumonia vaccines  Completed   Health Maintenance Adopting a healthy lifestyle and getting preventive care can go a long way to promote health and wellness. Talk with your health care provider about what schedule of regular examinations is right for you. This is a good chance for you to check in with your provider about disease prevention and staying healthy. In between checkups, there are plenty of things you can do on your own. Experts have done a lot of research about which lifestyle changes and preventive measures are most likely to keep you  healthy. Ask your health care provider for more information. WEIGHT AND DIET  Eat a healthy diet  Be sure to include plenty of vegetables, fruits, low-fat dairy products, and lean protein.  Do not eat a lot of foods high in solid fats, added sugars, or salt.  Get regular exercise. This is one of the most important things you can do for your health.  Most adults should exercise for at least 150 minutes each week. The exercise should increase your heart rate and make you sweat (moderate-intensity exercise).  Most adults should also do strengthening exercises at least twice a week. This is in addition to the moderate-intensity exercise.  Maintain a healthy weight  Body mass index (BMI) is a measurement that can be used to identify possible weight problems. It estimates body fat based on height and weight. Your health care provider can help determine your BMI and help you achieve or maintain a healthy weight.  For females 79 years of age and older:   A BMI below 18.5 is considered underweight.  A BMI of 18.5 to 24.9 is normal.  A BMI of 25 to 29.9 is considered overweight.  A BMI of 30 and above is considered obese.  Watch levels of cholesterol and blood lipids  You should start having your blood tested for lipids and cholesterol at 70 years of age, then have this  test every 5 years.  You may need to have your cholesterol levels checked more often if:  Your lipid or cholesterol levels are high.  You are older than 70 years of age.  You are at high risk for heart disease.  CANCER SCREENING   Lung Cancer  Lung cancer screening is recommended for adults 73-18 years old who are at high risk for lung cancer because of a history of smoking.  A yearly low-dose CT scan of the lungs is recommended for people who:  Currently smoke.  Have quit within the past 15 years.  Have at least a 30-pack-year history of smoking. A pack year is smoking an average of one pack of cigarettes  a day for 1 year.  Yearly screening should continue until it has been 15 years since you quit.  Yearly screening should stop if you develop a health problem that would prevent you from having lung cancer treatment.  Breast Cancer  Practice breast self-awareness. This means understanding how your breasts normally appear and feel.  It also means doing regular breast self-exams. Let your health care provider know about any changes, no matter how small.  If you are in your 20s or 30s, you should have a clinical breast exam (CBE) by a health care provider every 1-3 years as part of a regular health exam.  If you are 28 or older, have a CBE every year. Also consider having a breast X-ray (mammogram) every year.  If you have a family history of breast cancer, talk to your health care provider about genetic screening.  If you are at high risk for breast cancer, talk to your health care provider about having an MRI and a mammogram every year.  Breast cancer gene (BRCA) assessment is recommended for women who have family members with BRCA-related cancers. BRCA-related cancers include:  Breast.  Ovarian.  Tubal.  Peritoneal cancers.  Results of the assessment will determine the need for genetic counseling and BRCA1 and BRCA2 testing. Cervical Cancer Routine pelvic examinations to screen for cervical cancer are no longer recommended for nonpregnant women who are considered low risk for cancer of the pelvic organs (ovaries, uterus, and vagina) and who do not have symptoms. A pelvic examination may be necessary if you have symptoms including those associated with pelvic infections. Ask your health care provider if a screening pelvic exam is right for you.   The Pap test is the screening test for cervical cancer for women who are considered at risk.  If you had a hysterectomy for a problem that was not cancer or a condition that could lead to cancer, then you no longer need Pap tests.  If you  are older than 65 years, and you have had normal Pap tests for the past 10 years, you no longer need to have Pap tests.  If you have had past treatment for cervical cancer or a condition that could lead to cancer, you need Pap tests and screening for cancer for at least 20 years after your treatment.  If you no longer get a Pap test, assess your risk factors if they change (such as having a new sexual partner). This can affect whether you should start being screened again.  Some women have medical problems that increase their chance of getting cervical cancer. If this is the case for you, your health care provider may recommend more frequent screening and Pap tests.  The human papillomavirus (HPV) test is another test that may be used for  cervical cancer screening. The HPV test looks for the virus that can cause cell changes in the cervix. The cells collected during the Pap test can be tested for HPV.  The HPV test can be used to screen women 5 years of age and older. Getting tested for HPV can extend the interval between normal Pap tests from three to five years.  An HPV test also should be used to screen women of any age who have unclear Pap test results.  After 70 years of age, women should have HPV testing as often as Pap tests.  Colorectal Cancer  This type of cancer can be detected and often prevented.  Routine colorectal cancer screening usually begins at 70 years of age and continues through 70 years of age.  Your health care provider may recommend screening at an earlier age if you have risk factors for colon cancer.  Your health care provider may also recommend using home test kits to check for hidden blood in the stool.  A small camera at the end of a tube can be used to examine your colon directly (sigmoidoscopy or colonoscopy). This is done to check for the earliest forms of colorectal cancer.  Routine screening usually begins at age 65.  Direct examination of the colon  should be repeated every 5-10 years through 70 years of age. However, you may need to be screened more often if early forms of precancerous polyps or small growths are found. Skin Cancer  Check your skin from head to toe regularly.  Tell your health care provider about any new moles or changes in moles, especially if there is a change in a mole's shape or color.  Also tell your health care provider if you have a mole that is larger than the size of a pencil eraser.  Always use sunscreen. Apply sunscreen liberally and repeatedly throughout the day.  Protect yourself by wearing long sleeves, pants, a wide-brimmed hat, and sunglasses whenever you are outside. HEART DISEASE, DIABETES, AND HIGH BLOOD PRESSURE   Have your blood pressure checked at least every 1-2 years. High blood pressure causes heart disease and increases the risk of stroke.  If you are between 69 years and 47 years old, ask your health care provider if you should take aspirin to prevent strokes.  Have regular diabetes screenings. This involves taking a blood sample to check your fasting blood sugar level.  If you are at a normal weight and have a low risk for diabetes, have this test once every three years after 70 years of age.  If you are overweight and have a high risk for diabetes, consider being tested at a younger age or more often. PREVENTING INFECTION  Hepatitis B  If you have a higher risk for hepatitis B, you should be screened for this virus. You are considered at high risk for hepatitis B if:  You were born in a country where hepatitis B is common. Ask your health care provider which countries are considered high risk.  Your parents were born in a high-risk country, and you have not been immunized against hepatitis B (hepatitis B vaccine).  You have HIV or AIDS.  You use needles to inject street drugs.  You live with someone who has hepatitis B.  You have had sex with someone who has hepatitis B.  You  get hemodialysis treatment.  You take certain medicines for conditions, including cancer, organ transplantation, and autoimmune conditions. Hepatitis C  Blood testing is recommended for:  Everyone born from [birth year] through 1966.  Anyone with known risk factors for hepatitis C. Sexually transmitted infections (STIs)  You should be screened for sexually transmitted infections (STIs) including gonorrhea and chlamydia if:  You are sexually active and are younger than 70 years of age.  You are older than 70 years of age and your health care provider tells you that you are at risk for this type of infection.  Your sexual activity has changed since you were last screened and you are at an increased risk for chlamydia or gonorrhea. Ask your health care provider if you are at risk.  If you do not have HIV, but are at risk, it may be recommended that you take a prescription medicine daily to prevent HIV infection. This is called pre-exposure prophylaxis (PrEP). You are considered at risk if:  You are sexually active and do not regularly use condoms or know the HIV status of your partner(s).  You take drugs by injection.  You are sexually active with a partner who has HIV. Talk with your health care provider about whether you are at high risk of being infected with HIV. If you choose to begin PrEP, you should first be tested for HIV. You should then be tested every 3 months for as long as you are taking PrEP.  PREGNANCY   If you are premenopausal and you may become pregnant, ask your health care provider about preconception counseling.  If you may become pregnant, take 400 to 800 micrograms (mcg) of folic acid every day.  If you want to prevent pregnancy, talk to your health care provider about birth control (contraception). OSTEOPOROSIS AND MENOPAUSE   Osteoporosis is a disease in which the bones lose minerals and strength with aging. This can result in serious bone fractures. Your risk for  osteoporosis can be identified using a bone density scan.  If you are 2 years of age or older, or if you are at risk for osteoporosis and fractures, ask your health care provider if you should be screened.  Ask your health care provider whether you should take a calcium or vitamin D supplement to lower your risk for osteoporosis.  Menopause may have certain physical symptoms and risks.  Hormone replacement therapy may reduce some of these symptoms and risks. Talk to your health care provider about whether hormone replacement therapy is right for you.  HOME CARE INSTRUCTIONS   Schedule regular health, dental, and eye exams.  Stay current with your immunizations.   Do not use any tobacco products including cigarettes, chewing tobacco, or electronic cigarettes.  If you are pregnant, do not drink alcohol.  If you are breastfeeding, limit how much and how often you drink alcohol.  Limit alcohol intake to no more than 1 drink per day for nonpregnant women. One drink equals 12 ounces of beer, 5 ounces of wine, or 1 ounces of hard liquor.  Do not use street drugs.  Do not share needles.  Ask your health care provider for help if you need support or information about quitting drugs.  Tell your health care provider if you often feel depressed.  Tell your health care provider if you have ever been abused or do not feel safe at home. Document Released: 04/18/2011 Document Revised: 02/17/2014 Document Reviewed: 09/04/2013 [facility] Patient Information 2015 [place], [place]. This information is not intended to replace advice given to you by your health care provider. Make sure you discuss any questions you have with your health care provider.  Fall Prevention and Home Safety Falls cause injuries and can affect all age groups. It is possible to prevent falls.  HOW TO PREVENT FALLS  Wear shoes with rubber soles that do not have an opening for your toes.  Keep the inside and outside of  your house well lit.  Use night lights throughout your home.  Remove clutter from floors.  Clean up floor spills.  Remove throw rugs or fasten them to the floor with carpet tape.  Do not place electrical cords across pathways.  Put grab bars by your tub, shower, and toilet. Do not use towel bars as grab bars.  Put handrails on both sides of the stairway. Fix loose handrails.  Do not climb on stools or stepladders, if possible.  Do not wax your floors.  Repair uneven or unsafe sidewalks, walkways, or stairs.  Keep items you use a lot within reach.  Be aware of pets.  Keep emergency numbers next to the telephone.  Put smoke detectors in your home and near bedrooms. Ask your doctor what other things you can do to prevent falls. Document Released: 07/30/2009 Document Revised: 04/03/2012 Document Reviewed: 01/03/2012 [facility] Patient Information 2015 [place], [place]. This information is not intended to replace advice given to you by your health care provider. Make sure you discuss any questions you have with your health care provider.  Health Maintenance Adopting a healthy lifestyle and getting preventive care can go a long way to promote health and wellness. Talk with your health care provider about what schedule of regular examinations is right for you. This is a good chance for you to check in with your provider about disease prevention and staying healthy. In between checkups, there are plenty of things you can do on your own. Experts have done a lot of research about which lifestyle changes and preventive measures are most likely to keep you healthy. Ask your health care provider for more information. WEIGHT AND DIET  Eat a healthy diet  Be sure to include plenty of vegetables, fruits, low-fat dairy products, and lean protein.  Do not eat a lot of foods high in solid fats, added sugars, or salt.  Get regular exercise. This is one of the most important things you can do for  your health.  Most adults should exercise for at least 150 minutes each week. The exercise should increase your heart rate and make you sweat (moderate-intensity exercise).  Most adults should also do strengthening exercises at least twice a week. This is in addition to the moderate-intensity exercise.  Maintain a healthy weight  Body mass index (BMI) is a measurement that can be used to identify possible weight problems. It estimates body fat based on height and weight. Your health care provider can help determine your BMI and help you achieve or maintain a healthy weight.  For females 70 years of age and older:   A BMI below 18.5 is considered underweight.  A BMI of 18.5 to 24.9 is normal.  A BMI of 25 to 29.9 is considered overweight.  A BMI of 30 and above is considered obese.  Watch levels of cholesterol and blood lipids  You should start having your blood tested for lipids and cholesterol at 70 years of age, then have this test every 5 years.  You may need to have your cholesterol levels checked more often if:  Your lipid or cholesterol levels are high.  You are older than 70 years of age.  You are at high risk  for heart disease.  CANCER SCREENING   Lung Cancer  Lung cancer screening is recommended for adults 67-22 years old who are at high risk for lung cancer because of a history of smoking.  A yearly low-dose CT scan of the lungs is recommended for people who:  Currently smoke.  Have quit within the past 15 years.  Have at least a 30-pack-year history of smoking. A pack year is smoking an average of one pack of cigarettes a day for 1 year.  Yearly screening should continue until it has been 15 years since you quit.  Yearly screening should stop if you develop a health problem that would prevent you from having lung cancer treatment.  Breast Cancer  Practice breast self-awareness. This means understanding how your breasts normally appear and feel.  It  also means doing regular breast self-exams. Let your health care provider know about any changes, no matter how small.  If you are in your 20s or 30s, you should have a clinical breast exam (CBE) by a health care provider every 1-3 years as part of a regular health exam.  If you are 46 or older, have a CBE every year. Also consider having a breast X-ray (mammogram) every year.  If you have a family history of breast cancer, talk to your health care provider about genetic screening.  If you are at high risk for breast cancer, talk to your health care provider about having an MRI and a mammogram every year.  Breast cancer gene (BRCA) assessment is recommended for women who have family members with BRCA-related cancers. BRCA-related cancers include:  Breast.  Ovarian.  Tubal.  Peritoneal cancers.  Results of the assessment will determine the need for genetic counseling and BRCA1 and BRCA2 testing. Cervical Cancer Routine pelvic examinations to screen for cervical cancer are no longer recommended for nonpregnant women who are considered low risk for cancer of the pelvic organs (ovaries, uterus, and vagina) and who do not have symptoms. A pelvic examination may be necessary if you have symptoms including those associated with pelvic infections. Ask your health care provider if a screening pelvic exam is right for you.   The Pap test is the screening test for cervical cancer for women who are considered at risk.  If you had a hysterectomy for a problem that was not cancer or a condition that could lead to cancer, then you no longer need Pap tests.  If you are older than 65 years, and you have had normal Pap tests for the past 10 years, you no longer need to have Pap tests.  If you have had past treatment for cervical cancer or a condition that could lead to cancer, you need Pap tests and screening for cancer for at least 20 years after your treatment.  If you no longer get a Pap test,  assess your risk factors if they change (such as having a new sexual partner). This can affect whether you should start being screened again.  Some women have medical problems that increase their chance of getting cervical cancer. If this is the case for you, your health care provider may recommend more frequent screening and Pap tests.  The human papillomavirus (HPV) test is another test that may be used for cervical cancer screening. The HPV test looks for the virus that can cause cell changes in the cervix. The cells collected during the Pap test can be tested for HPV.  The HPV test can be used to screen women  56 years of age and older. Getting tested for HPV can extend the interval between normal Pap tests from three to five years.  An HPV test also should be used to screen women of any age who have unclear Pap test results.  After 70 years of age, women should have HPV testing as often as Pap tests.  Colorectal Cancer  This type of cancer can be detected and often prevented.  Routine colorectal cancer screening usually begins at 70 years of age and continues through 70 years of age.  Your health care provider may recommend screening at an earlier age if you have risk factors for colon cancer.  Your health care provider may also recommend using home test kits to check for hidden blood in the stool.  A small camera at the end of a tube can be used to examine your colon directly (sigmoidoscopy or colonoscopy). This is done to check for the earliest forms of colorectal cancer.  Routine screening usually begins at age 37.  Direct examination of the colon should be repeated every 5-10 years through 70 years of age. However, you may need to be screened more often if early forms of precancerous polyps or small growths are found. Skin Cancer  Check your skin from head to toe regularly.  Tell your health care provider about any new moles or changes in moles, especially if there is a change  in a mole's shape or color.  Also tell your health care provider if you have a mole that is larger than the size of a pencil eraser.  Always use sunscreen. Apply sunscreen liberally and repeatedly throughout the day.  Protect yourself by wearing long sleeves, pants, a wide-brimmed hat, and sunglasses whenever you are outside. HEART DISEASE, DIABETES, AND HIGH BLOOD PRESSURE   Have your blood pressure checked at least every 1-2 years. High blood pressure causes heart disease and increases the risk of stroke.  If you are between 22 years and 34 years old, ask your health care provider if you should take aspirin to prevent strokes.  Have regular diabetes screenings. This involves taking a blood sample to check your fasting blood sugar level.  If you are at a normal weight and have a low risk for diabetes, have this test once every three years after 70 years of age.  If you are overweight and have a high risk for diabetes, consider being tested at a younger age or more often. PREVENTING INFECTION  Hepatitis B  If you have a higher risk for hepatitis B, you should be screened for this virus. You are considered at high risk for hepatitis B if:  You were born in a country where hepatitis B is common. Ask your health care provider which countries are considered high risk.  Your parents were born in a high-risk country, and you have not been immunized against hepatitis B (hepatitis B vaccine).  You have HIV or AIDS.  You use needles to inject street drugs.  You live with someone who has hepatitis B.  You have had sex with someone who has hepatitis B.  You get hemodialysis treatment.  You take certain medicines for conditions, including cancer, organ transplantation, and autoimmune conditions. Hepatitis C  Blood testing is recommended for:  Everyone born from [birth year] through 1966.  Anyone with known risk factors for hepatitis C. Sexually transmitted infections (STIs)  You should be  screened for sexually transmitted infections (STIs) including gonorrhea and chlamydia if:  You are sexually active and  are younger than 70 years of age.  You are older than 70 years of age and your health care provider tells you that you are at risk for this type of infection.  Your sexual activity has changed since you were last screened and you are at an increased risk for chlamydia or gonorrhea. Ask your health care provider if you are at risk.  If you do not have HIV, but are at risk, it may be recommended that you take a prescription medicine daily to prevent HIV infection. This is called pre-exposure prophylaxis (PrEP). You are considered at risk if:  You are sexually active and do not regularly use condoms or know the HIV status of your partner(s).  You take drugs by injection.  You are sexually active with a partner who has HIV. Talk with your health care provider about whether you are at high risk of being infected with HIV. If you choose to begin PrEP, you should first be tested for HIV. You should then be tested every 3 months for as long as you are taking PrEP.  PREGNANCY   If you are premenopausal and you may become pregnant, ask your health care provider about preconception counseling.  If you may become pregnant, take 400 to 800 micrograms (mcg) of folic acid every day.  If you want to prevent pregnancy, talk to your health care provider about birth control (contraception). OSTEOPOROSIS AND MENOPAUSE   Osteoporosis is a disease in which the bones lose minerals and strength with aging. This can result in serious bone fractures. Your risk for osteoporosis can be identified using a bone density scan.  If you are 78 years of age or older, or if you are at risk for osteoporosis and fractures, ask your health care provider if you should be screened.  Ask your health care provider whether you should take a calcium or vitamin D supplement to lower your risk for  osteoporosis.  Menopause may have certain physical symptoms and risks.  Hormone replacement therapy may reduce some of these symptoms and risks. Talk to your health care provider about whether hormone replacement therapy is right for you.  HOME CARE INSTRUCTIONS   Schedule regular health, dental, and eye exams.  Stay current with your immunizations.   Do not use any tobacco products including cigarettes, chewing tobacco, or electronic cigarettes.  If you are pregnant, do not drink alcohol.  If you are breastfeeding, limit how much and how often you drink alcohol.  Limit alcohol intake to no more than 1 drink per day for nonpregnant women. One drink equals 12 ounces of beer, 5 ounces of wine, or 1 ounces of hard liquor.  Do not use street drugs.  Do not share needles.  Ask your health care provider for help if you need support or information about quitting drugs.  Tell your health care provider if you often feel depressed.  Tell your health care provider if you have ever been abused or do not feel safe at home. Document Released: 04/18/2011 Document Revised: 02/17/2014 Document Reviewed: 09/04/2013 [facility] Patient Information 2015 [place], [place]. This information is not intended to replace advice given to you by your health care provider. Make sure you discuss any questions you have with your health care provider.

## 2015-06-26 ENCOUNTER — Ambulatory Visit: Payer: Medicare Other

## 2015-07-01 ENCOUNTER — Telehealth: Payer: Self-pay | Admitting: Internal Medicine

## 2015-07-01 NOTE — Telephone Encounter (Signed)
Patient is calling to follow up on the cardiac event monitor that was ordered on your 06/19/2015 visit. I am unsure how to track this, and Dahlia wasn't sure of the standing of it. Please follow up with patient to advise her of the status

## 2015-07-03 NOTE — Telephone Encounter (Signed)
Patient called to follow up on below request. Advised that you were out of office

## 2015-07-06 ENCOUNTER — Telehealth: Payer: Self-pay

## 2015-07-06 NOTE — Telephone Encounter (Signed)
Rec'd basket email that no one has called this patient regarding cardiac monitor. Call to home and lVM with fup number [phone]   Per [place] C; the referral was placed and [place] will fup

## 2015-07-07 ENCOUNTER — Ambulatory Visit (INDEPENDENT_AMBULATORY_CARE_PROVIDER_SITE_OTHER): Payer: Medicare Other

## 2015-07-07 DIAGNOSIS — R002 Palpitations: Secondary | ICD-10-CM | POA: Diagnosis not present

## 2015-07-08 NOTE — Telephone Encounter (Signed)
Call to [name] and stated she now has the halter monitor on and she is still having some episodes;  Stated that referral was fup by [place] in our office and appreciate her letting us know that she had not rec'd a call.  Reiterated to call any time for questions; Cardiology told her they will monitor and fup with Dr. Jenny Reichmann.

## 2015-07-09 ENCOUNTER — Other Ambulatory Visit: Payer: Self-pay | Admitting: Internal Medicine

## 2015-07-09 ENCOUNTER — Ambulatory Visit (INDEPENDENT_AMBULATORY_CARE_PROVIDER_SITE_OTHER): Payer: Medicare Other

## 2015-07-09 DIAGNOSIS — Z23 Encounter for immunization: Secondary | ICD-10-CM | POA: Diagnosis not present

## 2015-07-16 ENCOUNTER — Ambulatory Visit: Payer: Medicare Other

## 2015-07-18 ENCOUNTER — Other Ambulatory Visit: Payer: Self-pay | Admitting: Internal Medicine

## 2015-07-27 ENCOUNTER — Other Ambulatory Visit: Payer: Self-pay | Admitting: Internal Medicine

## 2015-07-30 ENCOUNTER — Encounter: Payer: Self-pay | Admitting: Internal Medicine

## 2015-07-30 ENCOUNTER — Ambulatory Visit (INDEPENDENT_AMBULATORY_CARE_PROVIDER_SITE_OTHER): Payer: Medicare Other | Admitting: Internal Medicine

## 2015-07-30 ENCOUNTER — Other Ambulatory Visit: Payer: Medicare Other

## 2015-07-30 VITALS — BP 160/88 | HR 68 | Temp 98.6°F | Resp 16 | Ht 65.0 in | Wt 164.0 lb

## 2015-07-30 DIAGNOSIS — N390 Urinary tract infection, site not specified: Secondary | ICD-10-CM | POA: Diagnosis not present

## 2015-07-30 LAB — POCT URINALYSIS DIPSTICK
BILIRUBIN UA: NEGATIVE
GLUCOSE UA: NEGATIVE
Ketones, UA: NEGATIVE
NITRITE UA: NEGATIVE
Protein, UA: NEGATIVE
RBC UA: NEGATIVE
SPEC GRAV UA: 1.015
Urobilinogen, UA: 0.2
pH, UA: 6.5

## 2015-07-30 MED ORDER — NITROFURANTOIN MONOHYD MACRO 100 MG PO CAPS: 100.0000 mg | ORAL_CAPSULE | Freq: Two times a day (BID) | ORAL | Status: AC

## 2015-07-30 NOTE — Patient Instructions (Signed)

## 2015-07-30 NOTE — Progress Notes (Signed)
Pre visit review using our clinic review tool, if applicable. No additional management support is needed unless otherwise documented below in the visit note. 

## 2015-07-30 NOTE — Progress Notes (Signed)
Subjective:  Patient ID: [name], female    DOB: 30-Sep-1946  Age: 69 y.o. MRN: [id number]  CC: Urinary Tract Infection  New to me  HPI [name] presents for a 3 day history of urinary urgency, frequency, bladder pain, nausea, and chills.  Outpatient Prescriptions Prior to Visit  Medication Sig Dispense Refill  . aspirin 81 MG tablet Take 81 mg by mouth 2 (two) times daily.    . Cholecalciferol (VITAMIN D3) 5000 UNITS CAPS Take 5,000 Units by mouth daily.     . citalopram (CELEXA) 10 MG tablet TAKE 1 TABLET (10 MG TOTAL) BY MOUTH DAILY. 90 tablet 2  . clonazePAM (KLONOPIN) 1 MG tablet Take 1 mg by mouth at bedtime. As needed sleep    . cyanocobalamin 100 MCG tablet Take 100 mcg by mouth daily.    . diclofenac sodium (VOLTAREN) 1 % GEL Apply 2 g topically 4 (four) times daily. 381 g 2  . folic acid (FOLVITE) 1 MG tablet Take 2 mg by mouth daily.     . hydrochlorothiazide (MICROZIDE) 12.5 MG capsule TAKE ONE CAPSULE BY MOUTH EVERY DAY 30 capsule 5  . hydroxychloroquine (PLAQUENIL) 200 MG tablet   3  . methotrexate (RHEUMATREX) 2.5 MG tablet Take 10 mg by mouth once a week.     . metoprolol (LOPRESSOR) 50 MG tablet Take 1 tablet (50 mg total) by mouth 2 (two) times daily. 180 tablet 3  . Multiple Vitamins-Minerals (CENTRUM SILVER PO) Take by mouth daily.    . pravastatin (PRAVACHOL) 40 MG tablet TAKE 1 TABLET BY MOUTH EVERY DAY 90 tablet 2  . topiramate (TOPAMAX) 50 MG tablet TAKE 1 TABLET (50 MG TOTAL) BY MOUTH 2 (TWO) TIMES DAILY. 180 tablet 1  . pravastatin (PRAVACHOL) 40 MG tablet TAKE 1 TABLET (40 MG TOTAL) BY MOUTH DAILY. 90 tablet 3   No facility-administered medications prior to visit.    ROS Review of Systems  Constitutional: Positive for chills. Negative for fever, diaphoresis, appetite change and fatigue.  HENT: Negative.   Eyes: Negative.   Respiratory: Negative.  Negative for cough, choking, chest tightness, shortness of breath and stridor.     Cardiovascular: Negative.  Negative for chest pain, palpitations and leg swelling.  Gastrointestinal: Positive for nausea and abdominal pain. Negative for vomiting, diarrhea, constipation and abdominal distention.  Endocrine: Negative.   Genitourinary: Positive for dysuria and frequency. Negative for urgency, hematuria, flank pain, decreased urine volume, difficulty urinating, pelvic pain and dyspareunia.  Musculoskeletal: Negative.  Negative for myalgias, back pain, joint swelling, arthralgias and neck stiffness.  Skin: Negative.  Negative for rash.  Allergic/Immunologic: Negative.   Neurological: Negative.   Hematological: Negative.  Negative for adenopathy. Does not bruise/bleed easily.  Psychiatric/Behavioral: Negative.     Objective:  BP 160/88 mmHg  Pulse 68  Temp(Src) 98.6 F (37 C) (Oral)  Resp 16  Ht 5\' 5"  (1.651 m)  Wt 164 lb (74.39 kg)  BMI 27.29 kg/m2  SpO2 97%  BP Readings from Last 3 Encounters:  07/30/15 160/88  06/19/15 150/80  04/22/15 132/76    Wt Readings from Last 3 Encounters:  07/30/15 164 lb (74.39 kg)  06/19/15 162 lb (73.483 kg)  04/22/15 161 lb (73.029 kg)    Physical Exam  Constitutional: She is oriented to person, place, and time. She appears well-developed and well-nourished.  Non-toxic appearance. She does not have a sickly appearance. She does not appear ill. No distress.  HENT:  Head: Normocephalic and atraumatic.  Mouth/Throat: Oropharynx is clear and moist. No oropharyngeal exudate.  Eyes: Conjunctivae are normal. Right eye exhibits no discharge. Left eye exhibits no discharge. No scleral icterus.  Neck: Normal range of motion. Neck supple. No JVD present. No tracheal deviation present. No thyromegaly present.  Cardiovascular: Normal rate, regular rhythm, normal heart sounds and intact distal pulses.  Exam reveals no gallop and no friction rub.   No murmur heard. Pulmonary/Chest: Effort normal and breath sounds normal. No stridor. No  respiratory distress. She has no wheezes. She has no rales. She exhibits no tenderness.  Abdominal: Soft. Normal appearance and bowel sounds are normal. She exhibits no distension and no mass. There is no hepatosplenomegaly, splenomegaly or hepatomegaly. There is tenderness in the suprapubic area. There is no rebound, no guarding and no CVA tenderness.  Musculoskeletal: Normal range of motion. She exhibits no edema or tenderness.  Lymphadenopathy:    She has no cervical adenopathy.  Neurological: She is oriented to person, place, and time.  Skin: Skin is warm and dry. No rash noted. She is not diaphoretic. No erythema. No pallor.  Vitals reviewed.   Lab Results  Component Value Date   WBC 8.7 03/31/2015   HGB 12.2 03/31/2015   HCT 35.6* 03/31/2015   PLT 321.0 03/31/2015   GLUCOSE 102* 03/31/2015   CHOL 116 03/31/2015   TRIG 124.0 03/31/2015   HDL 37.30* 03/31/2015   LDLCALC 54 03/31/2015   ALT 17 03/31/2015   AST 21 03/31/2015   NA 131* 03/31/2015   K 3.7 03/31/2015   CL 97 03/31/2015   CREATININE 0.98 03/31/2015   BUN 12 03/31/2015   CO2 28 03/31/2015   TSH 3.73 03/31/2015    Dg Lumbar Spine Complete  03/31/2015  CLINICAL DATA:  Low back pain without sciatica for 3 weeks. No known injury. EXAM: LUMBAR SPINE - COMPLETE 4+ VIEW COMPARISON:  None. FINDINGS: There is no evidence of lumbar spine fracture. Alignment is normal. Intervertebral disc spaces are maintained. Mild to moderate degenerative disc disease is seen at levels of L1-2 and L5-S1. Mild facet DJD also seen bilaterally at L5-S1. No focal lytic or sclerotic bone lesions identified . IMPRESSION: No acute findings. Degenerative spondylosis, as described above. Electronically Signed   By: Earle Gell M.D.   On: 03/31/2015 15:56    Assessment & Plan:   [name] was seen today for urinary tract infection.  Diagnoses and all orders for this visit:  Recurrent UTI- she has signs and symptoms of acute cystitis and her urine  culture is positive for gram-negative rods. She has multiple drug allergies so my treatment options are limited. Will treat with a 5 day course of nitrofurantoin. -     CULTURE, URINE COMPREHENSIVE; Future -     POCT urinalysis dipstick -     Urine Culture; Future   I am having [name] maintain her aspirin, Vitamin D3, cyanocobalamin, Multiple Vitamins-Minerals (CENTRUM SILVER PO), diclofenac sodium, methotrexate, folic acid, clonazePAM, citalopram, hydroxychloroquine, metoprolol, topiramate, pravastatin, and hydrochlorothiazide.  No orders of the defined types were placed in this encounter.     Follow-up: No Follow-up on file.  Scarlette Calico, MD

## 2015-07-31 ENCOUNTER — Ambulatory Visit: Payer: Medicare Other

## 2015-08-02 LAB — CULTURE, URINE COMPREHENSIVE: Colony Count: 50000

## 2015-08-14 ENCOUNTER — Telehealth: Payer: Self-pay | Admitting: Internal Medicine

## 2015-08-14 NOTE — Telephone Encounter (Signed)
Pt called in and would like a nurse to give her a call about the results of the heart monitor that she was wearing.

## 2015-08-14 NOTE — Telephone Encounter (Signed)
The sept 20 cardiac event monitor showed normal heart rhythm only, thanks

## 2015-08-14 NOTE — Telephone Encounter (Signed)
Pt advised.

## 2015-08-21 ENCOUNTER — Other Ambulatory Visit: Payer: Self-pay | Admitting: Obstetrics and Gynecology

## 2015-08-21 ENCOUNTER — Telehealth: Payer: Self-pay | Admitting: Interventional Cardiology

## 2015-08-21 DIAGNOSIS — R928 Other abnormal and inconclusive findings on diagnostic imaging of breast: Secondary | ICD-10-CM

## 2015-08-21 NOTE — Telephone Encounter (Signed)
I spoke with the pt and she called the office to see if skipped beats are normal. The pt just wore a heart monitor and per interpretation from Dr Jenny Reichmann this showed NSR. The pt was last seen in 2014 by Dr Tamala Julian and per documentation she has Tachybradycardia syndrome with prior history of atrial fibrillation (remote).  I spoke with the pt about caffeine consumption and she said that she does not drink caffeine. The pt said she has palpitations on a daily basis and she did have them while wearing her heart monitor. I advised the pt that I cannot tell her that everything is normal. I offered the pt an appointment with Dr Tamala Julian on 08/26/15 but she said she has been dealing with this for a while and would like to wait and contact our office after Christmas for an appointment. I instructed the pt to call the office with any other questions or concerns.

## 2015-08-21 NOTE — Telephone Encounter (Signed)
New message     Pt states her heart skips beats every now and then.  Is this normal?  She wore a monitor about 1 month ago.

## 2015-09-01 ENCOUNTER — Other Ambulatory Visit: Payer: Self-pay | Admitting: Obstetrics and Gynecology

## 2015-09-01 ENCOUNTER — Ambulatory Visit
Admission: RE | Admit: 2015-09-01 | Discharge: 2015-09-01 | Disposition: A | Discharge status: Home / Self Care | Payer: Medicare Other | Source: Ambulatory Visit | Attending: Obstetrics and Gynecology | Admitting: Obstetrics and Gynecology

## 2015-09-01 DIAGNOSIS — R928 Other abnormal and inconclusive findings on diagnostic imaging of breast: Secondary | ICD-10-CM

## 2015-09-02 ENCOUNTER — Ambulatory Visit
Admission: RE | Admit: 2015-09-02 | Discharge: 2015-09-02 | Disposition: A | Discharge status: Home / Self Care | Payer: Medicare Other | Source: Ambulatory Visit | Attending: Obstetrics and Gynecology | Admitting: Obstetrics and Gynecology

## 2015-09-02 DIAGNOSIS — R928 Other abnormal and inconclusive findings on diagnostic imaging of breast: Secondary | ICD-10-CM

## 2015-09-16 ENCOUNTER — Encounter: Payer: Self-pay | Admitting: Internal Medicine

## 2015-09-16 ENCOUNTER — Ambulatory Visit (INDEPENDENT_AMBULATORY_CARE_PROVIDER_SITE_OTHER): Payer: Medicare Other | Admitting: Internal Medicine

## 2015-09-16 VITALS — BP 120/80 | HR 74 | Temp 98.5°F | Ht 65.0 in | Wt 163.0 lb

## 2015-09-16 DIAGNOSIS — N390 Urinary tract infection, site not specified: Secondary | ICD-10-CM | POA: Diagnosis not present

## 2015-09-16 DIAGNOSIS — I10 Essential (primary) hypertension: Secondary | ICD-10-CM

## 2015-09-16 DIAGNOSIS — M545 Low back pain, unspecified: Secondary | ICD-10-CM

## 2015-09-16 DIAGNOSIS — R300 Dysuria: Secondary | ICD-10-CM

## 2015-09-16 DIAGNOSIS — M069 Rheumatoid arthritis, unspecified: Secondary | ICD-10-CM

## 2015-09-16 HISTORY — DX: Rheumatoid arthritis, unspecified: M06.9

## 2015-09-16 LAB — POCT URINALYSIS DIPSTICK
Bilirubin, UA: NEGATIVE
Blood, UA: NEGATIVE
Glucose, UA: NEGATIVE
KETONES UA: NEGATIVE
LEUKOCYTES UA: NEGATIVE
NITRITE UA: NEGATIVE
PH UA: 6
PROTEIN UA: NEGATIVE
Spec Grav, UA: 1.015
UROBILINOGEN UA: 0.2

## 2015-09-16 MED ORDER — TIZANIDINE HCL 4 MG PO TABS: 4.0000 mg | ORAL_TABLET | Freq: Four times a day (QID) | ORAL | Status: DC | PRN

## 2015-09-16 MED ORDER — TRAMADOL HCL 50 MG PO TABS: 50.0000 mg | ORAL_TABLET | Freq: Three times a day (TID) | ORAL | Status: DC | PRN

## 2015-09-16 NOTE — Assessment & Plan Note (Signed)
udip neg, exam benign, pt o/w reassured, no indication for antibx today

## 2015-09-16 NOTE — Assessment & Plan Note (Signed)
Ok to consider start humira from my perspective, to f/u with rheum as planned
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
## 2015-09-16 NOTE — Assessment & Plan Note (Signed)
stable overall by history and exam, recent data reviewed with pt, and pt to continue medical treatment as before,  to f/u any worsening symptoms or concerns BP Readings from Last 3 Encounters:  09/16/15 120/80  07/30/15 160/88  06/19/15 150/80

## 2015-09-16 NOTE — Progress Notes (Signed)
Pre visit review using our clinic review tool, if applicable. No additional management support is needed unless otherwise documented below in the visit note. 

## 2015-09-16 NOTE — Patient Instructions (Signed)
Please take all new medication as prescribed - the tramadol, and muscle relaxer as needed ? ?Please continue all other medications as before, and refills have been done if requested. ? ?Please have the pharmacy call with any other refills you may need. ? ?Please keep your appointments with your specialists as you may have planned ? ? ? ?

## 2015-09-16 NOTE — Assessment & Plan Note (Signed)
As per previous exam (which pt does not seem to recall today) exam c/w msk/possible underlying DJD /DDD pain flare, no neuro changes, will tx conservatively for now with tramadol and muscle relaxer trial, consider imaging if not improved,  to f/u any worsening symptoms or concerns

## 2015-10-01 ENCOUNTER — Ambulatory Visit: Payer: Medicare Other | Admitting: Internal Medicine

## 2015-10-06 ENCOUNTER — Ambulatory Visit (INDEPENDENT_AMBULATORY_CARE_PROVIDER_SITE_OTHER): Payer: Medicare Other | Admitting: Internal Medicine

## 2015-10-06 ENCOUNTER — Encounter: Payer: Self-pay | Admitting: Internal Medicine

## 2015-10-06 VITALS — BP 114/66 | HR 62 | Temp 97.7°F | Ht 65.0 in | Wt 161.0 lb

## 2015-10-06 DIAGNOSIS — R7302 Impaired glucose tolerance (oral): Secondary | ICD-10-CM

## 2015-10-06 DIAGNOSIS — I10 Essential (primary) hypertension: Secondary | ICD-10-CM

## 2015-10-06 DIAGNOSIS — E785 Hyperlipidemia, unspecified: Secondary | ICD-10-CM | POA: Diagnosis not present

## 2015-10-06 DIAGNOSIS — M069 Rheumatoid arthritis, unspecified: Secondary | ICD-10-CM

## 2015-10-06 DIAGNOSIS — Z0189 Encounter for other specified special examinations: Secondary | ICD-10-CM

## 2015-10-06 DIAGNOSIS — Z0000 Encounter for general adult medical examination without abnormal findings: Secondary | ICD-10-CM

## 2015-10-06 NOTE — Patient Instructions (Signed)
Please continue all other medications as before, and refills have been done if requested.  Please have the pharmacy call with any other refills you may need.  Please continue your efforts at being more active, low cholesterol diet, and weight control.  You are otherwise up to date with prevention measures today.  Please keep your appointments with your specialists as you may have planned  You will be contacted regarding the referral for: Rheumatology  Please return in 6 months, or sooner if needed, with Lab testing done 3-5 days before

## 2015-10-06 NOTE — Progress Notes (Signed)
Pre visit review using our clinic review tool, if applicable. No additional management support is needed unless otherwise documented below in the visit note. 
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
## 2015-10-09 NOTE — Assessment & Plan Note (Signed)
stable overall by history and exam, recent data reviewed with pt, and pt to continue medical treatment as before,  to f/u any worsening symptoms or concerns Lab Results  Component Value Date   LDLCALC 54 03/31/2015

## 2015-10-09 NOTE — Assessment & Plan Note (Signed)
Exam benign for flare today, cont current tx, for referral new rheumatology per pt request

## 2015-10-09 NOTE — Assessment & Plan Note (Signed)
stable overall by history and exam, recent data reviewed with pt, and pt to continue medical treatment as before,  to f/u any worsening symptoms or concerns BP Readings from Last 3 Encounters:  10/06/15 114/66  09/16/15 120/80  07/30/15 160/88

## 2015-10-09 NOTE — Assessment & Plan Note (Signed)
stable overall by history and exam, recent data reviewed with pt, and pt to continue medical treatment as before,  to f/u any worsening symptoms or concerns Lab Results  Component Value Date   WBC 8.7 03/31/2015   HGB 12.2 03/31/2015   HCT 35.6* 03/31/2015   PLT 321.0 03/31/2015   GLUCOSE 102* 03/31/2015   CHOL 116 03/31/2015   TRIG 124.0 03/31/2015   HDL 37.30* 03/31/2015   LDLCALC 54 03/31/2015   ALT 17 03/31/2015   AST 21 03/31/2015   NA 131* 03/31/2015   K 3.7 03/31/2015   CL 97 03/31/2015   CREATININE 0.98 03/31/2015   BUN 12 03/31/2015   CO2 28 03/31/2015   TSH 3.73 03/31/2015

## 2015-10-18 HISTORY — PX: BREAST EXCISIONAL BIOPSY: SUR124

## 2015-10-23 ENCOUNTER — Telehealth: Payer: Self-pay | Admitting: Internal Medicine

## 2015-10-23 NOTE — Telephone Encounter (Signed)
Pt called in and said that she wants to know by " every time she burps it taste like fish"???  She said that the last few weeks she has been burping and it taste like fish    Best number [phone]

## 2015-10-26 NOTE — Telephone Encounter (Signed)
I am not sure, but can try otc prilosec 20 mg daily to see if helps, thanks

## 2015-10-27 NOTE — Telephone Encounter (Signed)
Pt advised and understood. 

## 2015-10-27 NOTE — Telephone Encounter (Signed)
Returned pt call, no answer, no VM

## 2015-11-09 ENCOUNTER — Telehealth: Payer: Self-pay | Admitting: Internal Medicine

## 2015-11-09 NOTE — Telephone Encounter (Signed)
[facility]  Patient Name: [name]  DOB: Mar 18, 1946    Initial Comment Caller states takes has chest congestion and runny nose. Coughing a little. Wondering what she can take OTC. Has rheumatoid arthritis.   Nurse Assessment  Nurse: Wynetta Emery, RN, Baker Janus Date/Time Eilene Ghazi Time): 11/09/2015 4:05:20 PM  Confirm and document reason for call. If symptomatic, describe symptoms. You must click the next button to save text entered. ---[name] states her voice is hoarse and congested onset this am  Has the patient traveled out of the country within the last 30 days? ---No  Does the patient have any new or worsening symptoms? ---Yes  Will a triage be completed? ---Yes  Related visit to physician within the last 2 weeks? ---No  Does the PT have any chronic conditions? (i.e. diabetes, asthma, etc.) ---Unknown  Is this a behavioral health or substance abuse call? ---No     Guidelines    Guideline Title Affirmed Question Affirmed Notes  Common Cold Cold with no complications (all triage questions negative)    Final Disposition User   [place], RN, Baker Janus    Referrals  GO TO FACILITY UNDECIDED   Disagree/Comply: Comply

## 2015-11-09 NOTE — Telephone Encounter (Signed)
Pt wants OTC recommendation.

## 2015-11-10 NOTE — Telephone Encounter (Signed)
Pt informed of MD recommendation below.

## 2015-11-10 NOTE — Telephone Encounter (Signed)
OK for mucinex otc and/or delsym for cough, thanks

## 2015-12-16 ENCOUNTER — Other Ambulatory Visit: Payer: Self-pay | Admitting: Internal Medicine

## 2015-12-21 ENCOUNTER — Telehealth: Payer: Self-pay | Admitting: *Deleted

## 2015-12-21 NOTE — Telephone Encounter (Signed)
Received call pt states due to her insurance she will no longer get meds at [facility] need pharmcy change to [facility] on [address]. Inform pt updating pharmacy now...[name]

## 2016-01-04 ENCOUNTER — Other Ambulatory Visit: Payer: Self-pay | Admitting: Internal Medicine

## 2016-01-07 ENCOUNTER — Telehealth: Payer: Self-pay | Admitting: *Deleted

## 2016-01-07 MED ORDER — TOPIRAMATE 50 MG PO TABS: ORAL_TABLET | ORAL | Status: DC

## 2016-01-07 NOTE — Telephone Encounter (Signed)
Receive call pt states since her insurance change she has to get her meds from [facility] now. Would like rx sent on her Topamax to [facility] on [name]. Inform pt will send electronically...[name]

## 2016-01-11 ENCOUNTER — Telehealth: Payer: Self-pay | Admitting: Internal Medicine

## 2016-01-11 NOTE — Telephone Encounter (Signed)
Patient called in to ask that you give her a call. She states that her night tremors have continued and that the medication does not always work.

## 2016-01-11 NOTE — Telephone Encounter (Signed)
Please clarify  Does she mean that she feels the klonopin at bedtime is not effective for movements in the legs or other at night?

## 2016-01-12 NOTE — Telephone Encounter (Signed)
She can see neurology, but I suspect there would be no need for significant evaluation, and not very likely change in treatment since the symptoms are very rare

## 2016-01-12 NOTE — Telephone Encounter (Signed)
Please advise 

## 2016-01-12 NOTE — Telephone Encounter (Signed)
Pt called in, she is having night sweats and termor's.  This only happening once every 3 months.  But when she has them her head and stomach hurt for a few days after.  She wants to know if she needs to go back and see the neurologist

## 2016-01-15 ENCOUNTER — Ambulatory Visit (INDEPENDENT_AMBULATORY_CARE_PROVIDER_SITE_OTHER): Payer: Medicare Other | Admitting: Internal Medicine

## 2016-01-15 ENCOUNTER — Encounter: Payer: Self-pay | Admitting: Internal Medicine

## 2016-01-15 VITALS — BP 124/78 | HR 88 | Temp 99.8°F | Resp 20 | Wt 167.0 lb

## 2016-01-15 DIAGNOSIS — I10 Essential (primary) hypertension: Secondary | ICD-10-CM | POA: Diagnosis not present

## 2016-01-15 DIAGNOSIS — R7302 Impaired glucose tolerance (oral): Secondary | ICD-10-CM

## 2016-01-15 DIAGNOSIS — M069 Rheumatoid arthritis, unspecified: Secondary | ICD-10-CM

## 2016-01-15 DIAGNOSIS — J019 Acute sinusitis, unspecified: Secondary | ICD-10-CM | POA: Diagnosis not present

## 2016-01-15 MED ORDER — CEPHALEXIN 500 MG PO CAPS: 500.0000 mg | ORAL_CAPSULE | Freq: Four times a day (QID) | ORAL | Status: DC

## 2016-01-15 NOTE — Assessment & Plan Note (Signed)
Agree with pt to hold the humira for now until next wk as planned

## 2016-01-15 NOTE — Assessment & Plan Note (Signed)
stable overall by history and exam, recent data reviewed with pt, and pt to continue medical treatment as before,  to f/u any worsening symptoms or concerns No results found for: HGBA1C  

## 2016-01-15 NOTE — Progress Notes (Signed)
Pre visit review using our clinic review tool, if applicable. No additional management support is needed unless otherwise documented below in the visit note. 

## 2016-01-15 NOTE — Assessment & Plan Note (Signed)
stable overall by history and exam, recent data reviewed with pt, and pt to continue medical treatment as before,  to f/u any worsening symptoms or concerns BP Readings from Last 3 Encounters:  01/15/16 124/78  10/06/15 114/66  09/16/15 120/80
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
## 2016-01-15 NOTE — Patient Instructions (Signed)
Please take all new medication as prescribed - the antibiotic  You can also take Delsym OTC for cough, and/or Mucinex (or it's generic off brand) for congestion, and tylenol as needed for pain.  Please continue all other medications as before, including holding off on the Humira until next week  Please have the pharmacy call with any other refills you may need.  Please keep your appointments with your specialists as you may have planned

## 2016-01-25 ENCOUNTER — Other Ambulatory Visit: Payer: Self-pay | Admitting: Obstetrics and Gynecology

## 2016-01-25 DIAGNOSIS — N6320 Unspecified lump in the left breast, unspecified quadrant: Secondary | ICD-10-CM

## 2016-01-26 ENCOUNTER — Ambulatory Visit: Payer: Medicare Other | Admitting: Internal Medicine

## 2016-01-28 ENCOUNTER — Telehealth: Payer: Self-pay | Admitting: Internal Medicine

## 2016-01-28 MED ORDER — BENZONATATE 100 MG PO CAPS: ORAL_CAPSULE | ORAL | Status: DC

## 2016-01-28 NOTE — Telephone Encounter (Signed)
Patient states that she is still having sx of sinus infection and also has gotten a cough. She is asking if we want her to come back in or just call another round of medication and something for the cough.

## 2016-01-28 NOTE — Telephone Encounter (Signed)
Called and advised.

## 2016-01-28 NOTE — Telephone Encounter (Signed)
If no fever or worsening pain, the pt may be able to avoid further antibx, so I will call in pills for cough  And rememeber allergies this time of year can cause a lot of congestion that would not be infection related, so maybe also try such zyrtec otc, thanks

## 2016-01-31 ENCOUNTER — Other Ambulatory Visit: Payer: Self-pay | Admitting: Internal Medicine

## 2016-02-03 ENCOUNTER — Ambulatory Visit (INDEPENDENT_AMBULATORY_CARE_PROVIDER_SITE_OTHER)
Admission: RE | Admit: 2016-02-03 | Discharge: 2016-02-03 | Disposition: A | Discharge status: Home / Self Care | Payer: Medicare Other | Source: Ambulatory Visit | Attending: Internal Medicine | Admitting: Internal Medicine

## 2016-02-03 ENCOUNTER — Ambulatory Visit (INDEPENDENT_AMBULATORY_CARE_PROVIDER_SITE_OTHER): Payer: Medicare Other | Admitting: Internal Medicine

## 2016-02-03 ENCOUNTER — Encounter: Payer: Self-pay | Admitting: Internal Medicine

## 2016-02-03 VITALS — BP 130/80 | HR 79 | Temp 98.5°F | Resp 20 | Wt 163.0 lb

## 2016-02-03 DIAGNOSIS — I10 Essential (primary) hypertension: Secondary | ICD-10-CM | POA: Diagnosis not present

## 2016-02-03 DIAGNOSIS — R05 Cough: Secondary | ICD-10-CM | POA: Diagnosis not present

## 2016-02-03 DIAGNOSIS — R062 Wheezing: Secondary | ICD-10-CM

## 2016-02-03 DIAGNOSIS — R059 Cough, unspecified: Secondary | ICD-10-CM

## 2016-02-03 MED ORDER — ALBUTEROL SULFATE HFA 108 (90 BASE) MCG/ACT IN AERS: 2.0000 | INHALATION_SPRAY | Freq: Four times a day (QID) | RESPIRATORY_TRACT | Status: DC | PRN

## 2016-02-03 MED ORDER — AZITHROMYCIN 250 MG PO TABS: ORAL_TABLET | ORAL | Status: DC

## 2016-02-03 MED ORDER — PREDNISONE 10 MG PO TABS: ORAL_TABLET | ORAL | Status: DC

## 2016-02-03 MED ORDER — HYDROCODONE-HOMATROPINE 5-1.5 MG/5ML PO SYRP: 5.0000 mL | ORAL_SOLUTION | Freq: Four times a day (QID) | ORAL | Status: DC | PRN

## 2016-02-03 MED ORDER — METHYLPREDNISOLONE ACETATE 80 MG/ML IJ SUSP
80.0000 mg | Freq: Once | INTRAMUSCULAR | Status: AC
  Administered 2016-02-03: 80 mg via INTRAMUSCULAR

## 2016-02-03 NOTE — Patient Instructions (Signed)
You had the steroid shot today  Please take all new medication as prescribed - the antibiotic, cough medicine, prednisone, and Inhaler if needed  Please continue all other medications as before, and refills have been done if requested.  Please have the pharmacy call with any other refills you may need.  Please keep your appointments with your specialists as you may have planned  Please go to the XRAY Department in the Basement (go straight as you get off the elevator) for the x-ray testing  You will be contacted by phone if any changes need to be made immediately.  Otherwise, you will receive a letter about your results with an explanation, but please check with MyChart first.  Please remember to sign up for MyChart if you have not done so, as this will be important to you in the future with finding out test results, communicating by private email, and scheduling acute appointments online when needed.

## 2016-02-03 NOTE — Assessment & Plan Note (Signed)
stable overall by history and exam, recent data reviewed with pt, and pt to continue medical treatment as before,  to f/u any worsening symptoms or concerns BP Readings from Last 3 Encounters:  02/03/16 130/80  01/15/16 124/78  10/06/15 114/66

## 2016-02-03 NOTE — Assessment & Plan Note (Signed)
Mild to mod, for predpac asd, inhaler prn,,  to f/u any worsening symptoms or concerns 
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
## 2016-02-03 NOTE — Progress Notes (Signed)
Pre visit review using our clinic review tool, if applicable. No additional management support is needed unless otherwise documented below in the visit note. 

## 2016-02-03 NOTE — Assessment & Plan Note (Signed)
Mild to mod, c/w bronchitis vs pna, for cxr, also for antibx course,  Cough med prn, to f/u any worsening symptoms or concerns 

## 2016-02-04 ENCOUNTER — Encounter: Payer: Self-pay | Admitting: Internal Medicine

## 2016-03-01 ENCOUNTER — Other Ambulatory Visit: Payer: Self-pay | Admitting: Obstetrics and Gynecology

## 2016-03-01 ENCOUNTER — Ambulatory Visit
Admission: RE | Admit: 2016-03-01 | Discharge: 2016-03-01 | Disposition: A | Discharge status: Home / Self Care | Payer: Medicare Other | Source: Ambulatory Visit | Attending: Obstetrics and Gynecology | Admitting: Obstetrics and Gynecology

## 2016-03-01 DIAGNOSIS — N6320 Unspecified lump in the left breast, unspecified quadrant: Secondary | ICD-10-CM

## 2016-03-01 DIAGNOSIS — R921 Mammographic calcification found on diagnostic imaging of breast: Secondary | ICD-10-CM

## 2016-03-06 ENCOUNTER — Other Ambulatory Visit: Payer: Self-pay | Admitting: Internal Medicine

## 2016-03-09 ENCOUNTER — Ambulatory Visit (INDEPENDENT_AMBULATORY_CARE_PROVIDER_SITE_OTHER): Payer: Medicare Other | Admitting: Ophthalmology

## 2016-03-09 DIAGNOSIS — H43813 Vitreous degeneration, bilateral: Secondary | ICD-10-CM

## 2016-03-09 DIAGNOSIS — I10 Essential (primary) hypertension: Secondary | ICD-10-CM

## 2016-03-09 DIAGNOSIS — H353131 Nonexudative age-related macular degeneration, bilateral, early dry stage: Secondary | ICD-10-CM | POA: Diagnosis not present

## 2016-03-09 DIAGNOSIS — M069 Rheumatoid arthritis, unspecified: Secondary | ICD-10-CM | POA: Diagnosis not present

## 2016-03-09 DIAGNOSIS — H35033 Hypertensive retinopathy, bilateral: Secondary | ICD-10-CM

## 2016-03-10 ENCOUNTER — Other Ambulatory Visit: Payer: Self-pay | Admitting: Obstetrics and Gynecology

## 2016-03-10 ENCOUNTER — Ambulatory Visit
Admission: RE | Admit: 2016-03-10 | Discharge: 2016-03-10 | Disposition: A | Discharge status: Home / Self Care | Payer: Medicare Other | Source: Ambulatory Visit | Attending: Obstetrics and Gynecology | Admitting: Obstetrics and Gynecology

## 2016-03-10 DIAGNOSIS — R921 Mammographic calcification found on diagnostic imaging of breast: Secondary | ICD-10-CM

## 2016-03-17 DIAGNOSIS — N6019 Diffuse cystic mastopathy of unspecified breast: Secondary | ICD-10-CM

## 2016-03-17 HISTORY — DX: Diffuse cystic mastopathy of unspecified breast: N60.19

## 2016-03-21 ENCOUNTER — Other Ambulatory Visit: Payer: Self-pay | Admitting: General Surgery

## 2016-03-21 DIAGNOSIS — N6489 Other specified disorders of breast: Secondary | ICD-10-CM

## 2016-03-23 ENCOUNTER — Other Ambulatory Visit: Payer: Self-pay | Admitting: General Surgery

## 2016-03-23 DIAGNOSIS — N6489 Other specified disorders of breast: Secondary | ICD-10-CM

## 2016-03-26 ENCOUNTER — Telehealth: Payer: Self-pay

## 2016-03-26 NOTE — Telephone Encounter (Signed)
Patient is on the list for [facility] 2017 and may be a good candidate for an AWV in 2017. Please let me know if/when appt is scheduled.   -recent lumptectomy. May want to wait to call for AWV.

## 2016-03-29 ENCOUNTER — Encounter (HOSPITAL_BASED_OUTPATIENT_CLINIC_OR_DEPARTMENT_OTHER): Payer: Self-pay | Admitting: *Deleted

## 2016-03-30 ENCOUNTER — Encounter (HOSPITAL_BASED_OUTPATIENT_CLINIC_OR_DEPARTMENT_OTHER)
Admission: RE | Admit: 2016-03-30 | Discharge: 2016-03-30 | Disposition: A | Discharge status: Home / Self Care | Payer: Medicare Other | Source: Ambulatory Visit | Attending: General Surgery | Admitting: General Surgery

## 2016-03-30 DIAGNOSIS — Z01812 Encounter for preprocedural laboratory examination: Secondary | ICD-10-CM | POA: Insufficient documentation

## 2016-03-30 DIAGNOSIS — I10 Essential (primary) hypertension: Secondary | ICD-10-CM | POA: Diagnosis present

## 2016-03-30 LAB — BASIC METABOLIC PANEL
ANION GAP: 8 (ref 5–15)
BUN: 10 mg/dL (ref 6–20)
CHLORIDE: 105 mmol/L (ref 101–111)
CO2: 26 mmol/L (ref 22–32)
Calcium: 9.8 mg/dL (ref 8.9–10.3)
Creatinine, Ser: 0.96 mg/dL (ref 0.44–1.00)
GFR calc Af Amer: >60 mL/min (ref >60)
GFR, EST NON AFRICAN AMERICAN: 59 mL/min — AB (ref >60)
GLUCOSE: 120 mg/dL — AB (ref 65–99)
POTASSIUM: 3.8 mmol/L (ref 3.5–5.1)
Sodium: 139 mmol/L (ref 135–145)

## 2016-03-30 NOTE — Progress Notes (Signed)
Boost drink given and instructed pt to complete by 0915. Pt verbalized understanding.

## 2016-03-31 NOTE — Telephone Encounter (Signed)
Noted that [name] is scheduled for Breast surgery on 6/20; or next week; Stated this was a new dx and unexpected surgery. Wished her well and will make an outreach in a few months. AWV not due until Sept of 2017

## 2016-04-04 ENCOUNTER — Ambulatory Visit
Admission: RE | Admit: 2016-04-04 | Discharge: 2016-04-04 | Disposition: A | Discharge status: Home / Self Care | Payer: Medicare Other | Source: Ambulatory Visit | Attending: General Surgery | Admitting: General Surgery

## 2016-04-04 DIAGNOSIS — N6489 Other specified disorders of breast: Secondary | ICD-10-CM

## 2016-04-04 NOTE — H&P (Signed)
History of Present Illness Marland Kitchen T. Chirstine Defrain MD; 03/21/2016 4:16 PM) Patient words: Left breast CSL.  The patient is a 70 year old female who presents with a complaint of Breast problems. She is a patient of Dr. Claire Shown, referred by Dr. Dorise Bullion for a recent abnormal mammogram and core biopsy. The patient originally in November 2016 presented for routine screening mammogram and was found to have an abnormal small area of architectural distortion measuring 4 mm in the medial aspect of the left breast medial depth. Ultrasound was negative. Stereotactic core biopsy was recommended and was performed. Pathology dated September 02, 2015 showed fibrocystic change with usual ductal hyperplasia and microcalcifications. Short-term six-month follow-up was recommended. This was recently performed on Mar 01, 2016. This revealed an X-shaped biopsy marking clip in the area of previously biopsied distortion which was not well seen. However there appeared to be another area of distortion located approximately 2 cm inferior to the original marking clip. Stereotactic biopsy of this was recommended and was performed on 03/10/2016. Mammographic images post second biopsy showed the coil shaped clip having migrated somewhat laterally to the area of distortion. Final pathology from this biopsy revealed a complex sclerosing lesion. With this finding she was referred for consideration for excisional biopsy.  She reports no breast symptoms, specifically lump or pain or nipple discharge. Other than above no previous history of breast disease. No family history of breast cancer. No estrogen replacement therapy.   Other Problems Patsey Berthold, [place]; 03/21/2016 3:56 PM) Arthritis High blood pressure Hypercholesterolemia Pulmonary Embolism / Blood Clot in Legs  Past Surgical History Patsey Berthold, [place]; 03/21/2016 3:56 PM) Bypass Surgery for Poor Blood Flow to Legs  Diagnostic Studies History Patsey Berthold, [place]; 03/21/2016 3:56 PM) Colonoscopy 1-5 years ago Pap Smear 1-5 years ago  Allergies Patsey Berthold, [place]; 03/21/2016 3:58 PM) Alendronate Sodium  Atenolol  Ciprofloxacin  ClonazePAM  Codeine Sulfate  Cortisone  Doxycycline Hyclate   Medication History Patsey Berthold, CMA; 03/21/2016 4:00 PM) HydroCHLOROthiazide (12.5MG  Capsule, Oral) Active. Citalopram Hydrobromide (10MG  Tablet, Oral) Active. Hydroxychloroquine Sulfate (200MG  Tablet, Oral) Active. Methotrexate (2.5MG  Tablet, Oral) Active. Metoprolol Tartrate (50MG  Tablet, Oral) Active. Plaquenil (200MG  Tablet, Oral) Active. Pravastatin Sodium (40MG  Tablet, Oral) Active. Topiramate (50MG  Tablet, Oral) Active. Morrie Sheldon XR (11MG  Tablet ER 24HR, Oral) Active. Aspirin (81MG  Tablet DR, Oral) Active. Medications Reconciled  Social History Patsey Berthold, [place]; 03/21/2016 3:56 PM) No drug use Tobacco use Never smoker.  Family History Patsey Berthold, [place]; 03/21/2016 3:56 PM) Alcohol Abuse Father. Arthritis Father.  Pregnancy / Birth History Patsey Berthold, [place]; 03/21/2016 3:56 PM) Age at menarche 87 years. Age of menopause 68-55 Gravida 3 Maternal age 52-25 Para 3    Review of Systems Patsey Berthold CMA; 03/21/2016 3:56 PM) General Present- Night Sweats. Not Present- Appetite Loss, Chills, Fatigue, Fever, Weight Gain and Weight Loss. Skin Not Present- Change in Wart/Mole, Dryness, Hives, Jaundice, New Lesions, Non-Healing Wounds, Rash and Ulcer. HEENT Present- Ringing in the Ears. Not Present- Earache, Hearing Loss, Hoarseness, Nose Bleed, Oral Ulcers, Seasonal Allergies, Sinus Pain, Sore Throat, Visual Disturbances, Wears glasses/contact lenses and Yellow Eyes. Cardiovascular Present- Rapid Heart Rate. Not Present- Chest Pain, Difficulty Breathing Lying Down, Leg Cramps, Palpitations, Shortness of Breath and Swelling of Extremities. Gastrointestinal Not Present- Abdominal Pain, Bloating, Bloody Stool, Change  in Bowel Habits, Chronic diarrhea, Constipation, Difficulty Swallowing, Excessive gas, Gets full quickly at meals, Hemorrhoids, Indigestion, Nausea, Rectal Pain and Vomiting. Musculoskeletal Present- Joint Pain and Swelling of Extremities. Not Present- Back  Pain, Joint Stiffness, Muscle Pain and Muscle Weakness. Hematology Present- Easy Bruising and Persistent Infections. Not Present- Excessive bleeding, Gland problems and HIV.  Vitals Patsey Berthold CMA; 03/21/2016 4:01 PM) 03/21/2016 4:00 PM Weight: 164 lb Height: 65in Height was reported by patient. Body Surface Area: 1.82 m Body Mass Index: 27.29 kg/m  Temp.: 43F  Pulse: 72 (Regular)  BP: 128/80 (Sitting, Left Arm, Standard)       Physical Exam Marland Kitchen T. Deyana Wnuk MD; 03/21/2016 4:21 PM) The physical exam findings are as follows: Note:General: Alert, well-developed and well nourished Caucasian female, in no distress Skin: Warm and dry without rash or infection. HEENT: No palpable masses or thyromegaly. Sclera nonicteric. Pupils equal round and reactive. Lymph nodes: No cervical, supraclavicular, or axillary nodes palpable. Breasts: Some bruising and slight irregularity lateral and medial left breast postbiopsy. No discrete masses. No other palpable abnormalities. No skin changes or nipple crusting or inversion. Lungs: Breath sounds clear and equal. No wheezing or increased work of breathing. Cardiovascular: Regular rate and rhythm without murmer. No JVD or edema. Extremities: No edema or joint swelling or deformity. No chronic venous stasis changes. Neurologic: Alert and fully oriented. Gait normal. No focal weakness. Psychiatric: Normal mood and affect. Thought content appropriate with normal judgement and insight    Assessment & Plan Marland Kitchen T. Kessa Fairbairn MD; 03/21/2016 4:35 PM) RADIAL SCAR OF BREAST (N64.89) Impression: Radial scar or complex sclerosing lesion left breast following core biopsy for recent abnormal  mammogram and area of architectural distortion. Of note there is an original X-shaped clip nearby from fibrocystic change on biopsy and the current coil shaped clip is felt to be displaced somewhat laterally from the original lesion.  I discussed these findings in detail with the patient. We discussed the significance of her lesion and the slight risk of an underlying early breast cancer. We discussed that standard recommendation is excisional biopsy. I discussed the option of close clinical follow-up. She would prefer to have it removed which is what I recommended. We discussed radioactive seed localized lumpectomy including its nature and recovery as well as risks of anesthetic complications, bleeding, infection. We discussed that further therapy may well be needed if a cancer is discovered. All her questions were answered and she agrees to proceed. Current Plans Pt Education - [facility] Breast Biopsy HCI: discussed with patient and provided information. Radioactive seed localized left breast lumpectomy under general anesthesia as an outpatient

## 2016-04-05 ENCOUNTER — Ambulatory Visit
Admission: RE | Admit: 2016-04-05 | Discharge: 2016-04-05 | Disposition: A | Discharge status: Home / Self Care | Payer: Medicare Other | Source: Ambulatory Visit | Attending: General Surgery | Admitting: General Surgery

## 2016-04-05 ENCOUNTER — Encounter (HOSPITAL_BASED_OUTPATIENT_CLINIC_OR_DEPARTMENT_OTHER)
Admission: RE | Disposition: A | Discharge status: Home / Self Care | Payer: Self-pay | Source: Ambulatory Visit | Attending: General Surgery

## 2016-04-05 ENCOUNTER — Ambulatory Visit: Payer: Medicare Other | Admitting: Internal Medicine

## 2016-04-05 ENCOUNTER — Ambulatory Visit (HOSPITAL_BASED_OUTPATIENT_CLINIC_OR_DEPARTMENT_OTHER): Payer: Medicare Other | Admitting: Anesthesiology

## 2016-04-05 ENCOUNTER — Encounter (HOSPITAL_BASED_OUTPATIENT_CLINIC_OR_DEPARTMENT_OTHER): Payer: Self-pay | Admitting: Anesthesiology

## 2016-04-05 ENCOUNTER — Ambulatory Visit (HOSPITAL_BASED_OUTPATIENT_CLINIC_OR_DEPARTMENT_OTHER)
Admission: RE | Admit: 2016-04-05 | Discharge: 2016-04-05 | Disposition: A | Discharge status: Home / Self Care | Payer: Medicare Other | Source: Ambulatory Visit | Attending: General Surgery | Admitting: General Surgery

## 2016-04-05 DIAGNOSIS — L989 Disorder of the skin and subcutaneous tissue, unspecified: Secondary | ICD-10-CM | POA: Diagnosis present

## 2016-04-05 DIAGNOSIS — Z86711 Personal history of pulmonary embolism: Secondary | ICD-10-CM | POA: Diagnosis not present

## 2016-04-05 DIAGNOSIS — E7800 Pure hypercholesterolemia, unspecified: Secondary | ICD-10-CM | POA: Diagnosis not present

## 2016-04-05 DIAGNOSIS — N6012 Diffuse cystic mastopathy of left breast: Secondary | ICD-10-CM | POA: Diagnosis not present

## 2016-04-05 DIAGNOSIS — Z86718 Personal history of other venous thrombosis and embolism: Secondary | ICD-10-CM | POA: Insufficient documentation

## 2016-04-05 DIAGNOSIS — N6489 Other specified disorders of breast: Secondary | ICD-10-CM

## 2016-04-05 DIAGNOSIS — Z885 Allergy status to narcotic agent status: Secondary | ICD-10-CM | POA: Insufficient documentation

## 2016-04-05 DIAGNOSIS — K219 Gastro-esophageal reflux disease without esophagitis: Secondary | ICD-10-CM | POA: Insufficient documentation

## 2016-04-05 DIAGNOSIS — I10 Essential (primary) hypertension: Secondary | ICD-10-CM | POA: Insufficient documentation

## 2016-04-05 DIAGNOSIS — Z7982 Long term (current) use of aspirin: Secondary | ICD-10-CM | POA: Diagnosis not present

## 2016-04-05 DIAGNOSIS — Z79899 Other long term (current) drug therapy: Secondary | ICD-10-CM | POA: Diagnosis not present

## 2016-04-05 DIAGNOSIS — M1990 Unspecified osteoarthritis, unspecified site: Secondary | ICD-10-CM | POA: Diagnosis not present

## 2016-04-05 DIAGNOSIS — Z881 Allergy status to other antibiotic agents status: Secondary | ICD-10-CM | POA: Insufficient documentation

## 2016-04-05 DIAGNOSIS — Z888 Allergy status to other drugs, medicaments and biological substances status: Secondary | ICD-10-CM | POA: Insufficient documentation

## 2016-04-05 HISTORY — PX: BREAST LUMPECTOMY WITH RADIOACTIVE SEED LOCALIZATION: SHX6424

## 2016-04-05 SURGERY — BREAST LUMPECTOMY WITH RADIOACTIVE SEED LOCALIZATION
Anesthesia: General | Site: Breast | Laterality: Left

## 2016-04-05 MED ORDER — DEXAMETHASONE SODIUM PHOSPHATE 4 MG/ML IJ SOLN
INTRAMUSCULAR | Status: DC | PRN
  Administered 2016-04-05: 10 mg via INTRAVENOUS

## 2016-04-05 MED ORDER — FENTANYL CITRATE (PF) 100 MCG/2ML IJ SOLN: 50.0000 ug | INTRAMUSCULAR | Status: DC | PRN

## 2016-04-05 MED ORDER — FENTANYL CITRATE (PF) 100 MCG/2ML IJ SOLN
INTRAMUSCULAR | Status: AC
  Filled 2016-04-05: qty 2

## 2016-04-05 MED ORDER — PROPOFOL 10 MG/ML IV BOLUS
INTRAVENOUS | Status: AC
  Filled 2016-04-05: qty 20

## 2016-04-05 MED ORDER — SCOPOLAMINE 1 MG/3DAYS TD PT72: 1.0000 | MEDICATED_PATCH | Freq: Once | TRANSDERMAL | Status: DC | PRN

## 2016-04-05 MED ORDER — CEFAZOLIN SODIUM-DEXTROSE 2-4 GM/100ML-% IV SOLN
INTRAVENOUS | Status: AC
  Filled 2016-04-05: qty 100

## 2016-04-05 MED ORDER — PHENYLEPHRINE HCL 10 MG/ML IJ SOLN
INTRAMUSCULAR | Status: DC | PRN
  Administered 2016-04-05: 80 ug via INTRAVENOUS

## 2016-04-05 MED ORDER — OXYCODONE HCL 5 MG PO TABS: 5.0000 mg | ORAL_TABLET | Freq: Once | ORAL | Status: DC | PRN

## 2016-04-05 MED ORDER — PHENYLEPHRINE 40 MCG/ML (10ML) SYRINGE FOR IV PUSH (FOR BLOOD PRESSURE SUPPORT)
PREFILLED_SYRINGE | INTRAVENOUS | Status: AC
  Filled 2016-04-05: qty 10

## 2016-04-05 MED ORDER — ONDANSETRON HCL 4 MG/2ML IJ SOLN
INTRAMUSCULAR | Status: AC
  Filled 2016-04-05: qty 2

## 2016-04-05 MED ORDER — HYDROMORPHONE HCL 1 MG/ML IJ SOLN: 0.2500 mg | INTRAMUSCULAR | Status: DC | PRN

## 2016-04-05 MED ORDER — GLYCOPYRROLATE 0.2 MG/ML IJ SOLN
0.2000 mg | Freq: Once | INTRAMUSCULAR | Status: AC | PRN
  Administered 2016-04-05: 0.2 mg via INTRAVENOUS

## 2016-04-05 MED ORDER — MEPERIDINE HCL 25 MG/ML IJ SOLN: 6.2500 mg | INTRAMUSCULAR | Status: DC | PRN

## 2016-04-05 MED ORDER — ONDANSETRON HCL 4 MG/2ML IJ SOLN
INTRAMUSCULAR | Status: DC | PRN
  Administered 2016-04-05: 4 mg via INTRAVENOUS

## 2016-04-05 MED ORDER — CHLORHEXIDINE GLUCONATE 4 % EX LIQD: 1.0000 "application " | Freq: Once | CUTANEOUS | Status: DC

## 2016-04-05 MED ORDER — BUPIVACAINE-EPINEPHRINE (PF) 0.25% -1:200000 IJ SOLN
INTRAMUSCULAR | Status: DC | PRN
  Administered 2016-04-05: 20 mL

## 2016-04-05 MED ORDER — MIDAZOLAM HCL 2 MG/2ML IJ SOLN: 1.0000 mg | INTRAMUSCULAR | Status: DC | PRN

## 2016-04-05 MED ORDER — CEFAZOLIN SODIUM-DEXTROSE 2-4 GM/100ML-% IV SOLN
2.0000 g | INTRAVENOUS | Status: AC
  Administered 2016-04-05: 2 g via INTRAVENOUS

## 2016-04-05 MED ORDER — LACTATED RINGERS IV SOLN
INTRAVENOUS | Status: DC
  Administered 2016-04-05 (×2): via INTRAVENOUS
  Administered 2016-04-05: 10 mL/h via INTRAVENOUS

## 2016-04-05 MED ORDER — BUPIVACAINE-EPINEPHRINE (PF) 0.25% -1:200000 IJ SOLN
INTRAMUSCULAR | Status: AC
  Filled 2016-04-05: qty 30

## 2016-04-05 MED ORDER — FENTANYL CITRATE (PF) 100 MCG/2ML IJ SOLN
INTRAMUSCULAR | Status: DC | PRN
Start: 2016-04-05 — End: 2016-04-05
  Administered 2016-04-05 (×2): 50 ug via INTRAVENOUS

## 2016-04-05 MED ORDER — LIDOCAINE 2% (20 MG/ML) 5 ML SYRINGE
INTRAMUSCULAR | Status: AC
  Filled 2016-04-05: qty 5

## 2016-04-05 MED ORDER — GLYCOPYRROLATE 0.2 MG/ML IV SOSY
PREFILLED_SYRINGE | INTRAVENOUS | Status: AC
  Filled 2016-04-05: qty 3

## 2016-04-05 MED ORDER — DEXAMETHASONE SODIUM PHOSPHATE 10 MG/ML IJ SOLN
INTRAMUSCULAR | Status: AC
  Filled 2016-04-05: qty 1

## 2016-04-05 MED ORDER — PROPOFOL 10 MG/ML IV BOLUS
INTRAVENOUS | Status: DC | PRN
  Administered 2016-04-05: 100 mg via INTRAVENOUS

## 2016-04-05 MED ORDER — OXYCODONE HCL 5 MG/5ML PO SOLN: 5.0000 mg | Freq: Once | ORAL | Status: DC | PRN

## 2016-04-05 MED ORDER — HYDROCODONE-ACETAMINOPHEN 5-325 MG PO TABS: 1.0000 | ORAL_TABLET | ORAL | Status: DC | PRN

## 2016-04-05 MED ORDER — LIDOCAINE HCL (CARDIAC) 20 MG/ML IV SOLN
INTRAVENOUS | Status: DC | PRN
  Administered 2016-04-05: 30 mg via INTRAVENOUS

## 2016-04-05 SURGICAL SUPPLY — 48 items
APPLIER CLIP 9.375 MED OPEN (MISCELLANEOUS)
BINDER BREAST LRG (GAUZE/BANDAGES/DRESSINGS) ×2 IMPLANT
BINDER BREAST MEDIUM (GAUZE/BANDAGES/DRESSINGS) IMPLANT
BINDER BREAST XLRG (GAUZE/BANDAGES/DRESSINGS) IMPLANT
BINDER BREAST XXLRG (GAUZE/BANDAGES/DRESSINGS) IMPLANT
BLADE SURG 15 STRL LF DISP TIS (BLADE) ×1 IMPLANT
BLADE SURG 15 STRL SS (BLADE) ×1
CANISTER SUC SOCK COL 7IN (MISCELLANEOUS) IMPLANT
CANISTER SUCT 1200ML W/VALVE (MISCELLANEOUS) IMPLANT
CHLORAPREP W/TINT 26ML (MISCELLANEOUS) ×2 IMPLANT
CLIP APPLIE 9.375 MED OPEN (MISCELLANEOUS) IMPLANT
CLIP TI WIDE RED SMALL 6 (CLIP) ×2 IMPLANT
COVER BACK TABLE 60X90IN (DRAPES) ×2 IMPLANT
COVER MAYO STAND STRL (DRAPES) ×2 IMPLANT
COVER PROBE W GEL 5X96 (DRAPES) ×2 IMPLANT
DECANTER SPIKE VIAL GLASS SM (MISCELLANEOUS) IMPLANT
DEVICE DUBIN W/COMP PLATE 8390 (MISCELLANEOUS) ×2 IMPLANT
DRAPE LAPAROSCOPIC ABDOMINAL (DRAPES) ×2 IMPLANT
DRAPE UTILITY XL STRL (DRAPES) ×2 IMPLANT
ELECT COATED BLADE 2.86 ST (ELECTRODE) ×2 IMPLANT
ELECT REM PT RETURN 9FT ADLT (ELECTROSURGICAL) ×2
ELECTRODE REM PT RTRN 9FT ADLT (ELECTROSURGICAL) ×1 IMPLANT
GLOVE BIOGEL PI IND STRL 7.0 (GLOVE) ×1 IMPLANT
GLOVE BIOGEL PI IND STRL 8 (GLOVE) ×2 IMPLANT
GLOVE BIOGEL PI INDICATOR 7.0 (GLOVE) ×1
GLOVE BIOGEL PI INDICATOR 8 (GLOVE) ×2
GLOVE ECLIPSE 7.5 STRL STRAW (GLOVE) IMPLANT
GLOVE SURG SS PI 8.0 STRL IVOR (GLOVE) ×2 IMPLANT
GOWN STRL REUS W/ TWL LRG LVL3 (GOWN DISPOSABLE) ×2 IMPLANT
GOWN STRL REUS W/ TWL XL LVL3 (GOWN DISPOSABLE) ×1 IMPLANT
GOWN STRL REUS W/TWL LRG LVL3 (GOWN DISPOSABLE) ×2
GOWN STRL REUS W/TWL XL LVL3 (GOWN DISPOSABLE) ×1
ILLUMINATOR WAVEGUIDE N/F (MISCELLANEOUS) ×2 IMPLANT
KIT MARKER MARGIN INK (KITS) ×2 IMPLANT
LIQUID BAND (GAUZE/BANDAGES/DRESSINGS) ×2 IMPLANT
NEEDLE HYPO 25X1 1.5 SAFETY (NEEDLE) ×2 IMPLANT
NS IRRIG 1000ML POUR BTL (IV SOLUTION) ×2 IMPLANT
PACK BASIN DAY SURGERY FS (CUSTOM PROCEDURE TRAY) ×2 IMPLANT
PENCIL BUTTON HOLSTER BLD 10FT (ELECTRODE) ×2 IMPLANT
SLEEVE SCD COMPRESS KNEE MED (MISCELLANEOUS) ×2 IMPLANT
SPONGE LAP 4X18 X RAY DECT (DISPOSABLE) ×2 IMPLANT
SUT MON AB 5-0 PS2 18 (SUTURE) ×2 IMPLANT
SUT VICRYL 3-0 CR8 SH (SUTURE) ×2 IMPLANT
SYR CONTROL 10ML LL (SYRINGE) ×2 IMPLANT
TOWEL OR 17X24 6PK STRL BLUE (TOWEL DISPOSABLE) ×2 IMPLANT
TOWEL OR NON WOVEN STRL DISP B (DISPOSABLE) IMPLANT
TUBE CONNECTING 20X1/4 (TUBING) IMPLANT
YANKAUER SUCT BULB TIP NO VENT (SUCTIONS) IMPLANT

## 2016-04-05 NOTE — Anesthesia Procedure Notes (Signed)
Procedure Name: LMA Insertion Date/Time: 04/05/2016 1:39 PM Performed by: Toula Moos L Pre-anesthesia Checklist: Patient identified, Emergency Drugs available, Suction available, Patient being monitored and Timeout performed Patient Re-evaluated:Patient Re-evaluated prior to inductionOxygen Delivery Method: Circle system utilized Preoxygenation: Pre-oxygenation with 100% oxygen Intubation Type: IV induction Ventilation: Mask ventilation without difficulty LMA: LMA inserted LMA Size: 4.0 Number of attempts: 1 Airway Equipment and Method: Bite block Placement Confirmation: positive ETCO2 Tube secured with: Tape Dental Injury: Teeth and Oropharynx as per pre-operative assessment

## 2016-04-05 NOTE — Interval H&P Note (Signed)
History and Physical Interval Note:  04/05/2016 1:24 PM  [name]  has presented today for surgery, with the diagnosis of Complex sclerosing lesion left breast   The various methods of treatment have been discussed with the patient and family. After consideration of risks, benefits and other options for treatment, the patient has consented to  Procedure(s): LEFT BREAST LUMPECTOMY WITH RADIOACTIVE SEED LOCALIZATION (Left) as a surgical intervention .  The patient's history has been reviewed, patient examined, no change in status, stable for surgery.  I have reviewed the patient's chart and labs.  Questions were answered to the patient's satisfaction.     Zacheriah Stumpe T

## 2016-04-05 NOTE — Transfer of Care (Signed)
Immediate Anesthesia Transfer of Care Note  Patient: [name]  Procedure(s) Performed: Procedure(s): LEFT BREAST LUMPECTOMY WITH RADIOACTIVE SEED LOCALIZATION (Left)  Patient Location: PACU  Anesthesia Type:General  Level of Consciousness: awake and patient cooperative  Airway & Oxygen Therapy: Patient Spontanous Breathing and Patient connected to face mask oxygen  Post-op Assessment: Report given to RN and Post -op Vital signs reviewed and stable  Post vital signs: Reviewed and stable  Last Vitals:  Filed Vitals:   04/05/16 1128 04/05/16 1439  BP: 147/60   Pulse: 69 85  Temp: 36.8 C   Resp: 18 15    Last Pain: There were no vitals filed for this visit.       Complications: No apparent anesthesia complications

## 2016-04-05 NOTE — Op Note (Signed)
Preoperative Diagnosis: Complex sclerosing lesion left breast   Postoprative Diagnosis: Complex sclerosing lesion left breast   Procedure: Procedure(s): LEFT BREAST LUMPECTOMY WITH RADIOACTIVE SEED LOCALIZATION   Surgeon: Excell Seltzer T   Assistants: none  Anesthesia:  General LMA anesthesia  Indications: Patient has a history of an abnormal mammogram of the left breast  About 6 months ago with an area of distortion that was biopsied showing only fibrocystic change. Short-term six-month follow-up was  Planned and showed  No abnormality in the previously biopsied area but a new small area of architectural distortion separate from this.Large core biopsy has shown  Complex sclerosing lesion. Excision  Was recommended and after discussion with the patient regarding options and benefits and risks she  Desires excision. Of note is the  Second biopsy clip has migrated well away from the area of distortion.    Procedure Detail:  Patient had  Undergone radioactive seed localization of the area of distortion which I reviewed with the radiologist showing the scene and at the medial edge of the mammographic abnormalitybut as above well away from the biopsy clip.  Patient was taken to the operating room, placed in supine position on the operating table,  And general endotracheal anesthesia induced. She received preoperative IV antibiotics. The left breast was widely sterilely prepped and draped.  Patient timeout was performed and correct procedure verified. The neoprobe was used to localize the seed in the medial left breast. I made a  Circumareolar curvilinear incision medially and skin and subcutaneous flaps were raised medially over the seed. Using the neoprobe for guidance I excised a specimen of tissue around the seed.  There was a firm palpable abnormality within the specimen likely representing the previous biopsy site. The seed was fairly superficial in the specimen and did become dislodged during  excision and was secured separately in a cup. The specimen was removed.  As the seed was felt to be a little medial to the area of distortion I took an additional small amount of tissue laterally  To  The original specimen and sent this separately. The seed was confirmed radiographically and a separate cup and the specimen was x-rayed although as expected no biopsy clip present.  Hemostasis was obtained with cautery. The soft tissue was infiltrated with Marcaine. The deep breast and subcutaneous tissue was closed with interrupted 3-0 Vicryl and the skin with subcuticular 5-0 Monocryl and Liquiban. Sponge needle and instrument counts were correct.    Findings: As above  Estimated Blood Loss:  Minimal         Drains: none  Blood Given: none          Specimens: #1 left breast lumpectomy     #2 further lateral tissue        Complications:  * No complications entered in OR log *         Disposition: PACU - hemodynamically stable.         Condition: stable

## 2016-04-05 NOTE — Discharge Instructions (Signed)
[facility] °Office Phone Number [phone] ° °BREAST BIOPSY/ PARTIAL MASTECTOMY: POST OP INSTRUCTIONS ° °Always review your discharge instruction sheet given to you by the facility where your surgery was performed. ° °IF YOU HAVE DISABILITY OR FAMILY LEAVE FORMS, YOU MUST BRING THEM TO THE OFFICE FOR PROCESSING.  DO NOT GIVE THEM TO YOUR DOCTOR. ° °1. A prescription for pain medication may be given to you upon discharge.  Take your pain medication as prescribed, if needed.  If narcotic pain medicine is not needed, then you may take acetaminophen (Tylenol) or ibuprofen (Advil) as needed. °2. Take your usually prescribed medications unless otherwise directed °3. If you need a refill on your pain medication, please contact your pharmacy.  They will contact our office to request authorization.  Prescriptions will not be filled after 5pm or on week-ends. °4. You should eat very light the first 24 hours after surgery, such as soup, crackers, pudding, etc.  Resume your normal diet the day after surgery. °5. Most patients will experience some swelling and bruising in the breast.  Ice packs and a good support bra will help.  Swelling and bruising can take several days to resolve.  °6. It is common to experience some constipation if taking pain medication after surgery.  Increasing fluid intake and taking a stool softener will usually help or prevent this problem from occurring.  A mild laxative (Milk of Magnesia or Miralax) should be taken according to package directions if there are no bowel movements after 48 hours. °7. Unless discharge instructions indicate otherwise, you may remove your bandages 24-48 hours after surgery, and you may shower at that time.  You may have steri-strips (small skin tapes) in place directly over the incision.  These strips should be left on the skin for 7-10 days.  If your surgeon used skin glue on the incision, you may shower in 24 hours.  The glue will flake off over the  next 2-3 weeks.  Any sutures or staples will be removed at the office during your follow-up visit. °8. ACTIVITIES:  You may resume regular daily activities (gradually increasing) beginning the next day.  Wearing a good support bra or sports bra minimizes pain and swelling.  You may have sexual intercourse when it is comfortable. °a. You may drive when you no longer are taking prescription pain medication, you can comfortably wear a seatbelt, and you can safely maneuver your car and apply brakes. °b. RETURN TO WORK:  ______________________________________________________________________________________ °9. You should see your doctor in the office for a follow-up appointment approximately two weeks after your surgery.  Your doctor’s nurse will typically make your follow-up appointment when she calls you with your pathology report.  Expect your pathology report 2-3 business days after your surgery.  You may call to check if you do not hear from us after three days. °10. OTHER INSTRUCTIONS: _______________________________________________________________________________________________ _____________________________________________________________________________________________________________________________________ °_____________________________________________________________________________________________________________________________________ °_____________________________________________________________________________________________________________________________________ ° °WHEN TO CALL YOUR DOCTOR: °1. Fever over 101.0 °2. Nausea and/or vomiting. °3. Extreme swelling or bruising. °4. Continued bleeding from incision. °5. Increased pain, redness, or drainage from the incision. ° °The clinic staff is available to answer your questions during regular business hours.  Please don’t hesitate to call and ask to speak to one of the nurses for clinical concerns.  If you have a medical emergency, go to the nearest  emergency room or call 911.  A surgeon from [facility] is always on call at the hospital. ° °For further questions, please visit [email or web address]  ° ° ° °  Post Anesthesia Home Care Instructions ° °Activity: °Get plenty of rest for the remainder of the day. A responsible adult should stay with you for 24 hours following the procedure.  °For the next 24 hours, DO NOT: °-Drive a car °-Operate machinery °-Drink alcoholic beverages °-Take any medication unless instructed by your physician °-Make any legal decisions or sign important papers. ° °Meals: °Start with liquid foods such as gelatin or soup. Progress to regular foods as tolerated. Avoid greasy, spicy, heavy foods. If nausea and/or vomiting occur, drink only clear liquids until the nausea and/or vomiting subsides. Call your physician if vomiting continues. ° °Special Instructions/Symptoms: °Your throat may feel dry or sore from the anesthesia or the breathing tube placed in your throat during surgery. If this causes discomfort, gargle with warm salt water. The discomfort should disappear within 24 hours. ° °If you had a scopolamine patch placed behind your ear for the management of post- operative nausea and/or vomiting: ° °1. The medication in the patch is effective for 72 hours, after which it should be removed.  Wrap patch in a tissue and discard in the trash. Wash hands thoroughly with soap and water. °2. You may remove the patch earlier than 72 hours if you experience unpleasant side effects which may include dry mouth, dizziness or visual disturbances. °3. Avoid touching the patch. Wash your hands with soap and water after contact with the patch. °  ° °

## 2016-04-05 NOTE — Anesthesia Preprocedure Evaluation (Signed)
Anesthesia Evaluation  Patient identified by MRN, date of birth, ID band Patient awake    Reviewed: Allergy & Precautions, NPO status , Patient's Chart, lab work & pertinent test results  Airway Mallampati: I  TM Distance: >3 FB Neck ROM: Full    Dental  (+) Teeth Intact, Dental Advisory Given   Pulmonary    breath sounds clear to auscultation       Cardiovascular hypertension, Pt. on medications  Rhythm:Regular Rate:Normal     Neuro/Psych    GI/Hepatic GERD  Medicated and Controlled,  Endo/Other    Renal/GU      Musculoskeletal   Abdominal   Peds  Hematology   Anesthesia Other Findings   Reproductive/Obstetrics                             Anesthesia Physical Anesthesia Plan  ASA: II  Anesthesia Plan: General   Post-op Pain Management:    Induction: Intravenous  Airway Management Planned: LMA  Additional Equipment:   Intra-op Plan:   Post-operative Plan: Extubation in OR  Informed Consent: I have reviewed the patients History and Physical, chart, labs and discussed the procedure including the risks, benefits and alternatives for the proposed anesthesia with the patient or authorized representative who has indicated his/her understanding and acceptance.   Dental advisory given  Plan Discussed with: CRNA, Anesthesiologist and Surgeon  Anesthesia Plan Comments:         Anesthesia Quick Evaluation  

## 2016-04-05 NOTE — Anesthesia Postprocedure Evaluation (Signed)
Anesthesia Post Note  Patient: [name]  Procedure(s) Performed: Procedure(s) (LRB): LEFT BREAST LUMPECTOMY WITH RADIOACTIVE SEED LOCALIZATION (Left)  Patient location during evaluation: PACU Anesthesia Type: General Level of consciousness: awake and alert Pain management: pain level controlled Vital Signs Assessment: post-procedure vital signs reviewed and stable Respiratory status: spontaneous breathing, nonlabored ventilation and respiratory function stable Cardiovascular status: blood pressure returned to baseline and stable Postop Assessment: no signs of nausea or vomiting Anesthetic complications: no    Last Vitals:  Filed Vitals:   04/05/16 1445 04/05/16 1500  BP: 145/65 152/72  Pulse: 83 85  Temp:    Resp: 13 18    Last Pain:  Filed Vitals:   04/05/16 1506  PainSc: 1                  Tanairy Payeur A

## 2016-04-06 ENCOUNTER — Encounter (HOSPITAL_BASED_OUTPATIENT_CLINIC_OR_DEPARTMENT_OTHER): Payer: Self-pay | Admitting: General Surgery

## 2016-04-25 ENCOUNTER — Other Ambulatory Visit: Payer: Self-pay | Admitting: Internal Medicine

## 2016-05-04 ENCOUNTER — Encounter: Payer: Self-pay | Admitting: Internal Medicine

## 2016-05-04 ENCOUNTER — Other Ambulatory Visit: Payer: Self-pay | Admitting: *Deleted

## 2016-05-04 ENCOUNTER — Ambulatory Visit (INDEPENDENT_AMBULATORY_CARE_PROVIDER_SITE_OTHER): Payer: Medicare Other | Admitting: Internal Medicine

## 2016-05-04 ENCOUNTER — Other Ambulatory Visit (INDEPENDENT_AMBULATORY_CARE_PROVIDER_SITE_OTHER): Payer: Medicare Other

## 2016-05-04 VITALS — BP 130/72 | HR 61 | Temp 98.5°F | Resp 20 | Wt 166.0 lb

## 2016-05-04 DIAGNOSIS — Z0001 Encounter for general adult medical examination with abnormal findings: Secondary | ICD-10-CM

## 2016-05-04 DIAGNOSIS — R6889 Other general symptoms and signs: Secondary | ICD-10-CM

## 2016-05-04 DIAGNOSIS — L989 Disorder of the skin and subcutaneous tissue, unspecified: Secondary | ICD-10-CM | POA: Diagnosis not present

## 2016-05-04 DIAGNOSIS — E785 Hyperlipidemia, unspecified: Secondary | ICD-10-CM | POA: Diagnosis not present

## 2016-05-04 DIAGNOSIS — Z1159 Encounter for screening for other viral diseases: Secondary | ICD-10-CM | POA: Diagnosis not present

## 2016-05-04 DIAGNOSIS — Z111 Encounter for screening for respiratory tuberculosis: Secondary | ICD-10-CM | POA: Diagnosis not present

## 2016-05-04 DIAGNOSIS — R7302 Impaired glucose tolerance (oral): Secondary | ICD-10-CM | POA: Diagnosis not present

## 2016-05-04 DIAGNOSIS — I10 Essential (primary) hypertension: Secondary | ICD-10-CM | POA: Diagnosis not present

## 2016-05-04 LAB — CBC WITH DIFFERENTIAL/PLATELET
Basophils Absolute: 0 10*3/uL (ref 0.0–0.1)
Basophils Relative: 0.4 % (ref 0.0–3.0)
EOS PCT: 3.7 % (ref 0.0–5.0)
Eosinophils Absolute: 0.3 10*3/uL (ref 0.0–0.7)
HEMATOCRIT: 37.2 % (ref 36.0–46.0)
HEMOGLOBIN: 12.6 g/dL (ref 12.0–15.0)
Lymphocytes Relative: 24.5 % (ref 12.0–46.0)
Lymphs Abs: 2 10*3/uL (ref 0.7–4.0)
MCHC: 33.7 g/dL (ref 30.0–36.0)
MCV: 97.8 fl (ref 78.0–100.0)
MONOS PCT: 7.8 % (ref 3.0–12.0)
Monocytes Absolute: 0.6 10*3/uL (ref 0.1–1.0)
Neutro Abs: 5.3 10*3/uL (ref 1.4–7.7)
Neutrophils Relative %: 63.6 % (ref 43.0–77.0)
Platelets: 333 10*3/uL (ref 150.0–400.0)
RBC: 3.81 Mil/uL — AB (ref 3.87–5.11)
RDW: 15.5 % (ref 11.5–15.5)
WBC: 8.3 10*3/uL (ref 4.0–10.5)

## 2016-05-04 LAB — BASIC METABOLIC PANEL
BUN: 14 mg/dL (ref 6–23)
CALCIUM: 10 mg/dL (ref 8.4–10.5)
CO2: 28 meq/L (ref 19–32)
Chloride: 98 mEq/L (ref 96–112)
Creatinine, Ser: 1.01 mg/dL (ref 0.40–1.20)
GFR: 57.43 mL/min — ABNORMAL LOW (ref >60.00)
GLUCOSE: 100 mg/dL — AB (ref 70–99)
Potassium: 3.7 mEq/L (ref 3.5–5.1)
SODIUM: 134 meq/L — AB (ref 135–145)

## 2016-05-04 LAB — HEPATIC FUNCTION PANEL
ALT: 21 U/L (ref 0–35)
AST: 24 U/L (ref 0–37)
Albumin: 4.5 g/dL (ref 3.5–5.2)
Alkaline Phosphatase: 55 U/L (ref 39–117)
Bilirubin, Direct: 0.1 mg/dL (ref 0.0–0.3)
Total Bilirubin: 0.5 mg/dL (ref 0.2–1.2)
Total Protein: 7.4 g/dL (ref 6.0–8.3)

## 2016-05-04 LAB — URINALYSIS, ROUTINE W REFLEX MICROSCOPIC
BILIRUBIN URINE: NEGATIVE
HGB URINE DIPSTICK: NEGATIVE
KETONES UR: NEGATIVE
NITRITE: NEGATIVE
PH: 6.5 (ref 5.0–8.0)
RBC / HPF: NONE SEEN (ref 0–?)
Specific Gravity, Urine: 1.01 (ref 1.000–1.030)
TOTAL PROTEIN, URINE-UPE24: NEGATIVE
URINE GLUCOSE: NEGATIVE
UROBILINOGEN UA: 0.2 (ref 0.0–1.0)

## 2016-05-04 LAB — HEMOGLOBIN A1C: Hgb A1c MFr Bld: 5.3 % (ref 4.6–6.5)

## 2016-05-04 LAB — TSH: TSH: 3.97 u[IU]/mL (ref 0.35–4.50)

## 2016-05-04 LAB — LIPID PANEL
CHOL/HDL RATIO: 2
Cholesterol: 136 mg/dL (ref 0–200)
HDL: 60.4 mg/dL (ref >39.00)
LDL Cholesterol: 57 mg/dL (ref 0–99)
NONHDL: 75.9
Triglycerides: 93 mg/dL (ref 0.0–149.0)
VLDL: 18.6 mg/dL (ref 0.0–40.0)

## 2016-05-04 MED ORDER — PRAVASTATIN SODIUM 40 MG PO TABS
40.0000 mg | ORAL_TABLET | Freq: Every day | ORAL | Status: DC
Start: 2016-05-04 — End: 2016-11-01
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
## 2016-05-04 NOTE — Assessment & Plan Note (Addendum)
To right upper post thigh, unusual, slightly ulceration and dark, for dermatology referral  In addition to the time spent performing CPE, I spent an additional 25 minutes face to face,in which greater than 50% of this time was spent in counseling and coordination of care for patient's acute illness as documented.

## 2016-05-04 NOTE — Progress Notes (Signed)
Pre visit review using our clinic review tool, if applicable. No additional management support is needed unless otherwise documented below in the visit note. 

## 2016-05-04 NOTE — Patient Instructions (Addendum)
You had the TB skin test placed today; please return on Friday July 21 to have this checked by the nurse.    Please continue all other medications as before, and refills have been done if requested.  Please have the pharmacy call with any other refills you may need.  Please continue your efforts at being more active, low cholesterol diet, and weight control.  You are otherwise up to date with prevention measures today.  Please keep your appointments with your specialists as you may have planned  You will be contacted regarding the referral for: Dermatology  Please go to the LAB in the Basement (turn left off the elevator) for the tests to be done today  You will be contacted by phone if any changes need to be made immediately.  Otherwise, you will receive a letter about your results with an explanation, but please check with MyChart first.  Please remember to sign up for MyChart if you have not done so, as this will be important to you in the future with finding out test results, communicating by private email, and scheduling acute appointments online when needed.  Please return in 6 months, or sooner if needed

## 2016-05-04 NOTE — Assessment & Plan Note (Signed)

## 2016-05-05 LAB — HEPATITIS C ANTIBODY: HCV Ab: NEGATIVE

## 2016-05-06 LAB — TB SKIN TEST
INDURATION: 0 mm
TB SKIN TEST: NEGATIVE

## 2016-05-08 NOTE — Assessment & Plan Note (Signed)
stable overall by history and exam, recent data reviewed with pt, and pt to continue medical treatment as before,  to f/u any worsening symptoms or concerns Lab Results  Component Value Date   HGBA1C 5.3 05/04/2016   

## 2016-05-08 NOTE — Assessment & Plan Note (Signed)
stable overall by history and exam, recent data reviewed with pt, and pt to continue medical treatment as before,  to f/u any worsening symptoms or concerns BP Readings from Last 3 Encounters:  05/04/16 130/72  04/05/16 (!) 151/58  02/03/16 130/80

## 2016-05-08 NOTE — Assessment & Plan Note (Signed)
stable overall by history and exam, recent data reviewed with pt, and pt to continue medical treatment as before,  to f/u any worsening symptoms or concerns Lab Results  Component Value Date   LDLCALC 57 05/04/2016

## 2016-05-11 ENCOUNTER — Telehealth: Payer: Self-pay | Admitting: Internal Medicine

## 2016-05-11 NOTE — Telephone Encounter (Signed)
Pt scheduled  

## 2016-05-11 NOTE — Telephone Encounter (Signed)
Patient called to check on dermatology referral. Please follow up

## 2016-06-03 ENCOUNTER — Other Ambulatory Visit: Payer: Self-pay | Admitting: Internal Medicine

## 2016-07-04 ENCOUNTER — Telehealth: Payer: Self-pay

## 2016-07-04 NOTE — Telephone Encounter (Signed)
Spoke to patient

## 2016-07-04 NOTE — Telephone Encounter (Signed)
Patient is requesting the nurse to give her call. She is very unsure about if she needs be seeing her GYN. Could you please follow up with her once you have time. Thank you.

## 2016-07-21 ENCOUNTER — Ambulatory Visit (INDEPENDENT_AMBULATORY_CARE_PROVIDER_SITE_OTHER): Payer: Medicare Other

## 2016-07-21 DIAGNOSIS — Z23 Encounter for immunization: Secondary | ICD-10-CM | POA: Diagnosis not present

## 2016-08-08 ENCOUNTER — Other Ambulatory Visit: Payer: Self-pay | Admitting: Internal Medicine

## 2016-08-08 DIAGNOSIS — Z1231 Encounter for screening mammogram for malignant neoplasm of breast: Secondary | ICD-10-CM

## 2016-08-30 ENCOUNTER — Other Ambulatory Visit: Payer: Self-pay | Admitting: Internal Medicine

## 2016-09-02 ENCOUNTER — Ambulatory Visit
Admission: RE | Admit: 2016-09-02 | Discharge: 2016-09-02 | Disposition: A | Discharge status: Home / Self Care | Payer: Medicare Other | Source: Ambulatory Visit | Attending: Internal Medicine | Admitting: Internal Medicine

## 2016-09-02 DIAGNOSIS — Z1231 Encounter for screening mammogram for malignant neoplasm of breast: Secondary | ICD-10-CM

## 2016-09-21 ENCOUNTER — Other Ambulatory Visit: Payer: Self-pay | Admitting: Internal Medicine

## 2016-09-27 ENCOUNTER — Other Ambulatory Visit: Payer: Self-pay | Admitting: Internal Medicine

## 2016-09-30 ENCOUNTER — Ambulatory Visit (INDEPENDENT_AMBULATORY_CARE_PROVIDER_SITE_OTHER)
Admission: RE | Admit: 2016-09-30 | Discharge: 2016-09-30 | Disposition: A | Discharge status: Home / Self Care | Payer: Medicare Other | Source: Ambulatory Visit | Attending: Internal Medicine | Admitting: Internal Medicine

## 2016-09-30 ENCOUNTER — Ambulatory Visit (INDEPENDENT_AMBULATORY_CARE_PROVIDER_SITE_OTHER): Payer: Medicare Other | Admitting: Internal Medicine

## 2016-09-30 ENCOUNTER — Other Ambulatory Visit: Payer: Medicare Other

## 2016-09-30 ENCOUNTER — Encounter: Payer: Self-pay | Admitting: Internal Medicine

## 2016-09-30 DIAGNOSIS — F418 Other specified anxiety disorders: Secondary | ICD-10-CM | POA: Diagnosis not present

## 2016-09-30 DIAGNOSIS — M25551 Pain in right hip: Secondary | ICD-10-CM | POA: Diagnosis not present

## 2016-09-30 DIAGNOSIS — I10 Essential (primary) hypertension: Secondary | ICD-10-CM

## 2016-09-30 MED ORDER — KETOROLAC TROMETHAMINE 30 MG/ML IJ SOLN
30.0000 mg | Freq: Once | INTRAMUSCULAR | Status: AC
  Administered 2016-09-30: 30 mg via INTRAMUSCULAR

## 2016-09-30 NOTE — Progress Notes (Signed)
Pre visit review using our clinic review tool, if applicable. No additional management support is needed unless otherwise documented below in the visit note. 

## 2016-09-30 NOTE — Patient Instructions (Addendum)
Ok to stop the citalopram  You had the pain shot today (toradol)  Please continue all other medications as before, including the tramadol for pain  Please have the pharmacy call with any other refills you may need.  You will be contacted regarding the referral for: Dr Smith/sports medicine (or you can make an appt with him at the scheduling desk as you leave)  Please keep your appointments with your specialists as you may have planned  Please go to the XRAY Department in the Basement (go straight as you get off the elevator) for the x-ray testing  You will be contacted by phone if any changes need to be made immediately.  Otherwise, you will receive a letter about your results with an explanation, but please check with MyChart first.  Please remember to sign up for MyChart if you have not done so, as this will be important to you in the future with finding out test results, communicating by private email, and scheduling acute appointments online when needed.
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
## 2016-10-01 NOTE — Assessment & Plan Note (Signed)
With acute pain x 1 wk, with exam suggestive of right hip DJD as well as right hip bursitis, as well as pain to right lumbar due to radiation of the right hip joint pain vs lumbar DJD/DDD vs other; for hip/pelvis films, toradol IM 30 today, cont tramadol prn as at home, and refer Dr Smith/sport med

## 2016-10-01 NOTE — Assessment & Plan Note (Signed)
stable overall by history and exam, recent data reviewed with pt, and pt to continue medical treatment as before,  to f/u any worsening symptoms or concerns BP Readings from Last 3 Encounters:  09/30/16 (!) 142/64  05/04/16 130/72  04/05/16 (!) 151/58

## 2016-10-01 NOTE — Assessment & Plan Note (Signed)
Ok for trial off celexa - to d/c now

## 2016-10-03 ENCOUNTER — Telehealth: Payer: Self-pay | Admitting: Internal Medicine

## 2016-10-03 DIAGNOSIS — M25559 Pain in unspecified hip: Secondary | ICD-10-CM

## 2016-10-03 NOTE — Telephone Encounter (Signed)
Patient requesting call back as soon as x ray is reviewed.

## 2016-10-04 NOTE — Telephone Encounter (Signed)
Please advise patient is wanting to see Dr. Tamala Julian

## 2016-10-04 NOTE — Telephone Encounter (Signed)
Xray of hips showed both hips with at least mild and maybe moderate arthritis, as well as some arthritis of the lower back as well.  I can refer her to Dr Smith/sport med or ortho if she wants

## 2016-10-04 NOTE — Telephone Encounter (Signed)
Referral done

## 2016-10-20 DIAGNOSIS — Z9849 Cataract extraction status, unspecified eye: Secondary | ICD-10-CM | POA: Diagnosis not present

## 2016-10-20 DIAGNOSIS — Z961 Presence of intraocular lens: Secondary | ICD-10-CM | POA: Diagnosis not present

## 2016-10-20 DIAGNOSIS — H11423 Conjunctival edema, bilateral: Secondary | ICD-10-CM | POA: Diagnosis not present

## 2016-10-20 DIAGNOSIS — Z79899 Other long term (current) drug therapy: Secondary | ICD-10-CM | POA: Diagnosis not present

## 2016-10-20 DIAGNOSIS — H18413 Arcus senilis, bilateral: Secondary | ICD-10-CM | POA: Diagnosis not present

## 2016-10-20 DIAGNOSIS — H1131 Conjunctival hemorrhage, right eye: Secondary | ICD-10-CM | POA: Diagnosis not present

## 2016-10-24 ENCOUNTER — Other Ambulatory Visit: Payer: Self-pay | Admitting: *Deleted

## 2016-10-24 MED ORDER — METOPROLOL TARTRATE 50 MG PO TABS: 50.0000 mg | ORAL_TABLET | Freq: Two times a day (BID) | ORAL | 1 refills | Status: DC

## 2016-10-24 NOTE — Telephone Encounter (Signed)
Rec'd call pt states due to [facility], and needing [facility]ill sent on her Metoprolol. [facility]...[name]

## 2016-10-26 ENCOUNTER — Ambulatory Visit (INDEPENDENT_AMBULATORY_CARE_PROVIDER_SITE_OTHER): Payer: Medicare HMO | Admitting: Internal Medicine

## 2016-10-26 ENCOUNTER — Encounter: Payer: Self-pay | Admitting: Internal Medicine

## 2016-10-26 VITALS — BP 136/70 | HR 72 | Temp 98.8°F | Resp 20 | Wt 171.0 lb

## 2016-10-26 DIAGNOSIS — I10 Essential (primary) hypertension: Secondary | ICD-10-CM

## 2016-10-26 DIAGNOSIS — M069 Rheumatoid arthritis, unspecified: Secondary | ICD-10-CM | POA: Diagnosis not present

## 2016-10-26 DIAGNOSIS — J019 Acute sinusitis, unspecified: Secondary | ICD-10-CM

## 2016-10-26 DIAGNOSIS — R7302 Impaired glucose tolerance (oral): Secondary | ICD-10-CM

## 2016-10-26 DIAGNOSIS — M150 Primary generalized (osteo)arthritis: Secondary | ICD-10-CM | POA: Diagnosis not present

## 2016-10-26 DIAGNOSIS — Z79899 Other long term (current) drug therapy: Secondary | ICD-10-CM | POA: Diagnosis not present

## 2016-10-26 DIAGNOSIS — M0609 Rheumatoid arthritis without rheumatoid factor, multiple sites: Secondary | ICD-10-CM | POA: Diagnosis not present

## 2016-10-26 DIAGNOSIS — M2550 Pain in unspecified joint: Secondary | ICD-10-CM | POA: Diagnosis not present

## 2016-10-26 MED ORDER — CEPHALEXIN 500 MG PO CAPS: 500.0000 mg | ORAL_CAPSULE | Freq: Three times a day (TID) | ORAL | 0 refills | Status: AC

## 2016-10-26 MED ORDER — HYDROCODONE-HOMATROPINE 5-1.5 MG/5ML PO SYRP: 5.0000 mL | ORAL_SOLUTION | Freq: Four times a day (QID) | ORAL | 0 refills | Status: AC | PRN

## 2016-10-26 NOTE — Assessment & Plan Note (Signed)
Mild to mod, for antibx course,  to f/u any worsening symptoms or concerns 

## 2016-10-26 NOTE — Assessment & Plan Note (Signed)
stable overall by history and exam, recent data reviewed with pt, and pt to continue medical treatment as before,  to f/u any worsening symptoms or concerns BP Readings from Last 3 Encounters:  10/26/16 136/70  09/30/16 (!) 142/64  05/04/16 130/72

## 2016-10-26 NOTE — Progress Notes (Signed)
Pre visit review using our clinic review tool, if applicable. No additional management support is needed unless otherwise documented below in the visit note. 
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
## 2016-10-26 NOTE — Assessment & Plan Note (Signed)
stable overall by history and exam, recent data reviewed with pt, and pt to continue medical treatment as before,  to f/u any worsening symptoms or concerns Lab Results  Component Value Date   HGBA1C 5.3 05/04/2016   

## 2016-10-26 NOTE — Patient Instructions (Signed)
Please take all new medication as prescribed - the antibiotic, and cough medicine if needed  Please continue all other medications as before, except to hold the Xeljans until the antibiotic is done  Please have the pharmacy call with any other refills you may need.  Please keep your appointments with your specialists as you may have planned

## 2016-10-26 NOTE — Assessment & Plan Note (Signed)
To hold the xelgans for time on antibx, then restart
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
## 2016-10-31 ENCOUNTER — Ambulatory Visit (INDEPENDENT_AMBULATORY_CARE_PROVIDER_SITE_OTHER): Payer: Medicare HMO | Admitting: Family Medicine

## 2016-10-31 ENCOUNTER — Encounter: Payer: Self-pay | Admitting: Family Medicine

## 2016-10-31 VITALS — BP 132/82 | HR 82 | Ht 65.0 in | Wt 173.2 lb

## 2016-10-31 DIAGNOSIS — M25551 Pain in right hip: Secondary | ICD-10-CM

## 2016-10-31 MED ORDER — GABAPENTIN 100 MG PO CAPS: 200.0000 mg | ORAL_CAPSULE | Freq: Every day | ORAL | 3 refills | Status: DC

## 2016-10-31 NOTE — Assessment & Plan Note (Signed)
Patient's right hip pain seems to be multifactorial. Mild neuritic changes of the joint noted on x-ray previously. Patient does have full range of motion but does have cogwheeling and significant tightness of the musculature. There is a potential for a movement disorder such as Parkinson's. Patient has seen a neurologist previously for this. Encourage her to follow-up again to see if anything is change since her last exam. Looking through a note to seem to be in 2014. We discussed icing regimen. Discussed core strengthening. We discussed stability. Patient though will follow-up after 4 weeks of physical therapy. Worsening symptoms we'll consider injections.

## 2016-10-31 NOTE — Patient Instructions (Addendum)
Good to see you  Ice 20 minutes 2 times daily. Usually after activity and before bed. Exercises 3 times a week.  pennsaid pinkie amount topically 2 times daily as needed.  Vitamin D 2000 IU daily  Turmeric 500mg  daily  Maybe we should see Dr. Carles Collet again at some point and you can tell me when you are ready and I will refer you  See me again in 3-4 weeks and we will consider an injection to help [place] figure it out if we have not had improvement.

## 2016-11-01 ENCOUNTER — Other Ambulatory Visit: Payer: Self-pay | Admitting: Internal Medicine

## 2016-11-04 ENCOUNTER — Ambulatory Visit: Payer: Medicare Other | Admitting: Internal Medicine

## 2016-11-08 ENCOUNTER — Ambulatory Visit (INDEPENDENT_AMBULATORY_CARE_PROVIDER_SITE_OTHER): Payer: Medicare HMO | Admitting: Internal Medicine

## 2016-11-08 ENCOUNTER — Other Ambulatory Visit (INDEPENDENT_AMBULATORY_CARE_PROVIDER_SITE_OTHER): Payer: Medicare HMO

## 2016-11-08 ENCOUNTER — Encounter: Payer: Self-pay | Admitting: Internal Medicine

## 2016-11-08 VITALS — BP 130/72 | HR 85 | Resp 20 | Wt 178.0 lb

## 2016-11-08 DIAGNOSIS — R7302 Impaired glucose tolerance (oral): Secondary | ICD-10-CM

## 2016-11-08 DIAGNOSIS — I10 Essential (primary) hypertension: Secondary | ICD-10-CM

## 2016-11-08 DIAGNOSIS — M069 Rheumatoid arthritis, unspecified: Secondary | ICD-10-CM

## 2016-11-08 DIAGNOSIS — Z0001 Encounter for general adult medical examination with abnormal findings: Secondary | ICD-10-CM

## 2016-11-08 LAB — CBC WITH DIFFERENTIAL/PLATELET
BASOS PCT: 0.5 % (ref 0.0–3.0)
Basophils Absolute: 0 10*3/uL (ref 0.0–0.1)
EOS ABS: 0.2 10*3/uL (ref 0.0–0.7)
EOS PCT: 2.2 % (ref 0.0–5.0)
HCT: 34.1 % — ABNORMAL LOW (ref 36.0–46.0)
HEMOGLOBIN: 11.6 g/dL — AB (ref 12.0–15.0)
LYMPHS ABS: 1.7 10*3/uL (ref 0.7–4.0)
Lymphocytes Relative: 21.6 % (ref 12.0–46.0)
MCHC: 34 g/dL (ref 30.0–36.0)
MCV: 96.3 fl (ref 78.0–100.0)
MONO ABS: 0.8 10*3/uL (ref 0.1–1.0)
Monocytes Relative: 9.8 % (ref 3.0–12.0)
NEUTROS PCT: 65.9 % (ref 43.0–77.0)
Neutro Abs: 5.1 10*3/uL (ref 1.4–7.7)
Platelets: 316 10*3/uL (ref 150.0–400.0)
RBC: 3.54 Mil/uL — AB (ref 3.87–5.11)
RDW: 14.1 % (ref 11.5–15.5)
WBC: 7.7 10*3/uL (ref 4.0–10.5)

## 2016-11-08 LAB — LIPID PANEL
CHOLESTEROL: 119 mg/dL (ref 0–200)
HDL: 39.4 mg/dL (ref >39.00)
LDL Cholesterol: 58 mg/dL (ref 0–99)
NonHDL: 79.89
TRIGLYCERIDES: 107 mg/dL (ref 0.0–149.0)
Total CHOL/HDL Ratio: 3
VLDL: 21.4 mg/dL (ref 0.0–40.0)

## 2016-11-08 LAB — BASIC METABOLIC PANEL
BUN: 18 mg/dL (ref 6–23)
CHLORIDE: 103 meq/L (ref 96–112)
CO2: 29 meq/L (ref 19–32)
Calcium: 9.2 mg/dL (ref 8.4–10.5)
Creatinine, Ser: 0.91 mg/dL (ref 0.40–1.20)
GFR: 64.68 mL/min (ref >60.00)
GLUCOSE: 115 mg/dL — AB (ref 70–99)
POTASSIUM: 3.5 meq/L (ref 3.5–5.1)
SODIUM: 138 meq/L (ref 135–145)

## 2016-11-08 LAB — HEPATIC FUNCTION PANEL
ALBUMIN: 4.1 g/dL (ref 3.5–5.2)
ALK PHOS: 67 U/L (ref 39–117)
ALT: 16 U/L (ref 0–35)
AST: 18 U/L (ref 0–37)
Bilirubin, Direct: 0.1 mg/dL (ref 0.0–0.3)
Total Bilirubin: 0.3 mg/dL (ref 0.2–1.2)
Total Protein: 7.2 g/dL (ref 6.0–8.3)

## 2016-11-08 LAB — URINALYSIS, ROUTINE W REFLEX MICROSCOPIC
Bilirubin Urine: NEGATIVE
Hgb urine dipstick: NEGATIVE
KETONES UR: NEGATIVE
Leukocytes, UA: NEGATIVE
Nitrite: NEGATIVE
RBC / HPF: NONE SEEN (ref 0–?)
TOTAL PROTEIN, URINE-UPE24: NEGATIVE
URINE GLUCOSE: NEGATIVE
Urobilinogen, UA: 0.2 (ref 0.0–1.0)
WBC, UA: NONE SEEN (ref 0–?)
pH: 6 (ref 5.0–8.0)

## 2016-11-08 LAB — TSH: TSH: 4.96 u[IU]/mL — AB (ref 0.35–4.50)

## 2016-11-08 LAB — C-REACTIVE PROTEIN: CRP: 0.3 mg/dL — ABNORMAL LOW (ref 0.5–20.0)

## 2016-11-08 LAB — SEDIMENTATION RATE: Sed Rate: 29 mm/hr (ref 0–30)

## 2016-11-08 MED ORDER — PANTOPRAZOLE SODIUM 40 MG PO TBEC: 40.0000 mg | DELAYED_RELEASE_TABLET | Freq: Every day | ORAL | 3 refills | Status: DC

## 2016-11-08 MED ORDER — PREDNISONE 10 MG PO TABS: ORAL_TABLET | ORAL | 0 refills | Status: DC

## 2016-11-08 NOTE — Progress Notes (Signed)
Pre visit review using our clinic review tool, if applicable. No additional management support is needed unless otherwise documented below in the visit note. 

## 2016-11-08 NOTE — Patient Instructions (Signed)
You had the steroid shot today  Please take all new medication as prescribed - the prednisone  Please continue all other medications as before, and refills have been done if requested.  Please have the pharmacy call with any other refills you may need.  Please continue your efforts at being more active, low cholesterol diet, and weight control.  You are otherwise up to date with prevention measures today.  Please keep your appointments with your specialists as you may have planned  Please go to the LAB in the Basement (turn left off the elevator) for the tests to be done today  You will be contacted by phone if any changes need to be made immediately.  Otherwise, you will receive a letter about your results with an explanation, but please check with MyChart first.  Please remember to sign up for MyChart if you have not done so, as this will be important to you in the future with finding out test results, communicating by private email, and scheduling acute appointments online when needed.  Please return in 6 months, or sooner if needed
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
## 2016-11-14 NOTE — Assessment & Plan Note (Signed)

## 2016-11-14 NOTE — Assessment & Plan Note (Addendum)
Mild to mod, for depomedrol IM 80, predpac asd,  to f/u any worsening symptoms or concerns  In addition to the time spent performing CPE, I spent an additional 25 minutes face to face,in which greater than 50% of this time was spent in counseling and coordination of care for patient's acute illness as documented.

## 2016-11-14 NOTE — Assessment & Plan Note (Signed)
stable overall by history and exam, recent data reviewed with pt, and pt to continue medical treatment as before,  to f/u any worsening symptoms or concerns BP Readings from Last 3 Encounters:  11/08/16 130/72  10/31/16 132/82  10/26/16 136/70

## 2016-11-14 NOTE — Assessment & Plan Note (Signed)
stable overall by history and exam, recent data reviewed with pt, and pt to continue medical treatment as before,  to f/u any worsening symptoms or concerns Lab Results  Component Value Date   HGBA1C 5.3 05/04/2016   Pt to call for onset polys or cbg > 200 with steroid tx

## 2016-11-15 ENCOUNTER — Ambulatory Visit: Payer: Medicare HMO | Admitting: Physical Therapy
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
## 2016-11-21 ENCOUNTER — Ambulatory Visit (INDEPENDENT_AMBULATORY_CARE_PROVIDER_SITE_OTHER): Payer: Medicare HMO | Admitting: Family Medicine

## 2016-11-21 ENCOUNTER — Encounter: Payer: Self-pay | Admitting: Family Medicine

## 2016-11-21 ENCOUNTER — Ambulatory Visit: Payer: Self-pay

## 2016-11-21 VITALS — BP 138/86 | HR 83 | Ht 65.0 in | Wt 173.0 lb

## 2016-11-21 DIAGNOSIS — M7061 Trochanteric bursitis, right hip: Secondary | ICD-10-CM

## 2016-11-21 DIAGNOSIS — M25551 Pain in right hip: Secondary | ICD-10-CM

## 2016-11-21 NOTE — Assessment & Plan Note (Signed)
Patient did have injection given today. Neer complete resolution of pain. Hopefully she'll do well with conservative therapy. States that she has home exercises encouraged her to do it. Patient will start going to formal physical therapy because she is been discharged previously. We discussed icing regimen. Discussed topical anti-inflammatories. Follow-up again for 6 weeks.

## 2016-11-21 NOTE — Patient Instructions (Addendum)
Good to see you  We injected the side of your hip and hope it helps.  Ice is your friend especially in 6 hours.  Stay active and start the exercises you were given again in 2 days and do them 3 times a week.  See me again in 4-6 weeks.

## 2016-11-22 ENCOUNTER — Telehealth: Payer: Self-pay | Admitting: Family Medicine

## 2016-11-22 ENCOUNTER — Telehealth: Payer: Self-pay | Admitting: Physical Therapy

## 2016-11-22 NOTE — Telephone Encounter (Signed)
Patient states she can not afford PT.  Patient states she will do exercises at home.

## 2016-11-22 NOTE — Telephone Encounter (Signed)
11/22/16 patient cxl PT eval due to financial reasons

## 2016-11-24 ENCOUNTER — Ambulatory Visit: Payer: Medicare HMO | Admitting: Physical Therapy

## 2016-12-20 ENCOUNTER — Encounter: Payer: Self-pay | Admitting: Internal Medicine

## 2016-12-20 ENCOUNTER — Other Ambulatory Visit (INDEPENDENT_AMBULATORY_CARE_PROVIDER_SITE_OTHER): Payer: Medicare HMO

## 2016-12-20 ENCOUNTER — Ambulatory Visit (INDEPENDENT_AMBULATORY_CARE_PROVIDER_SITE_OTHER): Payer: Medicare HMO | Admitting: Internal Medicine

## 2016-12-20 VITALS — BP 132/82 | HR 86 | Temp 98.1°F | Ht 65.0 in | Wt 176.0 lb

## 2016-12-20 DIAGNOSIS — J019 Acute sinusitis, unspecified: Secondary | ICD-10-CM | POA: Diagnosis not present

## 2016-12-20 DIAGNOSIS — R300 Dysuria: Secondary | ICD-10-CM | POA: Diagnosis not present

## 2016-12-20 DIAGNOSIS — M069 Rheumatoid arthritis, unspecified: Secondary | ICD-10-CM | POA: Diagnosis not present

## 2016-12-20 DIAGNOSIS — M545 Low back pain, unspecified: Secondary | ICD-10-CM

## 2016-12-20 DIAGNOSIS — J069 Acute upper respiratory infection, unspecified: Secondary | ICD-10-CM | POA: Insufficient documentation

## 2016-12-20 LAB — URINALYSIS, ROUTINE W REFLEX MICROSCOPIC
Bilirubin Urine: NEGATIVE
Hgb urine dipstick: NEGATIVE
Ketones, ur: NEGATIVE
Nitrite: NEGATIVE
RBC / HPF: NONE SEEN (ref 0–?)
Total Protein, Urine: NEGATIVE
URINE GLUCOSE: NEGATIVE
UROBILINOGEN UA: 0.2 (ref 0.0–1.0)
pH: 6 (ref 5.0–8.0)

## 2016-12-20 MED ORDER — CEPHALEXIN 500 MG PO CAPS: 500.0000 mg | ORAL_CAPSULE | Freq: Three times a day (TID) | ORAL | 0 refills | Status: AC

## 2016-12-20 NOTE — Patient Instructions (Signed)
Please take all new medication as prescribed - the antibiotic  Ok to hold off on this weeks Humira dose  Please continue all other medications as before, and refills have been done if requested.  Please have the pharmacy call with any other refills you may need.  Please keep your appointments with your specialists as you may have planned  The urine testing will be done as well but will take 2-3 days.

## 2016-12-22 ENCOUNTER — Telehealth: Payer: Self-pay | Admitting: Internal Medicine

## 2016-12-22 NOTE — Telephone Encounter (Signed)
Patient called in about test results. I informed her of the letter and read her the results. She did not seems to understand. Can the nurse please follow up with her. Thank you.

## 2016-12-22 NOTE — Telephone Encounter (Signed)
Called pt about lab result--normal

## 2016-12-23 LAB — URINE CULTURE

## 2016-12-25 NOTE — Assessment & Plan Note (Signed)
C/w msk strain, not clear will get better with UTI tx, d/w pt - for tylenol prn

## 2016-12-25 NOTE — Assessment & Plan Note (Signed)
?   UTi - for urine studies, antibx as above

## 2016-12-25 NOTE — Assessment & Plan Note (Signed)
Ok to resume humira next wk if improved
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
## 2016-12-25 NOTE — Assessment & Plan Note (Signed)
Mild to mod, for antibx course,  to f/u any worsening symptoms or concerns 
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
## 2016-12-26 ENCOUNTER — Ambulatory Visit: Payer: Medicare HMO | Admitting: Family Medicine

## 2017-01-04 ENCOUNTER — Ambulatory Visit: Payer: Medicare HMO | Admitting: Family Medicine

## 2017-01-18 ENCOUNTER — Encounter: Payer: Self-pay | Admitting: Internal Medicine

## 2017-01-18 ENCOUNTER — Ambulatory Visit (INDEPENDENT_AMBULATORY_CARE_PROVIDER_SITE_OTHER): Payer: Medicare HMO | Admitting: Internal Medicine

## 2017-01-18 VITALS — BP 144/90 | HR 72 | Temp 97.8°F | Ht 65.0 in | Wt 174.0 lb

## 2017-01-18 DIAGNOSIS — I495 Sick sinus syndrome: Secondary | ICD-10-CM | POA: Diagnosis not present

## 2017-01-18 DIAGNOSIS — I10 Essential (primary) hypertension: Secondary | ICD-10-CM | POA: Diagnosis not present

## 2017-01-18 DIAGNOSIS — L719 Rosacea, unspecified: Secondary | ICD-10-CM | POA: Diagnosis not present

## 2017-01-18 DIAGNOSIS — J309 Allergic rhinitis, unspecified: Secondary | ICD-10-CM | POA: Diagnosis not present

## 2017-01-18 HISTORY — DX: Rosacea, unspecified: L71.9

## 2017-01-18 MED ORDER — GABAPENTIN 100 MG PO CAPS: 200.0000 mg | ORAL_CAPSULE | Freq: Every day | ORAL | 5 refills | Status: DC

## 2017-01-18 MED ORDER — FEXOFENADINE HCL 180 MG PO TABS: 180.0000 mg | ORAL_TABLET | Freq: Every day | ORAL | 3 refills | Status: DC

## 2017-01-18 MED ORDER — METRONIDAZOLE 0.75 % EX CREA: TOPICAL_CREAM | Freq: Two times a day (BID) | CUTANEOUS | 1 refills | Status: DC

## 2017-01-18 NOTE — Patient Instructions (Addendum)
Please take all new medication as prescribed - the topical medication (flagyl) for the rosacea, and the allegra  Please continue all other medications as before, and refills have been done if requested, including the gabapentin  Please have the pharmacy call with any other refills you may need.  Please continue your efforts at being more active, low cholesterol diet, and weight control.  Please keep your appointments with your specialists as you may have planned
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
## 2017-01-18 NOTE — Progress Notes (Signed)
Pre visit review using our clinic review tool, if applicable. No additional management support is needed unless otherwise documented below in the visit note. 

## 2017-01-19 ENCOUNTER — Telehealth: Payer: Self-pay | Admitting: Internal Medicine

## 2017-01-19 DIAGNOSIS — I495 Sick sinus syndrome: Secondary | ICD-10-CM

## 2017-01-19 NOTE — Telephone Encounter (Signed)
Ok, this is done 

## 2017-01-19 NOTE — Telephone Encounter (Signed)
Patient is requesting referral to [place] at [facility] for raid heart rate.

## 2017-01-22 NOTE — Assessment & Plan Note (Signed)
Mild, for topical flagyl,  to f/u any worsening symptoms or concerns

## 2017-01-22 NOTE — Assessment & Plan Note (Signed)
D/w pt, cont same tx, f/u cardiology

## 2017-01-22 NOTE — Assessment & Plan Note (Signed)
Mild, for allegra asd,  to f/u any worsening symptoms or concerns

## 2017-01-22 NOTE — Assessment & Plan Note (Signed)
stable overall by history and exam, recent data reviewed with pt, and pt to continue medical treatment as before,  to f/u any worsening symptoms or concerns BP Readings from Last 3 Encounters:  01/18/17 (!) 144/90  12/20/16 132/82  11/21/16 138/86

## 2017-01-25 DIAGNOSIS — M150 Primary generalized (osteo)arthritis: Secondary | ICD-10-CM | POA: Diagnosis not present

## 2017-01-25 DIAGNOSIS — M0609 Rheumatoid arthritis without rheumatoid factor, multiple sites: Secondary | ICD-10-CM | POA: Diagnosis not present

## 2017-01-25 DIAGNOSIS — Z79899 Other long term (current) drug therapy: Secondary | ICD-10-CM | POA: Diagnosis not present

## 2017-01-25 DIAGNOSIS — M2550 Pain in unspecified joint: Secondary | ICD-10-CM | POA: Diagnosis not present

## 2017-02-02 ENCOUNTER — Encounter: Payer: Self-pay | Admitting: Cardiology

## 2017-02-02 ENCOUNTER — Ambulatory Visit (INDEPENDENT_AMBULATORY_CARE_PROVIDER_SITE_OTHER): Payer: Medicare HMO | Admitting: Cardiology

## 2017-02-02 VITALS — BP 140/84 | HR 71 | Ht 65.0 in | Wt 174.2 lb

## 2017-02-02 DIAGNOSIS — R0602 Shortness of breath: Secondary | ICD-10-CM | POA: Diagnosis not present

## 2017-02-02 DIAGNOSIS — R002 Palpitations: Secondary | ICD-10-CM
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
## 2017-02-02 NOTE — Patient Instructions (Signed)
Medication Instructions: Your physician recommends that you continue on your current medications as directed. Please refer to the Current Medication list given to you today.   Labwork: None ordered  Procedures/Testing: Your physician has requested that you have an echocardiogram. Echocardiography is a painless test that uses sound waves to create images of your heart. It provides your doctor with information about the size and shape of your heart and how well your heart's chambers and valves are working. This procedure takes approximately one hour. There are no restrictions for this procedure.    Follow-Up: Your physician recommends that you schedule a follow-up appointment in: 3 months with Dr. Camnitz.    Any Additional Special Instructions Will Be Listed Below (If Applicable).  Echocardiogram An echocardiogram, or echocardiography, uses sound waves (ultrasound) to produce an image of your heart. The echocardiogram is simple, painless, obtained within a short period of time, and offers valuable information to your health care provider. The images from an echocardiogram can provide information such as:  Evidence of coronary artery disease (CAD).  Heart size.  Heart muscle function.  Heart valve function.  Aneurysm detection.  Evidence of a past heart attack.  Fluid buildup around the heart.  Heart muscle thickening.  Assess heart valve function.  Tell a health care provider about:  Any allergies you have.  All medicines you are taking, including vitamins, herbs, eye drops, creams, and over-the-counter medicines.  Any problems you or family members have had with anesthetic medicines.  Any blood disorders you have.  Any surgeries you have had.  Any medical conditions you have.  Whether you are pregnant or may be pregnant. What happens before the procedure? No special preparation is needed. Eat and drink normally. What happens during the procedure?  In order to  produce an image of your heart, gel will be applied to your chest and a wand-like tool (transducer) will be moved over your chest. The gel will help transmit the sound waves from the transducer. The sound waves will harmlessly bounce off your heart to allow the heart images to be captured in real-time motion. These images will then be recorded.  You may need an IV to receive a medicine that improves the quality of the pictures. What happens after the procedure? You may return to your normal schedule including diet, activities, and medicines, unless your health care provider tells you otherwise. This information is not intended to replace advice given to you by your health care provider. Make sure you discuss any questions you have with your health care provider. Document Released: 09/30/2000 Document Revised: 05/21/2016 Document Reviewed: 06/10/2013 Elsevier Interactive Patient Education  2017 Elsevier Inc.    If you need a refill on your cardiac medications before your next appointment, please call your pharmacy.   

## 2017-02-03 ENCOUNTER — Other Ambulatory Visit: Payer: Self-pay | Admitting: Internal Medicine

## 2017-02-16 ENCOUNTER — Other Ambulatory Visit: Payer: Self-pay

## 2017-02-16 ENCOUNTER — Ambulatory Visit (HOSPITAL_COMMUNITY): Payer: Medicare HMO | Attending: Cardiology

## 2017-02-16 DIAGNOSIS — I351 Nonrheumatic aortic (valve) insufficiency: Secondary | ICD-10-CM | POA: Diagnosis not present

## 2017-02-16 DIAGNOSIS — I4891 Unspecified atrial fibrillation: Secondary | ICD-10-CM | POA: Insufficient documentation

## 2017-02-16 DIAGNOSIS — I495 Sick sinus syndrome: Secondary | ICD-10-CM | POA: Diagnosis not present

## 2017-02-16 DIAGNOSIS — Z86718 Personal history of other venous thrombosis and embolism: Secondary | ICD-10-CM | POA: Insufficient documentation

## 2017-02-16 DIAGNOSIS — E785 Hyperlipidemia, unspecified: Secondary | ICD-10-CM | POA: Diagnosis not present

## 2017-02-16 DIAGNOSIS — I071 Rheumatic tricuspid insufficiency: Secondary | ICD-10-CM | POA: Insufficient documentation

## 2017-02-16 DIAGNOSIS — I10 Essential (primary) hypertension: Secondary | ICD-10-CM | POA: Insufficient documentation

## 2017-02-16 DIAGNOSIS — R002 Palpitations: Secondary | ICD-10-CM | POA: Diagnosis not present

## 2017-02-16 DIAGNOSIS — R0602 Shortness of breath: Secondary | ICD-10-CM | POA: Diagnosis not present

## 2017-02-16 DIAGNOSIS — I340 Nonrheumatic mitral (valve) insufficiency: Secondary | ICD-10-CM | POA: Diagnosis not present

## 2017-02-18 DIAGNOSIS — R42 Dizziness and giddiness: Secondary | ICD-10-CM | POA: Diagnosis not present

## 2017-02-18 DIAGNOSIS — R609 Edema, unspecified: Secondary | ICD-10-CM | POA: Diagnosis not present

## 2017-02-18 DIAGNOSIS — H9311 Tinnitus, right ear: Secondary | ICD-10-CM | POA: Diagnosis not present

## 2017-02-18 DIAGNOSIS — M0579 Rheumatoid arthritis with rheumatoid factor of multiple sites without organ or systems involvement: Secondary | ICD-10-CM | POA: Diagnosis not present

## 2017-02-18 DIAGNOSIS — Z0000 Encounter for general adult medical examination without abnormal findings: Secondary | ICD-10-CM | POA: Diagnosis not present

## 2017-02-18 DIAGNOSIS — H3530 Unspecified macular degeneration: Secondary | ICD-10-CM | POA: Diagnosis not present

## 2017-02-18 DIAGNOSIS — Z6829 Body mass index (BMI) 29.0-29.9, adult: Secondary | ICD-10-CM | POA: Diagnosis not present

## 2017-02-18 DIAGNOSIS — I4892 Unspecified atrial flutter: Secondary | ICD-10-CM | POA: Diagnosis not present

## 2017-02-18 DIAGNOSIS — K219 Gastro-esophageal reflux disease without esophagitis: Secondary | ICD-10-CM | POA: Diagnosis not present

## 2017-02-18 DIAGNOSIS — E785 Hyperlipidemia, unspecified: Secondary | ICD-10-CM | POA: Diagnosis not present

## 2017-02-18 DIAGNOSIS — I10 Essential (primary) hypertension: Secondary | ICD-10-CM | POA: Diagnosis not present

## 2017-02-18 DIAGNOSIS — G43909 Migraine, unspecified, not intractable, without status migrainosus: Secondary | ICD-10-CM | POA: Diagnosis not present

## 2017-02-18 DIAGNOSIS — R251 Tremor, unspecified: Secondary | ICD-10-CM | POA: Diagnosis not present

## 2017-02-18 DIAGNOSIS — Z79899 Other long term (current) drug therapy: Secondary | ICD-10-CM | POA: Diagnosis not present

## 2017-02-18 DIAGNOSIS — I4891 Unspecified atrial fibrillation: Secondary | ICD-10-CM | POA: Diagnosis not present

## 2017-02-18 DIAGNOSIS — Z7982 Long term (current) use of aspirin: Secondary | ICD-10-CM | POA: Diagnosis not present

## 2017-03-16 ENCOUNTER — Ambulatory Visit (INDEPENDENT_AMBULATORY_CARE_PROVIDER_SITE_OTHER): Payer: Medicare HMO | Admitting: Ophthalmology

## 2017-03-16 DIAGNOSIS — H43813 Vitreous degeneration, bilateral: Secondary | ICD-10-CM

## 2017-03-16 DIAGNOSIS — I10 Essential (primary) hypertension: Secondary | ICD-10-CM

## 2017-03-16 DIAGNOSIS — H35033 Hypertensive retinopathy, bilateral: Secondary | ICD-10-CM

## 2017-03-16 DIAGNOSIS — H353132 Nonexudative age-related macular degeneration, bilateral, intermediate dry stage: Secondary | ICD-10-CM | POA: Diagnosis not present

## 2017-03-16 DIAGNOSIS — M069 Rheumatoid arthritis, unspecified: Secondary | ICD-10-CM

## 2017-03-24 ENCOUNTER — Ambulatory Visit (INDEPENDENT_AMBULATORY_CARE_PROVIDER_SITE_OTHER): Payer: Medicare HMO | Admitting: Family

## 2017-03-24 ENCOUNTER — Encounter: Payer: Self-pay | Admitting: Family

## 2017-03-24 ENCOUNTER — Other Ambulatory Visit: Payer: Medicare HMO

## 2017-03-24 VITALS — BP 136/82 | HR 77 | Temp 98.8°F | Resp 14 | Ht 65.0 in | Wt 175.0 lb

## 2017-03-24 DIAGNOSIS — R8290 Unspecified abnormal findings in urine: Secondary | ICD-10-CM | POA: Diagnosis not present

## 2017-03-24 LAB — POCT URINALYSIS DIPSTICK
BILIRUBIN UA: NEGATIVE
Blood, UA: NEGATIVE
GLUCOSE UA: NEGATIVE
KETONES UA: NEGATIVE
NITRITE UA: NEGATIVE
Protein, UA: NEGATIVE
Spec Grav, UA: 1.02 (ref 1.010–1.025)
Urobilinogen, UA: NEGATIVE E.U./dL — AB
pH, UA: 6 (ref 5.0–8.0)

## 2017-03-24 MED ORDER — NITROFURANTOIN MONOHYD MACRO 100 MG PO CAPS: 100.0000 mg | ORAL_CAPSULE | Freq: Two times a day (BID) | ORAL | 0 refills | Status: DC

## 2017-03-24 NOTE — Assessment & Plan Note (Signed)
In office urinalysis positive for leukocytes and negative for nitrites and hematuria. Symptoms consistent with urinary tract infection. Urine will be sent for culture for confirmation. Start Macrobid. Encouraged to drink plenty of fluids and cranberry juice as needed. Follow-up if symptoms worsen or do not improve.

## 2017-03-24 NOTE — Progress Notes (Signed)
Subjective:    Patient ID: [name], female    DOB: [birth year], 71 y.o.   MRN: [id number]  Chief Complaint  Patient presents with  . Abdominal discomfort    states she is having some abdominal discomfort, not able to urinate as often as she usually does, feels like she is retaining fluid     HPI:  [name] is a 71 y.o. 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 and presents today for an acute office visit.  This is a new problem. Associated symptoms of lower abdominal pain, urgency, and occasional frequency has been going on for about 2-3 days. Had chills last night with no measurable fever. Drinks water all the time.    Allergies  Allergen Reactions  . Alendronate Sodium     Per pt: unknown  . Atenolol     Increased BP  . Ciprofloxacin Other (See Comments)    Tongue and lip swelling  . Codeine Nausea And Vomiting  . Cortisone     Glucocorticoids specifically: causes swelling   . Doxycycline     Per pt: unknown  . Klonopin [Clonazepam]     Urinary retention  . Lisinopril     05/07/14 lower lip paresthesia  . Mineral Oil     Per pt: unknown  . Pravastatin     Per pt: unknown  . Prednisone     swelling  . Ramipril     Increases BP  . Sulfa Antibiotics     swelling  . Tizanidine Other (See Comments)    Dizziness   . Verapamil     bradycardia      Outpatient  Medications Prior to Visit  Medication Sig Dispense Refill  . albuterol (PROVENTIL HFA;VENTOLIN HFA) 108 (90 Base) MCG/ACT inhaler Inhale 2 puffs into the lungs every 6 (six) hours as needed for wheezing or shortness of breath. 1 Inhaler 1  . aspirin 81 MG tablet Take 81 mg by mouth 2 (two) times daily.    . Cholecalciferol (VITAMIN D3) 5000 UNITS CAPS Take 5,000 Units by mouth daily.     . clonazePAM (KLONOPIN) 1 MG tablet Take 1 mg by mouth at bedtime. As needed sleep    . cyanocobalamin 100 MCG tablet Take 100 mcg by mouth daily.    . fexofenadine (ALLEGRA) 180 MG tablet Take 1 tablet (180 mg total) by mouth daily. 90 tablet 3  . folic acid (FOLVITE) 1 MG tablet Take 2 mg by mouth daily.     Marland Kitchen gabapentin (NEURONTIN) 100 MG capsule Take 2 capsules (200 mg total) by mouth at bedtime. 60 capsule 5  . hydrochlorothiazide (MICROZIDE) 12.5 MG capsule TAKE ONE CAPSULE BY MOUTH EVERY DAY 30 capsule 5  . HYDROcodone-acetaminophen (NORCO/VICODIN) 5-325 MG tablet Take 1-2 tablets by mouth every 4 (four) hours as needed for moderate pain or severe pain. 20 tablet 0  . methotrexate (RHEUMATREX) 2.5 MG tablet Take 10 mg by mouth once a week.     . metoprolol (LOPRESSOR) 50 MG tablet Take 1 tablet (50 mg total) by mouth 2 (two) times daily. 180 tablet 1  .  Multiple Vitamins-Minerals (CENTRUM SILVER PO) Take by mouth daily.    . pantoprazole (PROTONIX) 40 MG tablet Take 1 tablet (40 mg total) by mouth daily. 90 tablet 3  . pravastatin (PRAVACHOL) 40 MG tablet TAKE 1 TABLET BY MOUTH EVERY DAY 90 tablet 2  . Tofacitinib Citrate (XELJANZ XR) 11 MG TB24 Take by mouth.    . topiramate (TOPAMAX) 50 MG tablet TAKE 1 TABLET(50 MG) BY MOUTH TWICE DAILY 180 tablet 0  . topiramate (TOPAMAX) 50 MG tablet TAKE 1 TABLET (50 MG TOTAL) BY MOUTH 2 (TWO) TIMES DAILY. 180 tablet 0  . traMADol (ULTRAM) 50 MG tablet Take 1 tablet (50 mg total) by mouth every 8 (eight) hours as needed. 60 tablet 2   No facility-administered  medications prior to visit.      Review of Systems  Constitutional: Positive for chills. Negative for fever.  Genitourinary: Positive for frequency and urgency. Negative for dysuria and hematuria.      Objective:    BP 136/82 (BP Location: Left Arm, Patient Position: Sitting, Cuff Size: Large)   Pulse 77   Temp 98.8 F (37.1 C) (Oral)   Resp 14   Ht 5\' 5"  (1.651 m)   Wt 175 lb (79.4 kg)   SpO2 96%   BMI 29.12 kg/m  Nursing note and vital signs reviewed.  Physical Exam  Constitutional: She is oriented to person, place, and time. She appears well-developed and well-nourished. No distress.  Cardiovascular: Normal rate, regular rhythm and intact distal pulses.  Exam reveals no gallop and no friction rub.   No murmur heard. Pulmonary/Chest: Effort normal and breath sounds normal. No respiratory distress. She has no wheezes. She has no rales. She exhibits no tenderness.  Abdominal: There is no CVA tenderness.  Neurological: She is alert and oriented to person, place, and time.  Skin: Skin is warm and dry.  Psychiatric: She has a normal mood and affect. Her behavior is normal. Judgment and thought content normal.       Assessment & Plan:   Problem List Items Addressed This Visit      Other   Abnormal urine odor - Primary    In office urinalysis positive for leukocytes and negative for nitrites and hematuria. Symptoms consistent with urinary tract infection. Urine will be sent for culture for confirmation. Start Macrobid. Encouraged to drink plenty of fluids and cranberry juice as needed. Follow-up if symptoms worsen or do not improve.      Relevant Orders   POCT urinalysis dipstick (Completed)   Urine Culture       I am having Ms. Maclellan start on nitrofurantoin (macrocrystal-monohydrate). I am also having her maintain her aspirin, Vitamin D3, cyanocobalamin, Multiple Vitamins-Minerals (CENTRUM SILVER PO), methotrexate, folic acid, clonazePAM, traMADol, albuterol,  Tofacitinib Citrate, HYDROcodone-acetaminophen, topiramate, metoprolol tartrate, pravastatin, hydrochlorothiazide, pantoprazole, fexofenadine, gabapentin, and topiramate.   Meds ordered this encounter  Medications  . nitrofurantoin, macrocrystal-monohydrate, (MACROBID) 100 MG capsule    Sig: Take 1 capsule (100 mg total) by mouth 2 (two) times daily.    Dispense:  14 capsule    Refill:  0    Order Specific Question:   Supervising Provider    Answer:   Pricilla Holm A [[id number]]     Follow-up: Return if symptoms worsen or fail to improve.  Mauricio Po, FNP

## 2017-03-24 NOTE — Patient Instructions (Signed)
Thank you for choosing [facility].  SUMMARY AND INSTRUCTIONS:  Please start taking Macrobid.  Follow up if your symptoms worsen or do not improve.   Your urine will be sent for culture for confirmation.   Medication:  Your prescription(s) have been submitted to your pharmacy or been printed and provided for you. Please take as directed and contact our office if you believe you are having problem(s) with the medication(s) or have any questions.   Follow up:  If your symptoms worsen or fail to improve, please contact our office for further instruction, or in case of emergency go directly to the emergency room at the closest medical facility.    Urinary Tract Infection, Adult A urinary tract infection (UTI) is an infection of any part of the urinary tract. The urinary tract includes the:  Kidneys.  Ureters.  Bladder.  Urethra.  These organs make, store, and get rid of pee (urine) in the body. Follow these instructions at home:  Take over-the-counter and prescription medicines only as told by your doctor.  If you were prescribed an antibiotic medicine, take it as told by your doctor. Do not stop taking the antibiotic even if you start to feel better.  Avoid the following drinks: ? Alcohol. ? Caffeine. ? Tea. ? Carbonated drinks.  Drink enough fluid to keep your pee clear or pale yellow.  Keep all follow-up visits as told by your doctor. This is important.  Make sure to: ? Empty your bladder often and completely. Do not to hold pee for long periods of time. ? Empty your bladder before and after sex. ? Wipe from front to back after a bowel movement if you are female. Use each tissue one time when you wipe. Contact a doctor if:  You have back pain.  You have a fever.  You feel sick to your stomach (nauseous).  You throw up (vomit).  Your symptoms do not get better after 3 days.  Your symptoms go away and then come back. Get help right away if:  You  have very bad back pain.  You have very bad lower belly (abdominal) pain.  You are throwing up and cannot keep down any medicines or water. This information is not intended to replace advice given to you by your health care provider. Make sure you discuss any questions you have with your health care provider. Document Released: 03/21/2008 Document Revised: 03/10/2016 Document Reviewed: 08/24/2015 Elsevier Interactive Patient Education  [employer].

## 2017-03-25 LAB — URINE CULTURE

## 2017-04-14 ENCOUNTER — Other Ambulatory Visit: Payer: Self-pay | Admitting: Internal Medicine

## 2017-04-30 ENCOUNTER — Other Ambulatory Visit: Payer: Self-pay | Admitting: Internal Medicine

## 2017-05-02 DIAGNOSIS — Z79899 Other long term (current) drug therapy: Secondary | ICD-10-CM | POA: Diagnosis not present

## 2017-05-02 DIAGNOSIS — M150 Primary generalized (osteo)arthritis: Secondary | ICD-10-CM | POA: Diagnosis not present

## 2017-05-02 DIAGNOSIS — M2550 Pain in unspecified joint: Secondary | ICD-10-CM | POA: Diagnosis not present

## 2017-05-02 DIAGNOSIS — M0609 Rheumatoid arthritis without rheumatoid factor, multiple sites: Secondary | ICD-10-CM | POA: Diagnosis not present

## 2017-05-08 ENCOUNTER — Ambulatory Visit: Payer: Medicare HMO | Admitting: Cardiology

## 2017-05-11 ENCOUNTER — Encounter: Payer: Self-pay | Admitting: Internal Medicine

## 2017-05-11 ENCOUNTER — Ambulatory Visit (INDEPENDENT_AMBULATORY_CARE_PROVIDER_SITE_OTHER): Payer: Medicare HMO | Admitting: Internal Medicine

## 2017-05-11 ENCOUNTER — Ambulatory Visit: Payer: Medicare HMO | Admitting: Internal Medicine

## 2017-05-11 VITALS — BP 146/90 | HR 71 | Ht 65.0 in | Wt 173.0 lb

## 2017-05-11 DIAGNOSIS — I10 Essential (primary) hypertension: Secondary | ICD-10-CM

## 2017-05-11 DIAGNOSIS — Z0000 Encounter for general adult medical examination without abnormal findings: Secondary | ICD-10-CM | POA: Diagnosis not present

## 2017-05-11 DIAGNOSIS — E785 Hyperlipidemia, unspecified: Secondary | ICD-10-CM | POA: Diagnosis not present

## 2017-05-11 DIAGNOSIS — R7302 Impaired glucose tolerance (oral): Secondary | ICD-10-CM

## 2017-05-11 NOTE — Patient Instructions (Addendum)
Please check your blood pressure once per day for 1 wk and call with results if the average is more then 140/90  Please continue all other medications as before, and refills have been done if requested.  Please have the pharmacy call with any other refills you may need.  Please continue your efforts at being more active, low cholesterol diet, and weight control.  You are otherwise up to date with prevention measures today.  Please keep your appointments with your specialists as you may have planned  Please return in 6 months, or sooner if needed, with Lab testing done 3-5 days before
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
## 2017-05-14 NOTE — Assessment & Plan Note (Signed)
stable overall by history and exam, recent data reviewed with pt, and pt to continue medical treatment as before,  to f/u any worsening symptoms or concerns  

## 2017-05-14 NOTE — Assessment & Plan Note (Signed)
stable overall by history and exam, recent data reviewed with pt, and pt to continue medical treatment as before,  to f/u any worsening symptoms or concerns Lab Results  Component Value Date   LDLCALC 58 11/08/2016

## 2017-05-14 NOTE — Assessment & Plan Note (Signed)
stable overall by history and exam, recent data reviewed with pt, and pt to continue medical treatment as before,  to f/u any worsening symptoms or concerns Lab Results  Component Value Date   HGBA1C 5.3 05/04/2016

## 2017-05-24 ENCOUNTER — Encounter: Payer: Self-pay | Admitting: Cardiology

## 2017-05-24 ENCOUNTER — Ambulatory Visit (INDEPENDENT_AMBULATORY_CARE_PROVIDER_SITE_OTHER): Payer: Medicare HMO | Admitting: Cardiology

## 2017-05-24 VITALS — BP 130/84 | HR 78 | Ht 65.0 in | Wt 171.6 lb

## 2017-05-24 DIAGNOSIS — R002 Palpitations: Secondary | ICD-10-CM

## 2017-05-24 DIAGNOSIS — R0609 Other forms of dyspnea: Secondary | ICD-10-CM

## 2017-05-24 DIAGNOSIS — I10 Essential (primary) hypertension: Secondary | ICD-10-CM | POA: Diagnosis not present

## 2017-05-24 DIAGNOSIS — R0600 Dyspnea, unspecified: Secondary | ICD-10-CM
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
## 2017-05-24 NOTE — Patient Instructions (Signed)
Your physician wants you to follow-up in: [name] will receive a reminder letter in the mail two months in advance. If you don't receive a letter, please call our office to schedule the follow-up appointment.   If you need a refill on your cardiac medications before your next appointment, please call your pharmacy.

## 2017-06-26 ENCOUNTER — Other Ambulatory Visit: Payer: Self-pay | Admitting: Internal Medicine

## 2017-07-05 ENCOUNTER — Ambulatory Visit: Payer: Medicare HMO | Admitting: Internal Medicine

## 2017-07-06 DIAGNOSIS — Z79899 Other long term (current) drug therapy: Secondary | ICD-10-CM | POA: Diagnosis not present

## 2017-07-06 DIAGNOSIS — M0609 Rheumatoid arthritis without rheumatoid factor, multiple sites: Secondary | ICD-10-CM | POA: Diagnosis not present

## 2017-07-18 ENCOUNTER — Ambulatory Visit: Payer: Medicare HMO

## 2017-07-24 ENCOUNTER — Other Ambulatory Visit: Payer: Self-pay | Admitting: Internal Medicine

## 2017-07-24 DIAGNOSIS — Z1231 Encounter for screening mammogram for malignant neoplasm of breast: Secondary | ICD-10-CM

## 2017-07-25 ENCOUNTER — Ambulatory Visit: Payer: Medicare HMO

## 2017-07-25 ENCOUNTER — Ambulatory Visit (INDEPENDENT_AMBULATORY_CARE_PROVIDER_SITE_OTHER): Payer: Medicare HMO

## 2017-07-25 DIAGNOSIS — Z23 Encounter for immunization: Secondary | ICD-10-CM | POA: Diagnosis not present

## 2017-07-27 ENCOUNTER — Other Ambulatory Visit: Payer: Self-pay | Admitting: Internal Medicine

## 2017-08-02 DIAGNOSIS — M0609 Rheumatoid arthritis without rheumatoid factor, multiple sites: Secondary | ICD-10-CM | POA: Diagnosis not present

## 2017-08-02 DIAGNOSIS — M150 Primary generalized (osteo)arthritis: Secondary | ICD-10-CM | POA: Diagnosis not present

## 2017-08-02 DIAGNOSIS — M2550 Pain in unspecified joint: Secondary | ICD-10-CM | POA: Diagnosis not present

## 2017-08-02 DIAGNOSIS — Z79899 Other long term (current) drug therapy: Secondary | ICD-10-CM | POA: Diagnosis not present

## 2017-08-13 ENCOUNTER — Other Ambulatory Visit: Payer: Self-pay | Admitting: Internal Medicine

## 2017-08-24 DIAGNOSIS — R69 Illness, unspecified: Secondary | ICD-10-CM | POA: Diagnosis not present

## 2017-09-04 ENCOUNTER — Ambulatory Visit
Admission: RE | Admit: 2017-09-04 | Discharge: 2017-09-04 | Disposition: A | Discharge status: Home / Self Care | Payer: Medicare HMO | Source: Ambulatory Visit | Attending: Internal Medicine | Admitting: Internal Medicine

## 2017-09-04 DIAGNOSIS — Z1231 Encounter for screening mammogram for malignant neoplasm of breast: Secondary | ICD-10-CM | POA: Diagnosis not present

## 2017-09-18 ENCOUNTER — Ambulatory Visit (INDEPENDENT_AMBULATORY_CARE_PROVIDER_SITE_OTHER): Payer: Medicare HMO | Admitting: Internal Medicine

## 2017-09-18 ENCOUNTER — Other Ambulatory Visit (INDEPENDENT_AMBULATORY_CARE_PROVIDER_SITE_OTHER): Payer: Medicare HMO

## 2017-09-18 ENCOUNTER — Encounter: Payer: Self-pay | Admitting: Internal Medicine

## 2017-09-18 VITALS — BP 130/80 | HR 68 | Temp 98.5°F | Ht 65.0 in | Wt 165.0 lb

## 2017-09-18 DIAGNOSIS — J0191 Acute recurrent sinusitis, unspecified: Secondary | ICD-10-CM

## 2017-09-18 DIAGNOSIS — B001 Herpesviral vesicular dermatitis: Secondary | ICD-10-CM | POA: Diagnosis not present

## 2017-09-18 DIAGNOSIS — R350 Frequency of micturition: Secondary | ICD-10-CM

## 2017-09-18 DIAGNOSIS — M069 Rheumatoid arthritis, unspecified: Secondary | ICD-10-CM

## 2017-09-18 LAB — URINALYSIS, ROUTINE W REFLEX MICROSCOPIC
BILIRUBIN URINE: NEGATIVE
HGB URINE DIPSTICK: NEGATIVE
Ketones, ur: NEGATIVE
NITRITE: NEGATIVE
RBC / HPF: NONE SEEN (ref 0–?)
Specific Gravity, Urine: 1.01 (ref 1.000–1.030)
TOTAL PROTEIN, URINE-UPE24: NEGATIVE
URINE GLUCOSE: NEGATIVE
UROBILINOGEN UA: 0.2 (ref 0.0–1.0)
pH: 6.5 (ref 5.0–8.0)

## 2017-09-18 MED ORDER — AMOXICILLIN 500 MG PO CAPS: 1000.0000 mg | ORAL_CAPSULE | Freq: Two times a day (BID) | ORAL | 0 refills | Status: DC

## 2017-09-18 NOTE — Patient Instructions (Signed)
Please take all new medication as prescribed - the antibiotic  Please continue all other medications as before, and refills have been done if requested.  Please have the pharmacy call with any other refills you may need.  Please continue your efforts at being more active, low cholesterol diet, and weight control.  Please keep your appointments with your specialists as you may have planned  Please go to the LAB in the Basement (turn left off the elevator) for the tests to be done today  You will be contacted by phone if any changes need to be made immediately.  Otherwise, you will receive a letter about your results with an explanation, but please check with MyChart first.  Please remember to sign up for MyChart if you have not done so, as this will be important to you in the future with finding out test results, communicating by private email, and scheduling acute appointments online when needed.   
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
## 2017-09-19 LAB — URINE CULTURE
MICRO NUMBER: 81354963
SPECIMEN QUALITY: ADEQUATE

## 2017-09-21 DIAGNOSIS — B001 Herpesviral vesicular dermatitis: Secondary | ICD-10-CM | POA: Insufficient documentation

## 2017-09-21 DIAGNOSIS — R350 Frequency of micturition: Secondary | ICD-10-CM | POA: Insufficient documentation

## 2017-09-21 NOTE — Assessment & Plan Note (Signed)
D/w pt, ok for otc abreva asd,  to f/u any worsening symptoms or concerns

## 2017-09-21 NOTE — Assessment & Plan Note (Signed)
I would hold the xeljanz pending tx for acute infectious illness, likely to restart in 1 wk

## 2017-09-21 NOTE — Assessment & Plan Note (Signed)
Unclear, cant r;/o, will have antibx as ordered, also for urine studies

## 2017-09-21 NOTE — Assessment & Plan Note (Signed)
Mild to mod, for antibx course,  to f/u any worsening symptoms or concerns 

## 2017-09-28 ENCOUNTER — Other Ambulatory Visit: Payer: Self-pay | Admitting: Internal Medicine

## 2017-10-24 ENCOUNTER — Other Ambulatory Visit: Payer: Self-pay | Admitting: Internal Medicine

## 2017-11-07 DIAGNOSIS — M25571 Pain in right ankle and joints of right foot: Secondary | ICD-10-CM | POA: Diagnosis not present

## 2017-11-07 DIAGNOSIS — M150 Primary generalized (osteo)arthritis: Secondary | ICD-10-CM | POA: Diagnosis not present

## 2017-11-07 DIAGNOSIS — Z79899 Other long term (current) drug therapy: Secondary | ICD-10-CM | POA: Diagnosis not present

## 2017-11-07 DIAGNOSIS — M2550 Pain in unspecified joint: Secondary | ICD-10-CM | POA: Diagnosis not present

## 2017-11-07 DIAGNOSIS — M0609 Rheumatoid arthritis without rheumatoid factor, multiple sites: Secondary | ICD-10-CM | POA: Diagnosis not present

## 2017-11-09 ENCOUNTER — Other Ambulatory Visit (INDEPENDENT_AMBULATORY_CARE_PROVIDER_SITE_OTHER): Payer: Medicare HMO

## 2017-11-09 DIAGNOSIS — Z0000 Encounter for general adult medical examination without abnormal findings: Secondary | ICD-10-CM

## 2017-11-09 DIAGNOSIS — R7302 Impaired glucose tolerance (oral): Secondary | ICD-10-CM | POA: Diagnosis not present

## 2017-11-09 LAB — LIPID PANEL
CHOLESTEROL: 145 mg/dL (ref 0–200)
HDL: 45.6 mg/dL (ref >39.00)
LDL Cholesterol: 77 mg/dL (ref 0–99)
NONHDL: 98.91
Total CHOL/HDL Ratio: 3
Triglycerides: 111 mg/dL (ref 0.0–149.0)
VLDL: 22.2 mg/dL (ref 0.0–40.0)

## 2017-11-09 LAB — HEPATIC FUNCTION PANEL
ALT: 17 U/L (ref 0–35)
AST: 20 U/L (ref 0–37)
Albumin: 4.2 g/dL (ref 3.5–5.2)
Alkaline Phosphatase: 77 U/L (ref 39–117)
BILIRUBIN TOTAL: 0.6 mg/dL (ref 0.2–1.2)
Bilirubin, Direct: 0.2 mg/dL (ref 0.0–0.3)
TOTAL PROTEIN: 7.3 g/dL (ref 6.0–8.3)

## 2017-11-09 LAB — CBC WITH DIFFERENTIAL/PLATELET
BASOS PCT: 0.6 % (ref 0.0–3.0)
Basophils Absolute: 0 10*3/uL (ref 0.0–0.1)
EOS PCT: 2.5 % (ref 0.0–5.0)
Eosinophils Absolute: 0.2 10*3/uL (ref 0.0–0.7)
HEMATOCRIT: 38.3 % (ref 36.0–46.0)
HEMOGLOBIN: 12.9 g/dL (ref 12.0–15.0)
LYMPHS PCT: 9.9 % — AB (ref 12.0–46.0)
Lymphs Abs: 0.8 10*3/uL (ref 0.7–4.0)
MCHC: 33.6 g/dL (ref 30.0–36.0)
MCV: 96.6 fl (ref 78.0–100.0)
MONOS PCT: 8.2 % (ref 3.0–12.0)
Monocytes Absolute: 0.7 10*3/uL (ref 0.1–1.0)
Neutro Abs: 6.6 10*3/uL (ref 1.4–7.7)
Neutrophils Relative %: 78.8 % — ABNORMAL HIGH (ref 43.0–77.0)
Platelets: 321 10*3/uL (ref 150.0–400.0)
RBC: 3.97 Mil/uL (ref 3.87–5.11)
RDW: 13.8 % (ref 11.5–15.5)
WBC: 8.4 10*3/uL (ref 4.0–10.5)

## 2017-11-09 LAB — BASIC METABOLIC PANEL
BUN: 13 mg/dL (ref 6–23)
CALCIUM: 9.3 mg/dL (ref 8.4–10.5)
CO2: 27 mEq/L (ref 19–32)
Chloride: 97 mEq/L (ref 96–112)
Creatinine, Ser: 0.89 mg/dL (ref 0.40–1.20)
GFR: 66.17 mL/min (ref >60.00)
GLUCOSE: 112 mg/dL — AB (ref 70–99)
POTASSIUM: 3.3 meq/L — AB (ref 3.5–5.1)
SODIUM: 133 meq/L — AB (ref 135–145)

## 2017-11-09 LAB — URINALYSIS, ROUTINE W REFLEX MICROSCOPIC
Bilirubin Urine: NEGATIVE
Hgb urine dipstick: NEGATIVE
Ketones, ur: NEGATIVE
Nitrite: NEGATIVE
PH: 7 (ref 5.0–8.0)
RBC / HPF: NONE SEEN (ref 0–?)
Specific Gravity, Urine: 1.01 (ref 1.000–1.030)
TOTAL PROTEIN, URINE-UPE24: NEGATIVE
Urine Glucose: NEGATIVE
Urobilinogen, UA: 0.2 (ref 0.0–1.0)

## 2017-11-09 LAB — HEMOGLOBIN A1C: HEMOGLOBIN A1C: 5.7 % (ref 4.6–6.5)

## 2017-11-09 LAB — TSH: TSH: 5.65 u[IU]/mL — AB (ref 0.35–4.50)

## 2017-11-14 DIAGNOSIS — H353131 Nonexudative age-related macular degeneration, bilateral, early dry stage: Secondary | ICD-10-CM | POA: Diagnosis not present

## 2017-11-14 DIAGNOSIS — H18413 Arcus senilis, bilateral: Secondary | ICD-10-CM | POA: Diagnosis not present

## 2017-11-14 DIAGNOSIS — H02875 Vascular anomalies of left lower eyelid: Secondary | ICD-10-CM | POA: Diagnosis not present

## 2017-11-14 DIAGNOSIS — Z79899 Other long term (current) drug therapy: Secondary | ICD-10-CM | POA: Diagnosis not present

## 2017-11-14 DIAGNOSIS — Z961 Presence of intraocular lens: Secondary | ICD-10-CM | POA: Diagnosis not present

## 2017-11-14 DIAGNOSIS — H04123 Dry eye syndrome of bilateral lacrimal glands: Secondary | ICD-10-CM | POA: Diagnosis not present

## 2017-11-14 DIAGNOSIS — H11423 Conjunctival edema, bilateral: Secondary | ICD-10-CM | POA: Diagnosis not present

## 2017-11-14 DIAGNOSIS — M069 Rheumatoid arthritis, unspecified: Secondary | ICD-10-CM | POA: Diagnosis not present

## 2017-11-14 DIAGNOSIS — H11153 Pinguecula, bilateral: Secondary | ICD-10-CM | POA: Diagnosis not present

## 2017-11-14 DIAGNOSIS — H40013 Open angle with borderline findings, low risk, bilateral: Secondary | ICD-10-CM | POA: Diagnosis not present

## 2017-11-15 ENCOUNTER — Ambulatory Visit: Payer: Medicare HMO | Admitting: Internal Medicine

## 2017-11-16 ENCOUNTER — Other Ambulatory Visit: Payer: Self-pay | Admitting: Internal Medicine

## 2017-11-21 ENCOUNTER — Encounter: Payer: Self-pay | Admitting: Gastroenterology

## 2017-11-22 ENCOUNTER — Other Ambulatory Visit: Payer: Self-pay | Admitting: Internal Medicine

## 2017-11-27 ENCOUNTER — Ambulatory Visit: Payer: Medicare HMO | Admitting: Internal Medicine

## 2017-11-28 ENCOUNTER — Ambulatory Visit: Payer: Medicare HMO | Admitting: Internal Medicine

## 2017-11-30 ENCOUNTER — Ambulatory Visit (INDEPENDENT_AMBULATORY_CARE_PROVIDER_SITE_OTHER): Payer: Medicare HMO | Admitting: Internal Medicine

## 2017-11-30 ENCOUNTER — Encounter: Payer: Self-pay | Admitting: Internal Medicine

## 2017-11-30 VITALS — BP 116/82 | HR 71 | Temp 98.5°F | Ht 65.0 in | Wt 160.0 lb

## 2017-11-30 DIAGNOSIS — R7302 Impaired glucose tolerance (oral): Secondary | ICD-10-CM | POA: Diagnosis not present

## 2017-11-30 DIAGNOSIS — M069 Rheumatoid arthritis, unspecified: Secondary | ICD-10-CM | POA: Diagnosis not present

## 2017-11-30 DIAGNOSIS — I10 Essential (primary) hypertension: Secondary | ICD-10-CM | POA: Diagnosis not present

## 2017-11-30 DIAGNOSIS — Z0000 Encounter for general adult medical examination without abnormal findings: Secondary | ICD-10-CM | POA: Diagnosis not present

## 2017-11-30 MED ORDER — METHYLPREDNISOLONE ACETATE 80 MG/ML IJ SUSP
80.0000 mg | Freq: Once | INTRAMUSCULAR | Status: AC
  Administered 2017-11-30: 80 mg via INTRAMUSCULAR

## 2017-11-30 NOTE — Assessment & Plan Note (Signed)
Mild to mod, for depomedrol IM 80,  to f/u any worsening symptoms or concerns 

## 2017-11-30 NOTE — Assessment & Plan Note (Signed)
Lab Results  Component Value Date   HGBA1C 5.7 11/09/2017  stable overall by history and exam, recent data reviewed with pt, and pt to continue medical treatment as before,  to f/u any worsening symptoms or concerns

## 2017-11-30 NOTE — Patient Instructions (Addendum)
You will be contacted regarding the referral for: colonoscopy  You had the steroid shot today  Please continue all other medications as before, and refills have been done if requested.  Please have the pharmacy call with any other refills you may need.  Please continue your efforts at being more active, low cholesterol diet, and weight control.  You are otherwise up to date with prevention measures today.  Please keep your appointments with your specialists as you may have planned  Please return in 6 months, or sooner if needed, with Lab testing done 3-5 days before
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
## 2017-11-30 NOTE — Assessment & Plan Note (Signed)

## 2017-11-30 NOTE — Assessment & Plan Note (Signed)
stable overall by history and exam, recent data reviewed with pt, and pt to continue medical treatment as before,  to f/u any worsening symptoms or concerns BP Readings from Last 3 Encounters:  11/30/17 116/82  09/18/17 130/80  05/24/17 130/84

## 2017-12-06 ENCOUNTER — Encounter: Payer: Self-pay | Admitting: Gastroenterology

## 2017-12-17 ENCOUNTER — Other Ambulatory Visit: Payer: Self-pay | Admitting: Internal Medicine

## 2017-12-18 NOTE — Telephone Encounter (Signed)
Done erx 

## 2017-12-21 ENCOUNTER — Other Ambulatory Visit: Payer: Self-pay | Admitting: Internal Medicine

## 2017-12-25 ENCOUNTER — Other Ambulatory Visit: Payer: Self-pay | Admitting: Internal Medicine

## 2018-01-24 ENCOUNTER — Ambulatory Visit (AMBULATORY_SURGERY_CENTER): Payer: Self-pay | Admitting: *Deleted

## 2018-01-24 ENCOUNTER — Encounter: Payer: Self-pay | Admitting: Gastroenterology

## 2018-01-24 ENCOUNTER — Other Ambulatory Visit: Payer: Self-pay

## 2018-01-24 VITALS — Ht 65.0 in | Wt 158.0 lb

## 2018-01-24 DIAGNOSIS — Z86010 Personal history of colonic polyps: Secondary | ICD-10-CM

## 2018-01-24 MED ORDER — PEG 3350-KCL-NA BICARB-NACL 420 G PO SOLR: 4000.0000 mL | Freq: Once | ORAL | 0 refills | Status: AC

## 2018-01-24 NOTE — Progress Notes (Signed)
No egg or soy allergy known to patient  No issues with past sedation with any surgeries  or procedures, no intubation problems  No diet pills per patient No home 02 use per patient  No blood thinners per patient  Pt denies issues with constipation  Pt has hx of  A fib - om metoprolol- not on any blood thinners   EMMI video sent to pt's e mail  - pt has no e mail  Pt requested suprep- called [facility]- suprep is 99.03 per pharmacist with her insurance- pt declined and ok with golytely.

## 2018-02-06 DIAGNOSIS — M0609 Rheumatoid arthritis without rheumatoid factor, multiple sites: Secondary | ICD-10-CM | POA: Diagnosis not present

## 2018-02-06 DIAGNOSIS — M2550 Pain in unspecified joint: Secondary | ICD-10-CM | POA: Diagnosis not present

## 2018-02-06 DIAGNOSIS — R634 Abnormal weight loss: Secondary | ICD-10-CM | POA: Diagnosis not present

## 2018-02-06 DIAGNOSIS — M25571 Pain in right ankle and joints of right foot: Secondary | ICD-10-CM | POA: Diagnosis not present

## 2018-02-06 DIAGNOSIS — M150 Primary generalized (osteo)arthritis: Secondary | ICD-10-CM | POA: Diagnosis not present

## 2018-02-06 DIAGNOSIS — M549 Dorsalgia, unspecified: Secondary | ICD-10-CM | POA: Diagnosis not present

## 2018-02-06 DIAGNOSIS — Z79899 Other long term (current) drug therapy: Secondary | ICD-10-CM | POA: Diagnosis not present

## 2018-02-07 ENCOUNTER — Ambulatory Visit (AMBULATORY_SURGERY_CENTER): Payer: Medicare HMO | Admitting: Gastroenterology

## 2018-02-07 ENCOUNTER — Other Ambulatory Visit: Payer: Self-pay

## 2018-02-07 ENCOUNTER — Encounter: Payer: Self-pay | Admitting: Gastroenterology

## 2018-02-07 VITALS — BP 139/65 | HR 57 | Temp 98.0°F | Resp 13 | Ht 65.0 in | Wt 158.0 lb

## 2018-02-07 DIAGNOSIS — Z86010 Personal history of colonic polyps: Secondary | ICD-10-CM

## 2018-02-07 DIAGNOSIS — D123 Benign neoplasm of transverse colon: Secondary | ICD-10-CM | POA: Diagnosis not present

## 2018-02-07 DIAGNOSIS — Z1211 Encounter for screening for malignant neoplasm of colon: Secondary | ICD-10-CM | POA: Diagnosis not present

## 2018-02-07 DIAGNOSIS — K635 Polyp of colon: Secondary | ICD-10-CM

## 2018-02-07 DIAGNOSIS — Z8371 Family history of colonic polyps: Secondary | ICD-10-CM | POA: Diagnosis not present

## 2018-02-07 HISTORY — PX: COLONOSCOPY: SHX174

## 2018-02-07 MED ORDER — SODIUM CHLORIDE 0.9 % IV SOLN: 500.0000 mL | Freq: Once | INTRAVENOUS | Status: DC

## 2018-02-07 NOTE — Patient Instructions (Signed)
HANDOUTS GIVEN FOR POLYPS AND HEMORRHOIDS  YOU HAD AN ENDOSCOPIC PROCEDURE TODAY AT THE [facility]:   Refer to the procedure report that was given to you for any specific questions about what was found during the examination.  If the procedure report does not answer your questions, please call your gastroenterologist to clarify.  If you requested that your care partner not be given the details of your procedure findings, then the procedure report has been included in a sealed envelope for you to review at your convenience later.  YOU SHOULD EXPECT: Some feelings of bloating in the abdomen. Passage of more gas than usual.  Walking can help get rid of the air that was put into your GI tract during the procedure and reduce the bloating. If you had a lower endoscopy (such as a colonoscopy or flexible sigmoidoscopy) you may notice spotting of blood in your stool or on the toilet paper. If you underwent a bowel prep for your procedure, you may not have a normal bowel movement for a few days.  Please Note:  You might notice some irritation and congestion in your nose or some drainage.  This is from the oxygen used during your procedure.  There is no need for concern and it should clear up in a day or so.  SYMPTOMS TO REPORT IMMEDIATELY:   Following lower endoscopy (colonoscopy or flexible sigmoidoscopy):  Excessive amounts of blood in the stool  Significant tenderness or worsening of abdominal pains  Swelling of the abdomen that is new, acute  Fever of 100F or higher   For urgent or emergent issues, a gastroenterologist can be reached at any hour by calling [phone].   DIET:  We do recommend a small meal at first, but then you may proceed to your regular diet.  Drink plenty of fluids but you should avoid alcoholic beverages for 24 hours.  ACTIVITY:  You should plan to take it easy for the rest of today and you should NOT DRIVE or use heavy machinery until tomorrow (because of the  sedation medicines used during the test).    FOLLOW UP: Our staff will call the number listed on your records the next business day following your procedure to check on you and address any questions or concerns that you may have regarding the information given to you following your procedure. If we do not reach you, we will leave a message.  However, if you are feeling well and you are not experiencing any problems, there is no need to return our call.  We will assume that you have returned to your regular daily activities without incident.  If any biopsies were taken you will be contacted by phone or by letter within the next 1-3 weeks.  Please call us at [phone] if you have not heard about the biopsies in 3 weeks.    SIGNATURES/CONFIDENTIALITY: You and/or your care partner have signed paperwork which will be entered into your electronic medical record.  These signatures attest to the fact that that the information above on your After Visit Summary has been reviewed and is understood.  Full responsibility of the confidentiality of this discharge information lies with you and/or your care-partner. 

## 2018-02-07 NOTE — Progress Notes (Signed)
Pt's states no medical or surgical changes since previsit or office visit.  No egg or soy allergy  

## 2018-02-07 NOTE — Progress Notes (Signed)
A and O x3. Report to RN. Tolerated MAC anesthesia well.

## 2018-02-07 NOTE — Op Note (Addendum)
[facility] Patient Name: Lexany Belknap Procedure Date: 02/07/2018 10:56 AM MRN: [id number] Endoscopist: Ladene Artist , MD Age: 72 Referring MD:  Date of Birth: 1946-04-13 Gender: Female Account #: [id number] Procedure:                Colonoscopy Indications:              Surveillance: Personal history of adenomatous                            polyps on last colonoscopy 5 years ago. Family                            history of colon polyps. Medicines:                Monitored Anesthesia Care Procedure:                Pre-Anesthesia Assessment:                           - Prior to the procedure, a History and Physical                            was performed, and patient medications and                            allergies were reviewed. The patient's tolerance of                            previous anesthesia was also reviewed. The risks                            and benefits of the procedure and the sedation                            options and risks were discussed with the patient.                            All questions were answered, and informed consent                            was obtained. Prior Anticoagulants: The patient has                            taken no previous anticoagulant or antiplatelet                            agents. ASA Grade Assessment: II - A patient with                            mild systemic disease. After reviewing the risks                            and benefits, the patient was deemed in  satisfactory condition to undergo the procedure.                           After obtaining informed consent, the colonoscope                            was passed under direct vision. Throughout the                            procedure, the patient's blood pressure, pulse, and                            oxygen saturations were monitored continuously. The                            Model PCF-[id number]) scope  was introduced                            through the anus and advanced to the the cecum,                            identified by appendiceal orifice and ileocecal                            valve. The ileocecal valve, appendiceal orifice,                            and rectum were photographed. The quality of the                            bowel preparation was good. The colonoscopy was                            performed without difficulty. The patient tolerated                            the procedure well. Scope In: 11:04:16 AM Scope Out: 11:22:03 AM Scope Withdrawal Time: 0 hours 12 minutes 43 seconds  Total Procedure Duration: 0 hours 17 minutes 47 seconds  Findings:                 The perianal and digital rectal examinations were                            normal.                           Two sessile polyps were found in the transverse                            colon. The polyps were 5 to 6 mm in size. These                            polyps were removed with a cold snare. Resection  and retrieval were complete.                           Internal hemorrhoids were found during                            retroflexion. The hemorrhoids were small and Grade                            I (internal hemorrhoids that do not prolapse).                           The exam was otherwise without abnormality on                            direct and retroflexion views. Complications:            No immediate complications. Estimated blood loss:                            None. Estimated Blood Loss:     Estimated blood loss: none. Impression:               - Two 5 to 6 mm polyps in the transverse colon,                            removed with a cold snare. Resected and retrieved.                           - Internal hemorrhoids.                           - The examination was otherwise normal on direct                            and retroflexion  views. Recommendation:           - Repeat colonoscopy in 5 years for surveillance.                           - Patient has a contact number available for                            emergencies. The signs and symptoms of potential                            delayed complications were discussed with the                            patient. Return to normal activities tomorrow.                            Written discharge instructions were provided to the                            patient.                           -  Resume previous diet.                           - Continue present medications.                           - Await pathology results. Ladene Artist, MD 02/07/2018 11:24:52 AM This report has been signed electronically.

## 2018-02-07 NOTE — Progress Notes (Signed)
Called to room to assist during endoscopic procedure.  Patient ID and intended procedure confirmed with present staff. Received instructions for my participation in the procedure from the performing physician.  

## 2018-02-08 ENCOUNTER — Telehealth: Payer: Self-pay

## 2018-02-08 NOTE — Telephone Encounter (Signed)
  Follow up Call-  Call back number 02/07/2018  Post procedure Call Back phone  # [phone]  Permission to leave phone message Yes  Some recent data might be hidden     Patient questions:  Do you have a fever, pain , or abdominal swelling? No. Pain Score  0 *  Have you tolerated food without any problems? Yes.    Have you been able to return to your normal activities? Yes.    Do you have any questions about your discharge instructions: Diet   No. Medications  No. Follow up visit  No.  Do you have questions or concerns about your Care? No.  Actions: * If pain score is 4 or above: No action needed, pain <4.

## 2018-02-15 ENCOUNTER — Telehealth: Payer: Self-pay | Admitting: Internal Medicine

## 2018-02-15 DIAGNOSIS — Z7184 Encounter for health counseling related to travel: Secondary | ICD-10-CM

## 2018-02-15 NOTE — Telephone Encounter (Signed)
Ok for measles testing if pt wants to do this  - order done

## 2018-02-15 NOTE — Telephone Encounter (Signed)
Patient states she was concerned about the measles that are going around.  Patient state she had the measles when she was younger.  Would like to know if she needs to have a vac for this ?

## 2018-02-16 NOTE — Telephone Encounter (Signed)
Pt has been informed and will come in next week.

## 2018-02-18 ENCOUNTER — Encounter: Payer: Self-pay | Admitting: Gastroenterology

## 2018-02-20 ENCOUNTER — Encounter: Payer: Self-pay | Admitting: Internal Medicine

## 2018-02-20 ENCOUNTER — Ambulatory Visit (INDEPENDENT_AMBULATORY_CARE_PROVIDER_SITE_OTHER): Payer: Medicare HMO | Admitting: Internal Medicine

## 2018-02-20 VITALS — BP 126/82 | HR 78 | Temp 98.3°F | Ht 65.0 in | Wt 156.0 lb

## 2018-02-20 DIAGNOSIS — M069 Rheumatoid arthritis, unspecified: Secondary | ICD-10-CM

## 2018-02-20 DIAGNOSIS — Z23 Encounter for immunization: Secondary | ICD-10-CM

## 2018-02-20 DIAGNOSIS — E039 Hypothyroidism, unspecified: Secondary | ICD-10-CM | POA: Diagnosis not present

## 2018-02-20 DIAGNOSIS — J309 Allergic rhinitis, unspecified: Secondary | ICD-10-CM

## 2018-02-20 DIAGNOSIS — R7302 Impaired glucose tolerance (oral): Secondary | ICD-10-CM

## 2018-02-20 MED ORDER — PREDNISONE 10 MG PO TABS: ORAL_TABLET | ORAL | 0 refills | Status: DC

## 2018-02-20 MED ORDER — DICLOFENAC SODIUM 1 % TD GEL: 4.0000 g | Freq: Four times a day (QID) | TRANSDERMAL | 5 refills | Status: DC | PRN

## 2018-02-20 MED ORDER — METHYLPREDNISOLONE ACETATE 80 MG/ML IJ SUSP
80.0000 mg | Freq: Once | INTRAMUSCULAR | Status: AC
  Administered 2018-02-20: 80 mg via INTRAMUSCULAR

## 2018-02-20 MED ORDER — LEVOTHYROXINE SODIUM 25 MCG PO TABS: 25.0000 ug | ORAL_TABLET | Freq: Every day | ORAL | 3 refills | Status: DC
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
## 2018-02-20 NOTE — Assessment & Plan Note (Signed)
New onset asympt, for low dose levothyroxine 25 qd

## 2018-02-20 NOTE — Patient Instructions (Addendum)
You had the steroid shot today., and the MMR vaccination  Please take all new medication as prescribed  - the voltaren gel topical treatment as needed, prednisone, and thyroid medication  Please continue all other medications as before, and refills have been done if requested.  Please have the pharmacy call with any other refills you may need.  Please continue your efforts at being more active, low cholesterol diet, and weight control.  You are otherwise up to date with prevention measures today.  Please keep your appointments with your specialists as you may have planned

## 2018-02-20 NOTE — Assessment & Plan Note (Signed)
stable overall by history and exam, recent data reviewed with pt, and pt to continue medical treatment as before,  to f/u any worsening symptoms or concerns / Lab Results  Component Value Date   HGBA1C 5.7 11/09/2017   

## 2018-02-20 NOTE — Assessment & Plan Note (Signed)
Mild to mod, for depomedrol IM 80, predpac asd, volt gel prn, f/u rheum

## 2018-02-20 NOTE — Assessment & Plan Note (Signed)
Mild to mod, for depomedrol IM 80, predpac asd, to f/u any worsening symptoms or concerns 

## 2018-02-22 ENCOUNTER — Telehealth: Payer: Self-pay | Admitting: Internal Medicine

## 2018-02-22 MED ORDER — METHYLPREDNISOLONE 4 MG PO TBPK: ORAL_TABLET | ORAL | 0 refills | Status: DC

## 2018-02-22 NOTE — Addendum Note (Signed)
Addended by: Biagio Borg on: 02/22/2018 12:45 PM   Modules accepted: Orders

## 2018-02-22 NOTE — Telephone Encounter (Signed)
Copied from [facility] [phone] [phone]. Topic: Quick Communication - Rx Refill/Question >> Feb 22, 2018 10:48 AM Clack, Janett Billow D wrote: Medication: predniSONE (DELTASONE) 10 MG tablet [[id number]]   (Agent: If no, request that the patient contact the pharmacy for the refill.) Preferred Pharmacy (with phone number or street name): [id number] - [place], [place] [phone] (Phone) [phone] (Fax)  **pt states that she has an allergie to prednisone and would like to know if something else could be called in.  Agent: Please be advised that RX refills may take up to 3 business days. We ask that you follow-up with your pharmacy.

## 2018-02-22 NOTE — Telephone Encounter (Signed)
Ok for change prednisone to medrol dose pack - done erx

## 2018-02-22 NOTE — Telephone Encounter (Signed)
Please advise 

## 2018-02-23 NOTE — Telephone Encounter (Signed)
Pt informed new rx has been sent. Pt stated understanding.

## 2018-03-05 DIAGNOSIS — D1801 Hemangioma of skin and subcutaneous tissue: Secondary | ICD-10-CM | POA: Diagnosis not present

## 2018-03-05 DIAGNOSIS — D225 Melanocytic nevi of trunk: Secondary | ICD-10-CM | POA: Diagnosis not present

## 2018-03-05 DIAGNOSIS — L821 Other seborrheic keratosis: Secondary | ICD-10-CM | POA: Diagnosis not present

## 2018-03-05 DIAGNOSIS — L814 Other melanin hyperpigmentation: Secondary | ICD-10-CM | POA: Diagnosis not present

## 2018-03-09 ENCOUNTER — Telehealth: Payer: Self-pay

## 2018-03-09 NOTE — Telephone Encounter (Signed)
I would have to decline, since the itching was much more likely due to the allergy problem she was having (or even dry skin) for which the depomedrol is the treatment  Unless she had worsening redness, hives or swelling of some kind, this is not likely a true allergy

## 2018-03-09 NOTE — Telephone Encounter (Signed)
Patient states she has been itching all over, including head and face since she has started methylprednisolone you ordered on 02/22/18---routing to dr Jenny Reichmann, her RA is improved, so she doesn't think she needs anything else for RA right now, but would like this medication to be added to her allergy list---please advise if you need me to do anything else

## 2018-03-13 NOTE — Telephone Encounter (Signed)
Patient informed of dr Judi Cong note/instructions---she did not get hives or rash and she is doing better now

## 2018-04-19 ENCOUNTER — Other Ambulatory Visit: Payer: Self-pay | Admitting: Internal Medicine

## 2018-04-24 ENCOUNTER — Ambulatory Visit: Payer: Self-pay

## 2018-04-24 NOTE — Telephone Encounter (Signed)
Okay to schedule in a same day slot tomorrow with Dr Jenny Reichmann?

## 2018-04-24 NOTE — Telephone Encounter (Signed)
Yes that will be fine. 

## 2018-04-24 NOTE — Telephone Encounter (Signed)
Pt. C/o sinus pain and pressure x 3 days. Coughed up "some bloody phlegm yesterday".Had a fever a couple of days ago. Eyes hurt, right ear ache. Tried OTC medications without relief. Please advise pt. About an appointment for tomorrow. Only wants to see Dr. Jenny Reichmann.  Reason for Disposition . Fever present > 3 days (72 hours)  Answer Assessment - Initial Assessment Questions 1. LOCATION: "Where does it hurt?"      Nose 2. ONSET: "When did the sinus pain start?"  (e.g., hours, days)      3 days ago 3. SEVERITY: "How bad is the pain?"   (Scale 1-10; mild, moderate or severe)   - MILD (1-3): doesn't interfere with normal activities    - MODERATE (4-7): interferes with normal activities (e.g., work or school) or awakens from sleep   - SEVERE (8-10): excruciating pain and patient unable to do any normal activities        Mild 3-4  4. RECURRENT SYMPTOM: "Have you ever had sinus problems before?" If so, ask: "When was the last time?" and "What happened that time?"      Yes 5. NASAL CONGESTION: "Is the nose blocked?" If so, ask, "Can you open it or must you breathe through the mouth?"     Stuffy 6. NASAL DISCHARGE: "Do you have discharge from your nose?" If so ask, "What color?"     Unsure 7. FEVER: "Do you have a fever?" If so, ask: "What is it, how was it measured, and when did it start?"      No 8. OTHER SYMPTOMS: "Do you have any other symptoms?" (e.g., sore throat, cough, earache, difficulty breathing)     Eye pain, cough, right ear pain 9. PREGNANCY: "Is there any chance you are pregnant?" "When was your last menstrual period?"     No  Protocols used: SINUS PAIN OR CONGESTION-A-AH

## 2018-04-24 NOTE — Telephone Encounter (Signed)
Appointment scheduled with Dr Jenny Reichmann tomorrow at 4:00. Left message informing patient.

## 2018-04-25 ENCOUNTER — Ambulatory Visit (INDEPENDENT_AMBULATORY_CARE_PROVIDER_SITE_OTHER): Payer: Medicare HMO | Admitting: Internal Medicine

## 2018-04-25 ENCOUNTER — Encounter: Payer: Self-pay | Admitting: Internal Medicine

## 2018-04-25 VITALS — BP 128/84 | HR 85 | Temp 98.2°F | Ht 65.0 in | Wt 153.0 lb

## 2018-04-25 DIAGNOSIS — I10 Essential (primary) hypertension: Secondary | ICD-10-CM | POA: Diagnosis not present

## 2018-04-25 DIAGNOSIS — R059 Cough, unspecified: Secondary | ICD-10-CM

## 2018-04-25 DIAGNOSIS — R05 Cough: Secondary | ICD-10-CM

## 2018-04-25 DIAGNOSIS — R7302 Impaired glucose tolerance (oral): Secondary | ICD-10-CM

## 2018-04-25 MED ORDER — AZITHROMYCIN 250 MG PO TABS: ORAL_TABLET | ORAL | 1 refills | Status: DC

## 2018-04-25 MED ORDER — HYDROCODONE-HOMATROPINE 5-1.5 MG/5ML PO SYRP: 5.0000 mL | ORAL_SOLUTION | Freq: Four times a day (QID) | ORAL | 0 refills | Status: AC | PRN

## 2018-04-25 NOTE — Patient Instructions (Addendum)
Please take all new medication as prescribed - the antibiotic, and cough medicine if needed  Please continue all other medications as before, and refills have been done if requested.  Please have the pharmacy call with any other refills you may need.  Please continue your efforts at being more active, low cholesterol diet, and weight control.  Please keep your appointments with your specialists as you may have planned
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
## 2018-04-25 NOTE — Assessment & Plan Note (Signed)
stable overall by history and exam, recent data reviewed with pt, and pt to continue medical treatment as before,  to f/u any worsening symptoms or concerns BP Readings from Last 3 Encounters:  04/25/18 128/84  02/20/18 126/82  02/07/18 139/65

## 2018-04-25 NOTE — Assessment & Plan Note (Signed)
stable overall by history and exam, recent data reviewed with pt, and pt to continue medical treatment as before,  to f/u any worsening symptoms or concerns / Lab Results  Component Value Date   HGBA1C 5.7 11/09/2017

## 2018-04-25 NOTE — Assessment & Plan Note (Signed)
C/w sinus infection acute, Mild to mod, for antibx course,  to f/u any worsening symptoms or concerns

## 2018-04-26 ENCOUNTER — Telehealth: Payer: Self-pay | Admitting: Internal Medicine

## 2018-04-26 NOTE — Telephone Encounter (Signed)
Pt informed of MD's advisement. 

## 2018-04-26 NOTE — Telephone Encounter (Signed)
Patient has called into the [facility].   She is currently at the pharmacy.   States she is allergic to codeine (states pt has had anaphylactic shock) and would like something else called into [facility] at [facility] in place of they hydrocodone.

## 2018-04-26 NOTE — Telephone Encounter (Signed)
No worries, since she has NOT been prescribed codeine  Ok to continue the cough med as is, as this contains hydrocodone which is the same as what she took from [place] in 2017 in the form of Vicodin

## 2018-05-08 DIAGNOSIS — M2550 Pain in unspecified joint: Secondary | ICD-10-CM | POA: Diagnosis not present

## 2018-05-08 DIAGNOSIS — M0609 Rheumatoid arthritis without rheumatoid factor, multiple sites: Secondary | ICD-10-CM | POA: Diagnosis not present

## 2018-05-08 DIAGNOSIS — R634 Abnormal weight loss: Secondary | ICD-10-CM | POA: Diagnosis not present

## 2018-05-08 DIAGNOSIS — M549 Dorsalgia, unspecified: Secondary | ICD-10-CM | POA: Diagnosis not present

## 2018-05-08 DIAGNOSIS — M150 Primary generalized (osteo)arthritis: Secondary | ICD-10-CM | POA: Diagnosis not present

## 2018-05-08 DIAGNOSIS — Z79899 Other long term (current) drug therapy: Secondary | ICD-10-CM | POA: Diagnosis not present

## 2018-05-09 DIAGNOSIS — H3530 Unspecified macular degeneration: Secondary | ICD-10-CM | POA: Diagnosis not present

## 2018-05-09 DIAGNOSIS — Z9849 Cataract extraction status, unspecified eye: Secondary | ICD-10-CM | POA: Diagnosis not present

## 2018-05-09 DIAGNOSIS — H0100B Unspecified blepharitis left eye, upper and lower eyelids: Secondary | ICD-10-CM | POA: Diagnosis not present

## 2018-05-09 DIAGNOSIS — Z79899 Other long term (current) drug therapy: Secondary | ICD-10-CM | POA: Diagnosis not present

## 2018-05-09 DIAGNOSIS — H0100A Unspecified blepharitis right eye, upper and lower eyelids: Secondary | ICD-10-CM | POA: Diagnosis not present

## 2018-05-09 DIAGNOSIS — H11153 Pinguecula, bilateral: Secondary | ICD-10-CM | POA: Diagnosis not present

## 2018-05-09 DIAGNOSIS — H04123 Dry eye syndrome of bilateral lacrimal glands: Secondary | ICD-10-CM | POA: Diagnosis not present

## 2018-05-09 DIAGNOSIS — H40023 Open angle with borderline findings, high risk, bilateral: Secondary | ICD-10-CM | POA: Diagnosis not present

## 2018-05-09 DIAGNOSIS — Z961 Presence of intraocular lens: Secondary | ICD-10-CM | POA: Diagnosis not present

## 2018-05-09 DIAGNOSIS — H18413 Arcus senilis, bilateral: Secondary | ICD-10-CM | POA: Diagnosis not present

## 2018-05-25 ENCOUNTER — Telehealth: Payer: Self-pay | Admitting: Cardiology

## 2018-05-25 NOTE — Telephone Encounter (Signed)
New message   Patient states that she is numb in her feet and lips and sweating. Please call patient.

## 2018-05-25 NOTE — Telephone Encounter (Signed)
Spoke with patient who states she has numbness around her lips and chin that started this morning. Denies headache. Complains of sweating over last 2 days but not today Woke up during the night to get water and her feet felt funny. States she only takes medication for thyroid, denies that she takes Neurontin, Rheumatrex, or prednisone. Denies any recent changes in medical therapy. Denies chest discomfort or SOB; denies worsening palpitations. Patient confirms that she is able to make a fist with bilateral hands and denies weakness on one side of body or the other. She states she will walk to a mirror to check for any facial drooping but she does feel anything unusual with her face. I advised her to call 911 for s/s of CVA and otherwise to contact her PCP for evaluation of symptoms described above. She has an appointment with Dr. Curt Bears in September and I advised her to call back sooner with questions or concerns. She verbalized understanding and agreement and thanked me for the call.

## 2018-05-28 ENCOUNTER — Ambulatory Visit: Payer: Medicare HMO | Admitting: Cardiology

## 2018-05-30 ENCOUNTER — Encounter: Payer: Self-pay | Admitting: Internal Medicine

## 2018-05-30 ENCOUNTER — Ambulatory Visit (INDEPENDENT_AMBULATORY_CARE_PROVIDER_SITE_OTHER): Payer: Medicare HMO | Admitting: Internal Medicine

## 2018-05-30 VITALS — BP 122/76 | HR 78 | Temp 98.7°F | Ht 65.0 in | Wt 153.0 lb

## 2018-05-30 DIAGNOSIS — I10 Essential (primary) hypertension: Secondary | ICD-10-CM

## 2018-05-30 DIAGNOSIS — E039 Hypothyroidism, unspecified: Secondary | ICD-10-CM | POA: Diagnosis not present

## 2018-05-30 DIAGNOSIS — E785 Hyperlipidemia, unspecified: Secondary | ICD-10-CM

## 2018-05-30 DIAGNOSIS — R7302 Impaired glucose tolerance (oral): Secondary | ICD-10-CM

## 2018-05-30 MED ORDER — LEVOTHYROXINE SODIUM 50 MCG PO TABS: 50.0000 ug | ORAL_TABLET | Freq: Every day | ORAL | 3 refills | Status: DC

## 2018-05-30 NOTE — Patient Instructions (Signed)
OK to increase the thyroid medication to 50 mcg per day  Please continue all other medications as before, and refills have been done if requested.  Please have the pharmacy call with any other refills you may need.  Please continue your efforts at being more active, low cholesterol diet, and weight control.  Please keep your appointments with your specialists as you may have planned  Please return in 6 months, or sooner if needed, with Lab testing done 3-5 days before

## 2018-05-30 NOTE — Assessment & Plan Note (Signed)
stable overall by history and exam, recent data reviewed with pt, and pt to continue medical treatment as before,  to f/u any worsening symptoms or concerns  
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
## 2018-05-30 NOTE — Assessment & Plan Note (Signed)
stable overall by history and exam, recent data reviewed with pt, and pt to continue medical treatment as before,  to f/u any worsening symptoms or concerns Lab Results  Component Value Date   HGBA1C 5.7 11/09/2017

## 2018-05-30 NOTE — Assessment & Plan Note (Signed)
Mild uncontrolled, to increase the levothryoxine to 50 mcg per day, f/u lab with next labs

## 2018-06-13 ENCOUNTER — Encounter: Payer: Self-pay | Admitting: Internal Medicine

## 2018-06-13 ENCOUNTER — Other Ambulatory Visit: Payer: Self-pay | Admitting: Internal Medicine

## 2018-06-13 ENCOUNTER — Ambulatory Visit (INDEPENDENT_AMBULATORY_CARE_PROVIDER_SITE_OTHER): Payer: Medicare HMO | Admitting: Internal Medicine

## 2018-06-13 ENCOUNTER — Other Ambulatory Visit (INDEPENDENT_AMBULATORY_CARE_PROVIDER_SITE_OTHER): Payer: Medicare HMO

## 2018-06-13 VITALS — BP 126/84 | HR 72 | Temp 98.3°F | Ht 65.0 in | Wt 156.0 lb

## 2018-06-13 DIAGNOSIS — I10 Essential (primary) hypertension: Secondary | ICD-10-CM | POA: Diagnosis not present

## 2018-06-13 DIAGNOSIS — M545 Low back pain, unspecified: Secondary | ICD-10-CM

## 2018-06-13 DIAGNOSIS — R3915 Urgency of urination: Secondary | ICD-10-CM

## 2018-06-13 DIAGNOSIS — R7302 Impaired glucose tolerance (oral): Secondary | ICD-10-CM | POA: Diagnosis not present

## 2018-06-13 LAB — URINALYSIS, ROUTINE W REFLEX MICROSCOPIC
Bilirubin Urine: NEGATIVE
HGB URINE DIPSTICK: NEGATIVE
Ketones, ur: NEGATIVE
Nitrite: NEGATIVE
Specific Gravity, Urine: <=1.005 — AB (ref 1.000–1.030)
Total Protein, Urine: NEGATIVE
URINE GLUCOSE: NEGATIVE
UROBILINOGEN UA: 0.2 (ref 0.0–1.0)
pH: 6 (ref 5.0–8.0)

## 2018-06-13 MED ORDER — TIZANIDINE HCL 2 MG PO TABS: 2.0000 mg | ORAL_TABLET | Freq: Four times a day (QID) | ORAL | 1 refills | Status: DC | PRN

## 2018-06-13 MED ORDER — AMOXICILLIN 500 MG PO CAPS: 500.0000 mg | ORAL_CAPSULE | Freq: Three times a day (TID) | ORAL | 0 refills | Status: DC

## 2018-06-13 NOTE — Assessment & Plan Note (Signed)
I suspect MSk pain, for muscle relaxer prn,  to f/u any worsening symptoms or concerns

## 2018-06-13 NOTE — Assessment & Plan Note (Signed)
stable overall by history and exam, recent data reviewed with pt, and pt to continue medical treatment as before,  to f/u any worsening symptoms or concerns Lab Results  Component Value Date   HGBA1C 5.7 11/09/2017
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
## 2018-06-13 NOTE — Assessment & Plan Note (Signed)
stable overall by history and exam, recent data reviewed with pt, and pt to continue medical treatment as before,  to f/u any worsening symptoms or concerns BP Readings from Last 3 Encounters:  06/13/18 126/84  05/30/18 122/76  04/25/18 128/84

## 2018-06-13 NOTE — Assessment & Plan Note (Signed)
Etiology unclear, for urine studies and tx pending results

## 2018-06-13 NOTE — Patient Instructions (Addendum)
Please take all new medication as prescribed - the muscle relaxer  Please continue all other medications as before, and refills have been done if requested.  Please have the pharmacy call with any other refills you may need.  Please keep your appointments with your specialists as you may have planned  Please go to the LAB in the Basement (turn left off the elevator) for the tests to be done today  If the urine testing is negative today, you should be ok to take the Methotrexate  You will be contacted by phone if any changes need to be made immediately.  Otherwise, you will receive a letter about your results with an explanation, but please check with MyChart first.  Please remember to sign up for MyChart if you have not done so, as this will be important to you in the future with finding out test results, communicating by private email, and scheduling acute appointments online when needed.

## 2018-06-14 ENCOUNTER — Telehealth: Payer: Self-pay

## 2018-06-14 LAB — URINE CULTURE
MICRO NUMBER:: 91029662
SPECIMEN QUALITY:: ADEQUATE

## 2018-06-14 NOTE — Telephone Encounter (Signed)
-----   Message from Biagio Borg, MD sent at 06/13/2018  5:12 PM EDT ----- Letter sent, cont same tx except  The test results show that your current treatment is OK, except the urine test does seem to suggest a possible infection.  I will send an antibiotic, and you should hear from the office as well.  The urine culture should return in 2-3 days   Harold Mattes to please inform pt, I will do rx

## 2018-06-14 NOTE — Telephone Encounter (Signed)
Pt has been informed of results and expressed understanding.  °

## 2018-06-16 ENCOUNTER — Other Ambulatory Visit: Payer: Self-pay | Admitting: Internal Medicine

## 2018-06-17 ENCOUNTER — Other Ambulatory Visit: Payer: Self-pay | Admitting: Internal Medicine

## 2018-06-19 ENCOUNTER — Telehealth: Payer: Self-pay

## 2018-06-19 NOTE — Telephone Encounter (Signed)
Copied from [place] [id number]. Topic: Quick Communication - See Telephone Encounter >> Jun 19, 2018 10:47 AM Antonieta Iba C wrote: CRM for notification. See Telephone encounter for: 06/19/18.  Pt called in to be advised. Pt would like to know how soon after completing her antibiotic should she start back taking her arthritis medication? Pt also is calling in to find out her results for her culture.   CB: [phone]

## 2018-06-19 NOTE — Telephone Encounter (Signed)
Pt has been informed.

## 2018-06-19 NOTE — Telephone Encounter (Signed)
Urine culture was negative.  No need for further antibx, ok to restart the arthritis med

## 2018-06-22 ENCOUNTER — Other Ambulatory Visit: Payer: Self-pay | Admitting: Internal Medicine

## 2018-06-26 ENCOUNTER — Encounter: Payer: Self-pay | Admitting: Cardiology

## 2018-06-26 ENCOUNTER — Ambulatory Visit: Payer: Medicare HMO | Admitting: Cardiology

## 2018-06-26 VITALS — BP 134/78 | HR 75 | Ht 65.0 in | Wt 153.0 lb

## 2018-06-26 DIAGNOSIS — R002 Palpitations: Secondary | ICD-10-CM | POA: Diagnosis not present

## 2018-06-26 DIAGNOSIS — I10 Essential (primary) hypertension: Secondary | ICD-10-CM | POA: Diagnosis not present

## 2018-06-26 NOTE — Patient Instructions (Signed)
Medication Instructions:  Your physician recommends that you continue on your current medications as directed. Please refer to the Current Medication list given to you today.  * If you need a refill on your cardiac medications before your next appointment, please call your pharmacy.   Labwork: None ordered  Testing/Procedures: None ordered  Follow-Up: Your physician wants you to follow-up in: 1 year with Dr. Camnitz.  You will receive a reminder letter in the mail two months in advance. If you don't receive a letter, please call our office to schedule the follow-up appointment.  *Please note that any paperwork needing to be filled out by the provider will need to be addressed at the front desk prior to seeing the provider. Please note that any FMLA, disability or other documents regarding health condition is subject to a $25.00 charge that must be received prior to completion of paperwork in the form of a money order or check.  Thank you for choosing [facility]!!   Helayna Dun, RN [phone]      
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
## 2018-07-06 DIAGNOSIS — Z23 Encounter for immunization: Secondary | ICD-10-CM | POA: Diagnosis not present

## 2018-07-16 ENCOUNTER — Other Ambulatory Visit: Payer: Self-pay | Admitting: Internal Medicine

## 2018-07-16 MED ORDER — ALBUTEROL SULFATE HFA 108 (90 BASE) MCG/ACT IN AERS: 2.0000 | INHALATION_SPRAY | Freq: Four times a day (QID) | RESPIRATORY_TRACT | 1 refills | Status: DC | PRN

## 2018-07-16 NOTE — Telephone Encounter (Signed)
Copied from [place] [id number]. Topic: Quick Communication - Rx Refill/Question >> Jul 16, 2018  9:05 AM Scherrie Gerlach wrote: Medication: albuterol (PROVENTIL HFA;VENTOLIN HFA) 108 (90 Base) MCG/ACT inhaler  Pt states she is having allergy issues and requesting this inhaler, although has not had in a while. [id number] [address], [place] [phone] (Phone) [phone] (Fax)

## 2018-07-16 NOTE — Telephone Encounter (Signed)
Albuterol inhaler refill Last Refill:02/03/16 # 1 inhaler 1 RF Last OV: 02/03/16 PCP: Dr Jenny Reichmann Pharmacy:[facility] [address]

## 2018-07-20 ENCOUNTER — Other Ambulatory Visit: Payer: Self-pay | Admitting: Internal Medicine

## 2018-07-20 DIAGNOSIS — Z1231 Encounter for screening mammogram for malignant neoplasm of breast: Secondary | ICD-10-CM

## 2018-08-20 DIAGNOSIS — M549 Dorsalgia, unspecified: Secondary | ICD-10-CM | POA: Diagnosis not present

## 2018-08-20 DIAGNOSIS — Z79899 Other long term (current) drug therapy: Secondary | ICD-10-CM | POA: Diagnosis not present

## 2018-08-20 DIAGNOSIS — M2550 Pain in unspecified joint: Secondary | ICD-10-CM | POA: Diagnosis not present

## 2018-08-20 DIAGNOSIS — M62838 Other muscle spasm: Secondary | ICD-10-CM | POA: Diagnosis not present

## 2018-08-20 DIAGNOSIS — M150 Primary generalized (osteo)arthritis: Secondary | ICD-10-CM | POA: Diagnosis not present

## 2018-08-20 DIAGNOSIS — M542 Cervicalgia: Secondary | ICD-10-CM | POA: Diagnosis not present

## 2018-08-20 DIAGNOSIS — M0609 Rheumatoid arthritis without rheumatoid factor, multiple sites: Secondary | ICD-10-CM | POA: Diagnosis not present

## 2018-08-20 DIAGNOSIS — R634 Abnormal weight loss: Secondary | ICD-10-CM | POA: Diagnosis not present

## 2018-09-05 ENCOUNTER — Ambulatory Visit
Admission: RE | Admit: 2018-09-05 | Discharge: 2018-09-05 | Disposition: A | Discharge status: Home / Self Care | Payer: Medicare HMO | Source: Ambulatory Visit | Attending: Internal Medicine | Admitting: Internal Medicine

## 2018-09-05 DIAGNOSIS — Z1231 Encounter for screening mammogram for malignant neoplasm of breast: Secondary | ICD-10-CM

## 2018-11-08 ENCOUNTER — Telehealth: Payer: Self-pay | Admitting: Cardiology

## 2018-11-08 NOTE — Telephone Encounter (Signed)
Will route to all physicians to see if ok to switch providers.

## 2018-11-08 NOTE — Telephone Encounter (Signed)
I would prefer she stay with Dr. Curt Bears

## 2018-11-08 NOTE — Telephone Encounter (Signed)
New message     Pt no longer wants to see Dr, Curt Bears  But wants to se either Dr. Radford Pax or Tamala Julian. Pt had seen Dr. Tamala Julian in 2014.

## 2018-11-09 NOTE — Telephone Encounter (Signed)
Agree with Dr. Radford Pax.

## 2018-11-12 NOTE — Telephone Encounter (Signed)
Informed pt of [name] recommendation. Pt simply wanted a little clarification as to why she wasn't seeing [name] anymore.  I gave her rationale behind the switch. Further explained the difference b/t general cardiologist and electrophysiologist.   Pt feels much better after speaking with me and understands reasoning for staying w/ EP dept. She is fine to stay w/ Camnitz at this time. She appreciates my call and thanks me for talking with her about this.

## 2018-11-30 ENCOUNTER — Encounter: Payer: Self-pay | Admitting: Internal Medicine

## 2018-11-30 DIAGNOSIS — Z79899 Other long term (current) drug therapy: Secondary | ICD-10-CM | POA: Diagnosis not present

## 2018-11-30 DIAGNOSIS — M069 Rheumatoid arthritis, unspecified: Secondary | ICD-10-CM | POA: Diagnosis not present

## 2018-12-01 ENCOUNTER — Other Ambulatory Visit: Payer: Self-pay | Admitting: Internal Medicine

## 2018-12-05 ENCOUNTER — Ambulatory Visit (INDEPENDENT_AMBULATORY_CARE_PROVIDER_SITE_OTHER): Payer: Medicare HMO | Admitting: Internal Medicine

## 2018-12-05 ENCOUNTER — Encounter: Payer: Self-pay | Admitting: Internal Medicine

## 2018-12-05 ENCOUNTER — Other Ambulatory Visit (INDEPENDENT_AMBULATORY_CARE_PROVIDER_SITE_OTHER): Payer: Medicare HMO

## 2018-12-05 ENCOUNTER — Telehealth: Payer: Self-pay | Admitting: Internal Medicine

## 2018-12-05 VITALS — BP 118/68 | HR 71 | Temp 98.6°F | Ht 65.0 in | Wt 149.0 lb

## 2018-12-05 DIAGNOSIS — Z0000 Encounter for general adult medical examination without abnormal findings: Secondary | ICD-10-CM

## 2018-12-05 DIAGNOSIS — R7302 Impaired glucose tolerance (oral): Secondary | ICD-10-CM

## 2018-12-05 DIAGNOSIS — E2839 Other primary ovarian failure: Secondary | ICD-10-CM

## 2018-12-05 LAB — URINALYSIS, ROUTINE W REFLEX MICROSCOPIC
Bilirubin Urine: NEGATIVE
HGB URINE DIPSTICK: NEGATIVE
Ketones, ur: NEGATIVE
Nitrite: NEGATIVE
Specific Gravity, Urine: 1.015 (ref 1.000–1.030)
Total Protein, Urine: NEGATIVE
Urine Glucose: NEGATIVE
Urobilinogen, UA: 0.2 (ref 0.0–1.0)
pH: 6 (ref 5.0–8.0)

## 2018-12-05 LAB — CBC WITH DIFFERENTIAL/PLATELET
BASOS ABS: 0.1 10*3/uL (ref 0.0–0.1)
Basophils Relative: 0.9 % (ref 0.0–3.0)
Eosinophils Absolute: 0.2 10*3/uL (ref 0.0–0.7)
Eosinophils Relative: 1.9 % (ref 0.0–5.0)
HCT: 38.9 % (ref 36.0–46.0)
Hemoglobin: 13.3 g/dL (ref 12.0–15.0)
Lymphocytes Relative: 15.5 % (ref 12.0–46.0)
Lymphs Abs: 1.4 10*3/uL (ref 0.7–4.0)
MCHC: 34.2 g/dL (ref 30.0–36.0)
MCV: 95.9 fl (ref 78.0–100.0)
Monocytes Absolute: 0.8 10*3/uL (ref 0.1–1.0)
Monocytes Relative: 8.6 % (ref 3.0–12.0)
Neutro Abs: 6.4 10*3/uL (ref 1.4–7.7)
Neutrophils Relative %: 73.1 % (ref 43.0–77.0)
Platelets: 350 10*3/uL (ref 150.0–400.0)
RBC: 4.05 Mil/uL (ref 3.87–5.11)
RDW: 14.1 % (ref 11.5–15.5)
WBC: 8.8 10*3/uL (ref 4.0–10.5)

## 2018-12-05 LAB — LIPID PANEL
CHOL/HDL RATIO: 3
Cholesterol: 129 mg/dL (ref 0–200)
HDL: 38.6 mg/dL — ABNORMAL LOW (ref >39.00)
LDL Cholesterol: 66 mg/dL (ref 0–99)
NonHDL: 89.91
Triglycerides: 120 mg/dL (ref 0.0–149.0)
VLDL: 24 mg/dL (ref 0.0–40.0)

## 2018-12-05 LAB — HEPATIC FUNCTION PANEL
ALT: 15 U/L (ref 0–35)
AST: 18 U/L (ref 0–37)
Albumin: 4.3 g/dL (ref 3.5–5.2)
Alkaline Phosphatase: 91 U/L (ref 39–117)
BILIRUBIN DIRECT: 0.1 mg/dL (ref 0.0–0.3)
Total Bilirubin: 0.4 mg/dL (ref 0.2–1.2)
Total Protein: 7.3 g/dL (ref 6.0–8.3)

## 2018-12-05 LAB — TSH: TSH: 1.65 u[IU]/mL (ref 0.35–4.50)

## 2018-12-05 LAB — HEMOGLOBIN A1C: Hgb A1c MFr Bld: 5.4 % (ref 4.6–6.5)

## 2018-12-05 LAB — BASIC METABOLIC PANEL
BUN: 15 mg/dL (ref 6–23)
CO2: 28 meq/L (ref 19–32)
Calcium: 9.8 mg/dL (ref 8.4–10.5)
Chloride: 97 mEq/L (ref 96–112)
Creatinine, Ser: 0.83 mg/dL (ref 0.40–1.20)
GFR: 67.28 mL/min (ref >60.00)
Glucose, Bld: 104 mg/dL — ABNORMAL HIGH (ref 70–99)
Potassium: 3.8 mEq/L (ref 3.5–5.1)
Sodium: 133 mEq/L — ABNORMAL LOW (ref 135–145)

## 2018-12-05 NOTE — Assessment & Plan Note (Signed)

## 2018-12-05 NOTE — Telephone Encounter (Signed)
Copied from [place] [id number]. Topic: Quick Communication - See Telephone Encounter >> Dec 05, 2018  4:33 PM Bea Graff, NT wrote: CRM for notification. See Telephone encounter for: 12/05/18. Pt calling to see if Dr. Jenny Reichmann is wanting her to have bone density scan and if he does if the order can be put in.

## 2018-12-05 NOTE — Assessment & Plan Note (Signed)
stable overall by history and exam, recent data reviewed with pt, and pt to continue medical treatment as before,  to f/u any worsening symptoms or concerns  

## 2018-12-05 NOTE — Patient Instructions (Signed)
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
## 2018-12-06 NOTE — Telephone Encounter (Signed)
Patient is scheduled   

## 2018-12-06 NOTE — Addendum Note (Signed)
Addended by: Biagio Borg on: 12/06/2018 01:11 PM   Modules accepted: Orders

## 2018-12-06 NOTE — Telephone Encounter (Signed)
Order done  [facility] to help pt schedule

## 2018-12-13 ENCOUNTER — Ambulatory Visit (INDEPENDENT_AMBULATORY_CARE_PROVIDER_SITE_OTHER)
Admission: RE | Admit: 2018-12-13 | Discharge: 2018-12-13 | Disposition: A | Discharge status: Home / Self Care | Payer: Medicare HMO | Source: Ambulatory Visit | Attending: Internal Medicine | Admitting: Internal Medicine

## 2018-12-13 DIAGNOSIS — E2839 Other primary ovarian failure: Secondary | ICD-10-CM

## 2018-12-14 ENCOUNTER — Other Ambulatory Visit: Payer: Self-pay | Admitting: Internal Medicine

## 2018-12-17 ENCOUNTER — Encounter: Payer: Self-pay | Admitting: Internal Medicine

## 2018-12-17 ENCOUNTER — Telehealth: Payer: Self-pay

## 2018-12-17 NOTE — Telephone Encounter (Signed)
Pt has been informed and expressed understanding.  Copied from [place] [phone]. Topic: General - Inquiry >> Dec 17, 2018  4:01 PM Selinda Flavin B, NT wrote: Reason for CRM: Patient calling to to get bone density results. Please advise.

## 2018-12-18 NOTE — Telephone Encounter (Signed)
Patient called back requesting a call back from [place] with additional questions about results. Please advise.

## 2018-12-19 ENCOUNTER — Other Ambulatory Visit: Payer: Self-pay | Admitting: Internal Medicine

## 2018-12-19 NOTE — Telephone Encounter (Signed)
Pt wanted to go over her lab work again from her 2/19 visit. She expressed understanding.

## 2018-12-25 ENCOUNTER — Telehealth: Payer: Self-pay | Admitting: Internal Medicine

## 2018-12-25 DIAGNOSIS — R251 Tremor, unspecified: Secondary | ICD-10-CM

## 2018-12-25 NOTE — Telephone Encounter (Signed)
Copied from [place] [phone]. Topic: Referral - Status >> Dec 25, 2018  4:14 PM Rayann Heman wrote: Reason for CRM: pt called and stated that dr Jenny Reichmann said that he would put a referral in for tremors. Pt calling to check status. Pt did not know where the referral was going. Please advise. Pt would like a call back from the nurse.

## 2018-12-25 NOTE — Telephone Encounter (Signed)
Ok referral done 

## 2018-12-25 NOTE — Telephone Encounter (Signed)
Dr. Jenny Reichmann I do not see where this referral was placed. Please advise.   Lori/Mary- I have attached ya'll to see if it could be expedited once he has ordered it.

## 2018-12-25 NOTE — Addendum Note (Signed)
Addended by: Biagio Borg on: 12/25/2018 05:15 PM   Modules accepted: Orders

## 2019-01-03 ENCOUNTER — Encounter: Payer: Self-pay | Admitting: Neurology

## 2019-01-04 ENCOUNTER — Other Ambulatory Visit: Payer: Self-pay | Admitting: Internal Medicine

## 2019-01-14 ENCOUNTER — Other Ambulatory Visit: Payer: Self-pay | Admitting: Internal Medicine

## 2019-02-24 ENCOUNTER — Other Ambulatory Visit: Payer: Self-pay | Admitting: Internal Medicine

## 2019-03-05 ENCOUNTER — Telehealth: Payer: Self-pay

## 2019-03-05 NOTE — Telephone Encounter (Signed)
Ok, but please let her know that Dr Tat may be willing to do a Televisit, as I have seen other patients do this  OK to have Neurology call her with change to Televisit if patient is able to do this

## 2019-03-05 NOTE — Telephone Encounter (Signed)
Noted   Copied from [place] [phone]. Topic: Quick Communication - See Telephone Encounter >> Mar 05, 2019  4:00 PM [name] wrote: CRM for notification. See Telephone encounter for: 03/05/19.Going to cancel Appt with [name] [facility] just for the time being afraid to be going into office. New to her and this COVID, she will reschedule. Wanted to make sure Dr Jenny Reichmann understood!

## 2019-03-09 ENCOUNTER — Other Ambulatory Visit: Payer: Self-pay | Admitting: Internal Medicine

## 2019-03-14 ENCOUNTER — Telehealth: Payer: Self-pay

## 2019-03-14 NOTE — Telephone Encounter (Signed)
Copied from [place] [phone]. Topic: General - Other >> Mar 14, 2019  2:52 PM Mcneil, Ja-Kwan wrote: Reason for CRM: Pt stated she would like Dr. Jenny Reichmann to increase the topiramate (TOPAMAX) 50 MG tablet because she has been experiencing more trimmers than normal. Pt requests a call back.

## 2019-03-15 MED ORDER — TOPIRAMATE 100 MG PO TABS: 100.0000 mg | ORAL_TABLET | Freq: Two times a day (BID) | ORAL | 3 refills | Status: DC

## 2019-03-15 NOTE — Telephone Encounter (Signed)
[place] for increased topamax to 100 bid - done erx

## 2019-03-15 NOTE — Telephone Encounter (Signed)
Pt has been informed and expressed understanding.  

## 2019-03-15 NOTE — Addendum Note (Signed)
Addended by: Biagio Borg on: 03/15/2019 01:47 PM   Modules accepted: Orders

## 2019-03-20 ENCOUNTER — Ambulatory Visit: Payer: Medicare HMO | Admitting: Neurology

## 2019-04-03 DIAGNOSIS — M62838 Other muscle spasm: Secondary | ICD-10-CM | POA: Diagnosis not present

## 2019-04-03 DIAGNOSIS — M2550 Pain in unspecified joint: Secondary | ICD-10-CM | POA: Diagnosis not present

## 2019-04-03 DIAGNOSIS — M0609 Rheumatoid arthritis without rheumatoid factor, multiple sites: Secondary | ICD-10-CM | POA: Diagnosis not present

## 2019-04-03 DIAGNOSIS — I10 Essential (primary) hypertension: Secondary | ICD-10-CM | POA: Diagnosis not present

## 2019-04-03 DIAGNOSIS — M150 Primary generalized (osteo)arthritis: Secondary | ICD-10-CM | POA: Diagnosis not present

## 2019-04-03 DIAGNOSIS — E7800 Pure hypercholesterolemia, unspecified: Secondary | ICD-10-CM | POA: Diagnosis not present

## 2019-04-03 DIAGNOSIS — R634 Abnormal weight loss: Secondary | ICD-10-CM | POA: Diagnosis not present

## 2019-04-03 DIAGNOSIS — R251 Tremor, unspecified: Secondary | ICD-10-CM | POA: Diagnosis not present

## 2019-04-03 DIAGNOSIS — Z79899 Other long term (current) drug therapy: Secondary | ICD-10-CM | POA: Diagnosis not present

## 2019-04-16 ENCOUNTER — Telehealth: Payer: Self-pay

## 2019-04-16 NOTE — Telephone Encounter (Signed)
Really cant say anything from this limited information  She may need OV to discuss her exact symptoms and whether they may be actually related to medications or other health issue

## 2019-04-16 NOTE — Telephone Encounter (Signed)
Copied from [place] [phone]. Topic: General - Other >> Apr 16, 2019  9:42 AM Rainey Pines A wrote: Patient stated that taking the gabapentin and topamax is not making her feel well. Patient would like a callback from [facility] nurse in what she needs to do.

## 2019-04-17 NOTE — Telephone Encounter (Signed)
Please schedule an OV or a doxy. Thanks!

## 2019-04-17 NOTE — Telephone Encounter (Signed)
Scheduled

## 2019-04-18 ENCOUNTER — Ambulatory Visit (INDEPENDENT_AMBULATORY_CARE_PROVIDER_SITE_OTHER): Payer: Medicare HMO | Admitting: Internal Medicine

## 2019-04-18 ENCOUNTER — Encounter: Payer: Self-pay | Admitting: Internal Medicine

## 2019-04-18 ENCOUNTER — Other Ambulatory Visit: Payer: Self-pay

## 2019-04-18 VITALS — BP 126/84 | HR 71 | Temp 98.4°F | Ht 65.0 in | Wt 154.0 lb

## 2019-04-18 DIAGNOSIS — R7302 Impaired glucose tolerance (oral): Secondary | ICD-10-CM | POA: Diagnosis not present

## 2019-04-18 DIAGNOSIS — M545 Low back pain, unspecified: Secondary | ICD-10-CM

## 2019-04-18 DIAGNOSIS — I10 Essential (primary) hypertension: Secondary | ICD-10-CM | POA: Diagnosis not present

## 2019-04-18 NOTE — Patient Instructions (Signed)
OK to stop the gabapentin  Please continue all other medications as before, and refills have been done if requested.  Please have the pharmacy call with any other refills you may need.  Please continue your efforts at being more active, low cholesterol diet, and weight control.  Please keep your appointments with your specialists as you may have planned
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
## 2019-04-19 ENCOUNTER — Encounter: Payer: Self-pay | Admitting: Internal Medicine

## 2019-04-19 NOTE — Assessment & Plan Note (Signed)
stable overall by history and exam, recent data reviewed with pt, and pt to continue medical treatment as before,  to f/u any worsening symptoms or concerns  

## 2019-04-19 NOTE — Assessment & Plan Note (Signed)
Ok to leave off the gabapentin for now, o/w stable overall by history and exam, recent data reviewed with pt, and pt to continue medical treatment as before,  to f/u any worsening symptoms or concerns

## 2019-04-28 ENCOUNTER — Other Ambulatory Visit: Payer: Self-pay | Admitting: Internal Medicine

## 2019-05-08 ENCOUNTER — Other Ambulatory Visit: Payer: Self-pay | Admitting: Internal Medicine

## 2019-05-23 DIAGNOSIS — H35033 Hypertensive retinopathy, bilateral: Secondary | ICD-10-CM | POA: Diagnosis not present

## 2019-05-23 DIAGNOSIS — H0102A Squamous blepharitis right eye, upper and lower eyelids: Secondary | ICD-10-CM | POA: Diagnosis not present

## 2019-05-23 DIAGNOSIS — H18413 Arcus senilis, bilateral: Secondary | ICD-10-CM | POA: Diagnosis not present

## 2019-05-23 DIAGNOSIS — H353131 Nonexudative age-related macular degeneration, bilateral, early dry stage: Secondary | ICD-10-CM | POA: Diagnosis not present

## 2019-05-23 DIAGNOSIS — H0102B Squamous blepharitis left eye, upper and lower eyelids: Secondary | ICD-10-CM | POA: Diagnosis not present

## 2019-05-23 DIAGNOSIS — H16223 Keratoconjunctivitis sicca, not specified as Sjogren's, bilateral: Secondary | ICD-10-CM | POA: Diagnosis not present

## 2019-05-23 DIAGNOSIS — Z79899 Other long term (current) drug therapy: Secondary | ICD-10-CM | POA: Diagnosis not present

## 2019-05-23 DIAGNOSIS — H40023 Open angle with borderline findings, high risk, bilateral: Secondary | ICD-10-CM | POA: Diagnosis not present

## 2019-05-23 DIAGNOSIS — H04123 Dry eye syndrome of bilateral lacrimal glands: Secondary | ICD-10-CM | POA: Diagnosis not present

## 2019-05-23 DIAGNOSIS — H11153 Pinguecula, bilateral: Secondary | ICD-10-CM | POA: Diagnosis not present
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
## 2019-06-04 ENCOUNTER — Encounter: Payer: Self-pay | Admitting: Neurology

## 2019-06-04 ENCOUNTER — Ambulatory Visit: Payer: Medicare HMO | Admitting: Neurology

## 2019-06-04 ENCOUNTER — Other Ambulatory Visit: Payer: Self-pay

## 2019-06-04 VITALS — BP 149/78 | HR 82 | Ht 65.0 in | Wt 146.6 lb

## 2019-06-04 DIAGNOSIS — G243 Spasmodic torticollis: Secondary | ICD-10-CM | POA: Diagnosis not present

## 2019-06-04 NOTE — Patient Instructions (Signed)
You have cervical dystonia causing the tremor.  You can let us know if you want botox for this.  It was a pleasure seeing you today.  The physicians and staff at [facility] are committed to providing excellent care. You may receive a survey requesting feedback about your experience at our office. We strive to receive "very good" responses to the survey questions. If you feel that your experience would prevent you from giving the office a "very good " response, please contact our office to try to remedy the situation. We may be reached at [phone]. Thank you for taking the time out of your busy day to complete the survey.

## 2019-06-06 ENCOUNTER — Telehealth: Payer: Self-pay | Admitting: Cardiology

## 2019-06-06 NOTE — Telephone Encounter (Signed)
New Message   Patient states that she has a vein on her left leg, she states that the vein pokes out and hurting. Patient also states that she had surgery on the same leg years ago. The vein is on the front part of her thigh and it gets really sore. Please give patient a call back.

## 2019-06-06 NOTE — Telephone Encounter (Signed)
Pt reports L leg vein that is poking out. Pt denies any symptoms other than slight pain when she presses on the vein in her leg, she has had surgery on this leg in the past pertaining to the veins in her L leg. Pt denies any numbness, tingling or swelling. Pt reports normal color in L leg. Advised pt to call the physician who originally did the surgery, she is agreeable.

## 2019-07-04 DIAGNOSIS — E7800 Pure hypercholesterolemia, unspecified: Secondary | ICD-10-CM | POA: Diagnosis not present

## 2019-07-04 DIAGNOSIS — R251 Tremor, unspecified: Secondary | ICD-10-CM | POA: Diagnosis not present

## 2019-07-04 DIAGNOSIS — I10 Essential (primary) hypertension: Secondary | ICD-10-CM | POA: Diagnosis not present

## 2019-07-04 DIAGNOSIS — R634 Abnormal weight loss: Secondary | ICD-10-CM | POA: Diagnosis not present

## 2019-07-04 DIAGNOSIS — M25531 Pain in right wrist: Secondary | ICD-10-CM | POA: Diagnosis not present

## 2019-07-04 DIAGNOSIS — M0609 Rheumatoid arthritis without rheumatoid factor, multiple sites: Secondary | ICD-10-CM | POA: Diagnosis not present

## 2019-07-04 DIAGNOSIS — M25561 Pain in right knee: Secondary | ICD-10-CM | POA: Diagnosis not present

## 2019-07-04 DIAGNOSIS — M150 Primary generalized (osteo)arthritis: Secondary | ICD-10-CM | POA: Diagnosis not present

## 2019-07-04 DIAGNOSIS — Z79899 Other long term (current) drug therapy: Secondary | ICD-10-CM | POA: Diagnosis not present

## 2019-07-04 DIAGNOSIS — M2550 Pain in unspecified joint: Secondary | ICD-10-CM | POA: Diagnosis not present

## 2019-07-12 DIAGNOSIS — Z23 Encounter for immunization: Secondary | ICD-10-CM | POA: Diagnosis not present

## 2019-07-23 ENCOUNTER — Encounter: Payer: Self-pay | Admitting: Internal Medicine

## 2019-07-23 ENCOUNTER — Other Ambulatory Visit: Payer: Self-pay

## 2019-07-23 ENCOUNTER — Ambulatory Visit (INDEPENDENT_AMBULATORY_CARE_PROVIDER_SITE_OTHER): Payer: Medicare HMO | Admitting: Internal Medicine

## 2019-07-23 ENCOUNTER — Other Ambulatory Visit (INDEPENDENT_AMBULATORY_CARE_PROVIDER_SITE_OTHER): Payer: Medicare HMO

## 2019-07-23 VITALS — BP 150/90 | HR 89 | Temp 98.2°F | Ht 65.0 in | Wt 146.0 lb

## 2019-07-23 DIAGNOSIS — R350 Frequency of micturition: Secondary | ICD-10-CM | POA: Diagnosis not present

## 2019-07-23 DIAGNOSIS — I10 Essential (primary) hypertension: Secondary | ICD-10-CM | POA: Diagnosis not present

## 2019-07-23 DIAGNOSIS — R7302 Impaired glucose tolerance (oral): Secondary | ICD-10-CM

## 2019-07-23 LAB — URINALYSIS, ROUTINE W REFLEX MICROSCOPIC
Bilirubin Urine: NEGATIVE
Hgb urine dipstick: NEGATIVE
Ketones, ur: NEGATIVE
Nitrite: NEGATIVE
Specific Gravity, Urine: 1.015 (ref 1.000–1.030)
Total Protein, Urine: NEGATIVE
Urine Glucose: NEGATIVE
Urobilinogen, UA: 0.2 (ref 0.0–1.0)
pH: 7 (ref 5.0–8.0)

## 2019-07-23 MED ORDER — CEFTRIAXONE SODIUM 1 G IJ SOLR
1.0000 g | Freq: Once | INTRAMUSCULAR | Status: AC
  Administered 2019-07-23: 17:00:00 1 g via INTRAMUSCULAR

## 2019-07-23 MED ORDER — CEPHALEXIN 500 MG PO CAPS: 500.0000 mg | ORAL_CAPSULE | Freq: Three times a day (TID) | ORAL | 0 refills | Status: AC

## 2019-07-23 NOTE — Assessment & Plan Note (Signed)
Mild elevated, likely reactive, cont same tx and follow 

## 2019-07-23 NOTE — Patient Instructions (Signed)
You had the antibiotic shot today (rocephin)  Please take all new medication as prescribed - the antibiotic  Please continue all other medications as before, and refills have been done if requested.  Please have the pharmacy call with any other refills you may need.  Please continue your efforts at being more active, low cholesterol diet, and weight control.  Please keep your appointments with your specialists as you may have planned  Please go to the LAB in the Basement (turn left off the elevator) for the tests to be done today - just the urine testing today  You will be contacted by phone if any changes need to be made immediately.  Otherwise, you will receive a letter about your results with an explanation, but please check with MyChart first.  Please remember to sign up for MyChart if you have not done so, as this will be important to you in the future with finding out test results, communicating by private email, and scheduling acute appointments online when needed.

## 2019-07-23 NOTE — Assessment & Plan Note (Signed)
Possible uti/pyelonephritis - for rocephin  Gm IM, cephalexin course after urine studies
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
## 2019-07-23 NOTE — Assessment & Plan Note (Signed)
stable overall by history and exam, recent data reviewed with pt, and pt to continue medical treatment as before,  to f/u any worsening symptoms or concerns  

## 2019-07-25 LAB — URINE CULTURE
MICRO NUMBER:: 959320
SPECIMEN QUALITY:: ADEQUATE

## 2019-07-29 ENCOUNTER — Encounter: Payer: Self-pay | Admitting: Internal Medicine

## 2019-07-29 ENCOUNTER — Other Ambulatory Visit: Payer: Self-pay | Admitting: Internal Medicine

## 2019-07-29 DIAGNOSIS — Z1231 Encounter for screening mammogram for malignant neoplasm of breast: Secondary | ICD-10-CM

## 2019-07-31 DIAGNOSIS — R69 Illness, unspecified: Secondary | ICD-10-CM | POA: Diagnosis not present

## 2019-09-10 ENCOUNTER — Other Ambulatory Visit: Payer: Self-pay

## 2019-09-10 ENCOUNTER — Ambulatory Visit: Payer: Medicare HMO | Admitting: Cardiology

## 2019-09-10 ENCOUNTER — Encounter: Payer: Self-pay | Admitting: Cardiology

## 2019-09-10 VITALS — BP 136/72 | HR 77 | Ht 65.0 in | Wt 145.4 lb

## 2019-09-10 DIAGNOSIS — I10 Essential (primary) hypertension: Secondary | ICD-10-CM

## 2019-09-10 MED ORDER — HYDROCHLOROTHIAZIDE 25 MG PO TABS: 25.0000 mg | ORAL_TABLET | Freq: Every day | ORAL | 3 refills | Status: DC
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
## 2019-09-10 NOTE — Patient Instructions (Signed)
Medication Instructions:  Your physician has recommended you make the following change in your medication:  1. INCREASE Hydrochlorothiazide to 25 mg daily.  * If you need a refill on your cardiac medications before your next appointment, please call your pharmacy.   Labwork: None ordered  Testing/Procedures: None ordered  Follow-Up: At [facility], you and your health needs are our priority.  As part of our continuing mission to provide you with exceptional heart care, we have created designated Provider Care Teams.  These Care Teams include your primary Cardiologist (physician) and Advanced Practice Providers (APPs -  Physician Assistants and Nurse Practitioners) who all work together to provide you with the care you need, when you need it.  You will need a follow up appointment in 1 year.  Please call our office 2 months in advance to schedule this appointment.  You may see Dr Curt Bears or one of the following Advanced Practice Providers on your designated Care Team:    Chanetta Marshall, NP  Tommye Standard, PA-C  Oda Kilts, [place]  Thank you for choosing [facility]!!   Trinidad Curet, RN [phone]  Any Other Special Instructions Will Be Listed Below (If Applicable).

## 2019-09-16 ENCOUNTER — Ambulatory Visit
Admission: RE | Admit: 2019-09-16 | Discharge: 2019-09-16 | Disposition: A | Discharge status: Home / Self Care | Payer: Medicare HMO | Source: Ambulatory Visit | Attending: Internal Medicine | Admitting: Internal Medicine

## 2019-09-16 ENCOUNTER — Other Ambulatory Visit: Payer: Self-pay

## 2019-09-16 DIAGNOSIS — Z1231 Encounter for screening mammogram for malignant neoplasm of breast: Secondary | ICD-10-CM | POA: Diagnosis not present

## 2019-10-09 DIAGNOSIS — R634 Abnormal weight loss: Secondary | ICD-10-CM | POA: Diagnosis not present

## 2019-10-09 DIAGNOSIS — M25531 Pain in right wrist: Secondary | ICD-10-CM | POA: Diagnosis not present

## 2019-10-09 DIAGNOSIS — E7800 Pure hypercholesterolemia, unspecified: Secondary | ICD-10-CM | POA: Diagnosis not present

## 2019-10-09 DIAGNOSIS — M150 Primary generalized (osteo)arthritis: Secondary | ICD-10-CM | POA: Diagnosis not present

## 2019-10-09 DIAGNOSIS — R251 Tremor, unspecified: Secondary | ICD-10-CM | POA: Diagnosis not present

## 2019-10-09 DIAGNOSIS — Z79899 Other long term (current) drug therapy: Secondary | ICD-10-CM | POA: Diagnosis not present

## 2019-10-09 DIAGNOSIS — I10 Essential (primary) hypertension: Secondary | ICD-10-CM | POA: Diagnosis not present

## 2019-10-09 DIAGNOSIS — M0609 Rheumatoid arthritis without rheumatoid factor, multiple sites: Secondary | ICD-10-CM | POA: Diagnosis not present

## 2019-10-09 DIAGNOSIS — M25561 Pain in right knee: Secondary | ICD-10-CM | POA: Diagnosis not present

## 2019-10-09 DIAGNOSIS — M2550 Pain in unspecified joint: Secondary | ICD-10-CM | POA: Diagnosis not present

## 2019-11-05 ENCOUNTER — Telehealth: Payer: Self-pay | Admitting: Internal Medicine

## 2019-11-05 NOTE — Telephone Encounter (Signed)
Pt would like call back from nurse to talk about vaccine.  She wants to make sure that she does not have any allergies to it. cb is [phone]

## 2019-11-06 NOTE — Telephone Encounter (Signed)
Pt contacted and informed per PCP that getting the vaccine should be fine.

## 2019-12-01 ENCOUNTER — Other Ambulatory Visit: Payer: Self-pay | Admitting: Internal Medicine

## 2019-12-01 NOTE — Telephone Encounter (Signed)
Please refill as per office routine med refill policy (all routine meds refilled for 3 mo or monthly per pt preference up to one year from last visit, then month to month grace period for 3 mo, then further med refills will have to be denied)  

## 2019-12-10 ENCOUNTER — Other Ambulatory Visit: Payer: Self-pay

## 2019-12-10 ENCOUNTER — Encounter: Payer: Self-pay | Admitting: Internal Medicine

## 2019-12-10 ENCOUNTER — Ambulatory Visit (INDEPENDENT_AMBULATORY_CARE_PROVIDER_SITE_OTHER): Payer: Medicare HMO | Admitting: Internal Medicine

## 2019-12-10 VITALS — BP 150/80 | HR 86 | Temp 98.8°F | Ht 65.0 in | Wt 146.0 lb

## 2019-12-10 DIAGNOSIS — R300 Dysuria: Secondary | ICD-10-CM

## 2019-12-10 DIAGNOSIS — I10 Essential (primary) hypertension: Secondary | ICD-10-CM

## 2019-12-10 DIAGNOSIS — R7302 Impaired glucose tolerance (oral): Secondary | ICD-10-CM

## 2019-12-10 DIAGNOSIS — E611 Iron deficiency: Secondary | ICD-10-CM

## 2019-12-10 DIAGNOSIS — Z0001 Encounter for general adult medical examination with abnormal findings: Secondary | ICD-10-CM | POA: Diagnosis not present

## 2019-12-10 DIAGNOSIS — F418 Other specified anxiety disorders: Secondary | ICD-10-CM

## 2019-12-10 DIAGNOSIS — E538 Deficiency of other specified B group vitamins: Secondary | ICD-10-CM | POA: Diagnosis not present

## 2019-12-10 DIAGNOSIS — E039 Hypothyroidism, unspecified: Secondary | ICD-10-CM

## 2019-12-10 DIAGNOSIS — R69 Illness, unspecified: Secondary | ICD-10-CM | POA: Diagnosis not present

## 2019-12-10 DIAGNOSIS — E559 Vitamin D deficiency, unspecified: Secondary | ICD-10-CM

## 2019-12-10 LAB — LIPID PANEL
Cholesterol: 112 mg/dL (ref 0–200)
HDL: 35.8 mg/dL — ABNORMAL LOW (ref >39.00)
LDL Cholesterol: 59 mg/dL (ref 0–99)
NonHDL: 76.49
Total CHOL/HDL Ratio: 3
Triglycerides: 86 mg/dL (ref 0.0–149.0)
VLDL: 17.2 mg/dL (ref 0.0–40.0)

## 2019-12-10 LAB — CBC WITH DIFFERENTIAL/PLATELET
Basophils Absolute: 0.1 10*3/uL (ref 0.0–0.1)
Basophils Relative: 1.1 % (ref 0.0–3.0)
Eosinophils Absolute: 0.2 10*3/uL (ref 0.0–0.7)
Eosinophils Relative: 1.9 % (ref 0.0–5.0)
HCT: 36.2 % (ref 36.0–46.0)
Hemoglobin: 12.4 g/dL (ref 12.0–15.0)
Lymphocytes Relative: 14.4 % (ref 12.0–46.0)
Lymphs Abs: 1.1 10*3/uL (ref 0.7–4.0)
MCHC: 34.2 g/dL (ref 30.0–36.0)
MCV: 95.9 fl (ref 78.0–100.0)
Monocytes Absolute: 0.5 10*3/uL (ref 0.1–1.0)
Monocytes Relative: 6.9 % (ref 3.0–12.0)
Neutro Abs: 6 10*3/uL (ref 1.4–7.7)
Neutrophils Relative %: 75.7 % (ref 43.0–77.0)
Platelets: 320 10*3/uL (ref 150.0–400.0)
RBC: 3.78 Mil/uL — ABNORMAL LOW (ref 3.87–5.11)
RDW: 14 % (ref 11.5–15.5)
WBC: 7.9 10*3/uL (ref 4.0–10.5)

## 2019-12-10 LAB — HEPATIC FUNCTION PANEL
ALT: 17 U/L (ref 0–35)
AST: 20 U/L (ref 0–37)
Albumin: 4.2 g/dL (ref 3.5–5.2)
Alkaline Phosphatase: 93 U/L (ref 39–117)
Bilirubin, Direct: 0.1 mg/dL (ref 0.0–0.3)
Total Bilirubin: 0.3 mg/dL (ref 0.2–1.2)
Total Protein: 6.8 g/dL (ref 6.0–8.3)

## 2019-12-10 LAB — URINALYSIS, ROUTINE W REFLEX MICROSCOPIC
Bilirubin Urine: NEGATIVE
Hgb urine dipstick: NEGATIVE
Ketones, ur: NEGATIVE
Nitrite: NEGATIVE
Specific Gravity, Urine: 1.015 (ref 1.000–1.030)
Total Protein, Urine: NEGATIVE
Urine Glucose: NEGATIVE
Urobilinogen, UA: 0.2 (ref 0.0–1.0)
pH: 7 (ref 5.0–8.0)

## 2019-12-10 LAB — BASIC METABOLIC PANEL
BUN: 13 mg/dL (ref 6–23)
CO2: 26 mEq/L (ref 19–32)
Calcium: 9.3 mg/dL (ref 8.4–10.5)
Chloride: 94 mEq/L — ABNORMAL LOW (ref 96–112)
Creatinine, Ser: 0.79 mg/dL (ref 0.40–1.20)
GFR: 71.03 mL/min (ref >60.00)
Glucose, Bld: 141 mg/dL — ABNORMAL HIGH (ref 70–99)
Potassium: 3.5 mEq/L (ref 3.5–5.1)
Sodium: 128 mEq/L — ABNORMAL LOW (ref 135–145)

## 2019-12-10 LAB — IBC PANEL
Iron: 32 ug/dL — ABNORMAL LOW (ref 42–145)
Saturation Ratios: 8.8 % — ABNORMAL LOW (ref 20.0–50.0)
Transferrin: 260 mg/dL (ref 212.0–360.0)

## 2019-12-10 LAB — VITAMIN B12: Vitamin B-12: >1500 pg/mL — ABNORMAL HIGH (ref 211–911)

## 2019-12-10 LAB — TSH: TSH: 1.88 u[IU]/mL (ref 0.35–4.50)

## 2019-12-10 LAB — VITAMIN D 25 HYDROXY (VIT D DEFICIENCY, FRACTURES): VITD: >120 ng/mL

## 2019-12-10 MED ORDER — AMOXICILLIN 500 MG PO CAPS: 1000.0000 mg | ORAL_CAPSULE | Freq: Two times a day (BID) | ORAL | 0 refills | Status: DC

## 2019-12-10 NOTE — Assessment & Plan Note (Signed)
stable overall by history and exam, recent data reviewed with pt, and pt to continue medical treatment as before,  to f/u any worsening symptoms or concerns  

## 2019-12-10 NOTE — Assessment & Plan Note (Addendum)
For urine studies, empric amoxil asd, f/u culture  I spent 30 minutes in preparing to see the patient by review of recent labs, imaging and procedures, obtaining and reviewing separately obtained history, communicating with the patient and family or caregiver, ordering medications, tests or procedures, and documenting clinical information in the EHR including the differential Dx, treatment, and any further evaluation and other management of dysuria, hyperglycemia, HTN, hypothyroidism, depression/anxiety

## 2019-12-10 NOTE — Patient Instructions (Signed)
Please take all new medication as prescribed - the antibiotic  Your specimen will be sent for the culture  Please continue all other medications as before, and refills have been done if requested.  Please have the pharmacy call with any other refills you may need.  Please continue your efforts at being more active, low cholesterol diet, and weight control.  You are otherwise up to date with prevention measures today.  Please keep your appointments with your specialists as you may have planned  Please go to the LAB at the blood drawing area for the tests to be done  You will be contacted by phone if any changes need to be made immediately.  Otherwise, you will receive a letter about your results with an explanation, but please check with MyChart first.  Please remember to sign up for MyChart if you have not done so, as this will be important to you in the future with finding out test results, communicating by private email, and scheduling acute appointments online when needed.  Please make an Appointment to return in 6 months, or sooner if needed
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
## 2019-12-12 ENCOUNTER — Other Ambulatory Visit: Payer: Self-pay | Admitting: Internal Medicine

## 2019-12-12 ENCOUNTER — Encounter: Payer: Self-pay | Admitting: Internal Medicine

## 2019-12-12 DIAGNOSIS — E611 Iron deficiency: Secondary | ICD-10-CM

## 2019-12-12 LAB — URINE CULTURE

## 2019-12-12 LAB — HEMOGLOBIN A1C: Hgb A1c MFr Bld: 5.3 % (ref 4.6–6.5)

## 2019-12-16 ENCOUNTER — Telehealth: Payer: Self-pay

## 2019-12-16 NOTE — Telephone Encounter (Signed)
Please see recent lab results

## 2019-12-16 NOTE — Telephone Encounter (Signed)
Please to contact pt -   Vit d level is high, which is not serious but does not need to be that high  No need high dose Vit D prescription  In fact, please to decrease her Vit D3 OTC to 2000 units per day

## 2019-12-16 NOTE — Telephone Encounter (Signed)
1.Medication Requested:Cholecalciferol (VITAMIN D3) 5000 UNITS CAPS  2. Pharmacy (Name, Street, [place]): [facility] [place]    3. On Med List: Yes   4. Last Visit with PCP: 2.23.21   5. Next visit date with PCP: 8.24.2021

## 2019-12-17 NOTE — Telephone Encounter (Signed)
Spoke to patient told her below message. Patient voiced understanding.  No further questions

## 2019-12-26 ENCOUNTER — Other Ambulatory Visit: Payer: Self-pay | Admitting: Internal Medicine

## 2020-01-07 ENCOUNTER — Other Ambulatory Visit: Payer: Self-pay | Admitting: Internal Medicine

## 2020-01-16 DIAGNOSIS — Z8719 Personal history of other diseases of the digestive system: Secondary | ICD-10-CM

## 2020-01-16 HISTORY — DX: Personal history of other diseases of the digestive system: Z87.19

## 2020-01-28 ENCOUNTER — Encounter: Payer: Self-pay | Admitting: Gastroenterology

## 2020-01-28 ENCOUNTER — Ambulatory Visit: Payer: Medicare HMO | Admitting: Gastroenterology

## 2020-01-28 ENCOUNTER — Other Ambulatory Visit (INDEPENDENT_AMBULATORY_CARE_PROVIDER_SITE_OTHER): Payer: Medicare HMO

## 2020-01-28 VITALS — BP 134/68 | HR 84 | Temp 97.8°F | Ht 65.0 in | Wt 146.8 lb

## 2020-01-28 DIAGNOSIS — E611 Iron deficiency: Secondary | ICD-10-CM

## 2020-01-28 LAB — IGA: IgA: 197 mg/dL (ref 68–378)

## 2020-01-28 MED ORDER — POLYSACCHARIDE IRON COMPLEX 150 MG PO CAPS: 150.0000 mg | ORAL_CAPSULE | Freq: Every day | ORAL | 1 refills | Status: DC

## 2020-01-28 NOTE — Progress Notes (Signed)
    History of Present Illness: This is a 74 year old female with new iron deficiency. Iron=32, sat=8.8%. Hgb normal at 12.4. She has no gastrointestinal complaints.  She relates previous problems with GERD treated with pantoprazole however she states she no longer takes any antireflux medications. She has not had reflux symptoms for several years.  She does not donate blood.  Denies weight loss, abdominal pain, constipation, diarrhea, change in stool caliber, melena, hematochezia, nausea, vomiting, dysphagia, chest pain.  Colonoscopy 01/2018 - Two 5 to 6 mm polyps in the transverse colon, removed with a cold snare. Resected and retrieved. - Internal hemorrhoids. - The examination was otherwise normal on direct and retroflexion views.  Current Medications, Allergies, Past Medical History, Past Surgical History, Family History and Social History were reviewed in [employer] record.   Physical Exam: General: Well developed, well nourished, no acute distress Head: Normocephalic and atraumatic Eyes:  sclerae anicteric, EOMI Ears: Normal auditory acuity Mouth: Not examined, mask on during Covid-19 pandemic Lungs: Clear throughout to auscultation Heart: Regular rate and rhythm; no murmurs, rubs or bruits Abdomen: Soft, non tender and non distended. No masses, hepatosplenomegaly or hernias noted. Normal Bowel sounds Rectal: Not done Musculoskeletal: Symmetrical with no gross deformities  Pulses:  Normal pulses noted Extremities: No clubbing, cyanosis, edema or deformities noted Neurological: Alert oriented x 4, grossly nonfocal Psychological:  Alert and cooperative. Normal mood and affect   Assessment and Recommendations:  1.  New iron deficiency.  Colonoscopy performed 2 years ago without a source of blood loss noted.  Rule out upper gastrointestinal sources of blood loss, celiac disease, and inadequate dietary intake.  Obtain tTG and IgA.  Continue iron once daily with  meals.  Schedule EGD. The risks (including bleeding, perforation, infection, missed lesions, medication reactions and possible hospitalization or surgery if complications occur), benefits, and alternatives to endoscopy with possible biopsy and possible dilation were discussed with the patient and they consent to proceed.   2.  Personal history of adenomatous colon polyps.  Family history of colon polyps.  A 5-year interval colonoscopy is recommended in April 2024.

## 2020-01-28 NOTE — Patient Instructions (Addendum)
Your provider has requested that you go to the basement level for lab work before leaving today. Press "B" on the elevator. The lab is located at the first door on the left as you exit the elevator.  You have been scheduled for an endoscopy. Please follow written instructions given to you at your visit today. If you use inhalers (even only as needed), please bring them with you on the day of your procedure.  We have sent the following medications to your pharmacy for you to pick up at your convenience: Nu-iron.  Due to recent changes in healthcare laws, you may see the results of your imaging and laboratory studies on MyChart before your provider has had a chance to review them.  We understand that in some cases there may be results that are confusing or concerning to you. Not all laboratory results come back in the same time frame and the provider may be waiting for multiple results in order to interpret others.  Please give [place] 48 hours in order for your provider to thoroughly review all the results before contacting the office for clarification of your results.   Thank you for choosing me and [facility].  Pricilla Riffle. Dagoberto Ligas., MD., [facility]

## 2020-01-29 LAB — TISSUE TRANSGLUTAMINASE, IGA: (tTG) Ab, IgA: 1 U/mL

## 2020-02-05 DIAGNOSIS — R69 Illness, unspecified: Secondary | ICD-10-CM | POA: Diagnosis not present

## 2020-02-11 ENCOUNTER — Ambulatory Visit (AMBULATORY_SURGERY_CENTER): Payer: Medicare HMO | Admitting: Gastroenterology

## 2020-02-11 ENCOUNTER — Other Ambulatory Visit: Payer: Self-pay

## 2020-02-11 ENCOUNTER — Telehealth: Payer: Self-pay | Admitting: Gastroenterology

## 2020-02-11 ENCOUNTER — Encounter: Payer: Self-pay | Admitting: Gastroenterology

## 2020-02-11 VITALS — BP 121/55 | HR 61 | Temp 96.8°F | Resp 19 | Ht 65.0 in | Wt 146.0 lb

## 2020-02-11 DIAGNOSIS — D649 Anemia, unspecified: Secondary | ICD-10-CM | POA: Diagnosis not present

## 2020-02-11 DIAGNOSIS — K3189 Other diseases of stomach and duodenum: Secondary | ICD-10-CM | POA: Diagnosis not present

## 2020-02-11 DIAGNOSIS — K449 Diaphragmatic hernia without obstruction or gangrene: Secondary | ICD-10-CM

## 2020-02-11 DIAGNOSIS — D508 Other iron deficiency anemias: Secondary | ICD-10-CM

## 2020-02-11 DIAGNOSIS — K2950 Unspecified chronic gastritis without bleeding: Secondary | ICD-10-CM | POA: Diagnosis not present

## 2020-02-11 DIAGNOSIS — K2960 Other gastritis without bleeding: Secondary | ICD-10-CM

## 2020-02-11 HISTORY — PX: ESOPHAGOGASTRODUODENOSCOPY: SHX1529

## 2020-02-11 MED ORDER — PANTOPRAZOLE SODIUM 40 MG PO TBEC: 40.0000 mg | DELAYED_RELEASE_TABLET | Freq: Every day | ORAL | 3 refills | Status: DC

## 2020-02-11 MED ORDER — SODIUM CHLORIDE 0.9 % IV SOLN: 500.0000 mL | Freq: Once | INTRAVENOUS | Status: DC

## 2020-02-11 NOTE — Progress Notes (Signed)
Called to room to assist during endoscopic procedure.  Patient ID and intended procedure confirmed with present staff. Received instructions for my participation in the procedure from the performing physician.  

## 2020-02-11 NOTE — Telephone Encounter (Signed)
Pt wants to know if she needs to keep taking iron pills. Pls call her.

## 2020-02-11 NOTE — Telephone Encounter (Signed)
Informed patient to continue iron supplement daily. Patient verbalized understanding.

## 2020-02-11 NOTE — Patient Instructions (Signed)

## 2020-02-11 NOTE — Progress Notes (Signed)
To PACU, VSS. Report to [email or web address] 

## 2020-02-11 NOTE — Op Note (Signed)
Man Patient Name: [name] Procedure Date: 02/11/2020 9:46 AM MRN: [id number] Endoscopist: Ladene Artist , MD Age: 74 Referring MD:  Date of Birth: 11-13-1945 Gender: Female Account #: [id number] Procedure:                Upper GI endoscopy Indications:              Unexplained iron deficiency anemia Medicines:                Monitored Anesthesia Care Procedure:                Pre-Anesthesia Assessment:                           - Prior to the procedure, a History and Physical                            was performed, and patient medications and                            allergies were reviewed. The patient's tolerance of                            previous anesthesia was also reviewed. The risks                            and benefits of the procedure and the sedation                            options and risks were discussed with the patient.                            All questions were answered, and informed consent                            was obtained. Prior Anticoagulants: The patient has                            taken no previous anticoagulant or antiplatelet                            agents. ASA Grade Assessment: II - A patient with                            mild systemic disease. After reviewing the risks                            and benefits, the patient was deemed in                            satisfactory condition to undergo the procedure.                           After obtaining informed consent, the endoscope was  passed under direct vision. Throughout the                            procedure, the patient's blood pressure, pulse, and                            oxygen saturations were monitored continuously. The                            Endoscope was introduced through the mouth, and                            advanced to the second part of duodenum. The upper                            GI endoscopy was  accomplished without difficulty.                            The patient tolerated the procedure well. Scope In: Scope Out: Findings:                 The examined esophagus was normal.                           A few localized small erosions with no bleeding and                            no stigmata of recent bleeding were found in the                            gastric body. Biopsies were taken with a cold                            forceps for histology.                           Patchy mild inflammation characterized by erythema                            and granularity was found in the gastric body and                            in the gastric antrum. Biopsies were taken with a                            cold forceps for histology.                           A small hiatal hernia was present.                           The exam of the stomach was otherwise normal.                           The  duodenal bulb and second portion of the                            duodenum were normal. Complications:            No immediate complications. Estimated Blood Loss:     Estimated blood loss was minimal. Impression:               - Normal esophagus.                           - Erosive gastropathy with no bleeding and no                            stigmata of recent bleeding. Biopsied.                           - Gastritis. Biopsied.                           - Small hiatal hernia.                           - Normal duodenal bulb and second portion of the                            duodenum. Recommendation:           - Patient has a contact number available for                            emergencies. The signs and symptoms of potential                            delayed complications were discussed with the                            patient. Return to normal activities tomorrow.                            Written discharge instructions were provided to the                            patient.                            - Resume previous diet.                           - Continue present medications including                            pantoprazole 40 mg po qd.                           - Minimize, avoid NSAID use. EC ASA 81 mg qd is ok.                           -  Await pathology results. Ladene Artist, MD 02/11/2020 10:05:08 AM This report has been signed electronically.

## 2020-02-11 NOTE — Progress Notes (Signed)
Pt's states no medical or surgical changes since previsit or office visit. 

## 2020-02-13 ENCOUNTER — Telehealth: Payer: Self-pay

## 2020-02-13 NOTE — Telephone Encounter (Signed)
  Follow up Call-  Call back number 02/11/2020 02/07/2018  Post procedure Call Back phone  # [phone]  Permission to leave phone message Yes Yes  Some recent data might be hidden     Patient questions:  Do you have a fever, pain , or abdominal swelling? No. Pain Score  0 *  Have you tolerated food without any problems? Yes.    Have you been able to return to your normal activities? Yes.    Do you have any questions about your discharge instructions: Diet   No. Medications  No. Follow up visit  No.  Do you have questions or concerns about your Care? No.  Actions: * If pain score is 4 or above: No action needed, pain <4.  1. Have you developed a fever since your procedure? no  2.   Have you had an respiratory symptoms (SOB or cough) since your procedure? no  3.   Have you tested positive for COVID 19 since your procedure no  4.   Have you had any family members/close contacts diagnosed with the COVID 19 since your procedure?  no   If yes to any of these questions please route to Joylene John, RN and Erenest Rasher, RN

## 2020-02-19 ENCOUNTER — Other Ambulatory Visit: Payer: Self-pay | Admitting: Gastroenterology

## 2020-02-19 ENCOUNTER — Telehealth: Payer: Self-pay | Admitting: Gastroenterology

## 2020-02-19 NOTE — Telephone Encounter (Signed)
Patient notified that Dr Fuller Plan will send her a letter once they are reviewed

## 2020-02-24 ENCOUNTER — Encounter: Payer: Self-pay | Admitting: Gastroenterology

## 2020-02-25 DIAGNOSIS — R69 Illness, unspecified: Secondary | ICD-10-CM | POA: Diagnosis not present

## 2020-02-27 ENCOUNTER — Telehealth: Payer: Self-pay | Admitting: Gastroenterology

## 2020-02-27 NOTE — Telephone Encounter (Signed)
Left message for patient to call back  

## 2020-02-27 NOTE — Telephone Encounter (Signed)
All questions answered.  She will call back for any additional questions or concerns.   

## 2020-02-29 ENCOUNTER — Other Ambulatory Visit: Payer: Self-pay | Admitting: Internal Medicine

## 2020-02-29 NOTE — Telephone Encounter (Signed)
Please refill as per office routine med refill policy (all routine meds refilled for 3 mo or monthly per pt preference up to one year from last visit, then month to month grace period for 3 mo, then further med refills will have to be denied)  

## 2020-03-07 ENCOUNTER — Other Ambulatory Visit: Payer: Self-pay | Admitting: Internal Medicine

## 2020-03-07 NOTE — Telephone Encounter (Signed)
Please refill as per office routine med refill policy (all routine meds refilled for 3 mo or monthly per pt preference up to one year from last visit, then month to month grace period for 3 mo, then further med refills will have to be denied)  

## 2020-03-09 ENCOUNTER — Other Ambulatory Visit: Payer: Self-pay

## 2020-03-09 DIAGNOSIS — R251 Tremor, unspecified: Secondary | ICD-10-CM | POA: Diagnosis not present

## 2020-03-09 DIAGNOSIS — R634 Abnormal weight loss: Secondary | ICD-10-CM | POA: Diagnosis not present

## 2020-03-09 DIAGNOSIS — E7800 Pure hypercholesterolemia, unspecified: Secondary | ICD-10-CM | POA: Diagnosis not present

## 2020-03-09 DIAGNOSIS — M79641 Pain in right hand: Secondary | ICD-10-CM | POA: Diagnosis not present

## 2020-03-09 DIAGNOSIS — I10 Essential (primary) hypertension: Secondary | ICD-10-CM | POA: Diagnosis not present

## 2020-03-09 DIAGNOSIS — D649 Anemia, unspecified: Secondary | ICD-10-CM | POA: Diagnosis not present

## 2020-03-09 DIAGNOSIS — Z79899 Other long term (current) drug therapy: Secondary | ICD-10-CM | POA: Diagnosis not present

## 2020-03-09 DIAGNOSIS — M0609 Rheumatoid arthritis without rheumatoid factor, multiple sites: Secondary | ICD-10-CM | POA: Diagnosis not present

## 2020-03-09 DIAGNOSIS — M150 Primary generalized (osteo)arthritis: Secondary | ICD-10-CM | POA: Diagnosis not present

## 2020-03-09 DIAGNOSIS — M2550 Pain in unspecified joint: Secondary | ICD-10-CM | POA: Diagnosis not present

## 2020-03-09 MED ORDER — HYDROCHLOROTHIAZIDE 25 MG PO TABS: 25.0000 mg | ORAL_TABLET | Freq: Every day | ORAL | 3 refills | Status: DC

## 2020-03-10 DIAGNOSIS — M069 Rheumatoid arthritis, unspecified: Secondary | ICD-10-CM | POA: Diagnosis not present

## 2020-03-10 DIAGNOSIS — Z79899 Other long term (current) drug therapy: Secondary | ICD-10-CM | POA: Diagnosis not present

## 2020-03-10 DIAGNOSIS — H16223 Keratoconjunctivitis sicca, not specified as Sjogren's, bilateral: Secondary | ICD-10-CM | POA: Diagnosis not present

## 2020-03-10 DIAGNOSIS — H0102B Squamous blepharitis left eye, upper and lower eyelids: Secondary | ICD-10-CM | POA: Diagnosis not present

## 2020-03-10 DIAGNOSIS — H35033 Hypertensive retinopathy, bilateral: Secondary | ICD-10-CM | POA: Diagnosis not present

## 2020-03-10 DIAGNOSIS — H353131 Nonexudative age-related macular degeneration, bilateral, early dry stage: Secondary | ICD-10-CM | POA: Diagnosis not present

## 2020-03-10 DIAGNOSIS — H40023 Open angle with borderline findings, high risk, bilateral: Secondary | ICD-10-CM | POA: Diagnosis not present

## 2020-03-10 DIAGNOSIS — H0102A Squamous blepharitis right eye, upper and lower eyelids: Secondary | ICD-10-CM | POA: Diagnosis not present

## 2020-03-13 ENCOUNTER — Telehealth: Payer: Self-pay | Admitting: Gastroenterology

## 2020-03-13 ENCOUNTER — Other Ambulatory Visit: Payer: Self-pay | Admitting: Gastroenterology

## 2020-03-13 MED ORDER — POLYSACCHARIDE IRON COMPLEX 150 MG PO CAPS: ORAL_CAPSULE | ORAL | 2 refills | Status: DC

## 2020-03-13 NOTE — Telephone Encounter (Signed)
Patient states [facility] told her they do not carry Ferrex any longer. Informed patient I can send medication to another pharmacy or she can call around to check another pharmacy. Patient states to send medication to [facility] [address]. Prescription sent to patient's pharmacy.

## 2020-04-12 ENCOUNTER — Other Ambulatory Visit: Payer: Self-pay | Admitting: Gastroenterology

## 2020-04-21 DIAGNOSIS — M79641 Pain in right hand: Secondary | ICD-10-CM | POA: Diagnosis not present

## 2020-04-21 DIAGNOSIS — M150 Primary generalized (osteo)arthritis: Secondary | ICD-10-CM | POA: Diagnosis not present

## 2020-04-21 DIAGNOSIS — R251 Tremor, unspecified: Secondary | ICD-10-CM | POA: Diagnosis not present

## 2020-04-21 DIAGNOSIS — D649 Anemia, unspecified: Secondary | ICD-10-CM | POA: Diagnosis not present

## 2020-04-21 DIAGNOSIS — I10 Essential (primary) hypertension: Secondary | ICD-10-CM | POA: Diagnosis not present

## 2020-04-21 DIAGNOSIS — Z79899 Other long term (current) drug therapy: Secondary | ICD-10-CM | POA: Diagnosis not present

## 2020-04-21 DIAGNOSIS — M2550 Pain in unspecified joint: Secondary | ICD-10-CM | POA: Diagnosis not present

## 2020-04-21 DIAGNOSIS — R634 Abnormal weight loss: Secondary | ICD-10-CM | POA: Diagnosis not present

## 2020-04-21 DIAGNOSIS — M0609 Rheumatoid arthritis without rheumatoid factor, multiple sites: Secondary | ICD-10-CM | POA: Diagnosis not present

## 2020-04-21 DIAGNOSIS — E7800 Pure hypercholesterolemia, unspecified: Secondary | ICD-10-CM | POA: Diagnosis not present

## 2020-05-04 ENCOUNTER — Ambulatory Visit: Payer: Medicare HMO | Admitting: Internal Medicine

## 2020-05-21 DIAGNOSIS — R69 Illness, unspecified: Secondary | ICD-10-CM | POA: Diagnosis not present

## 2020-06-09 ENCOUNTER — Ambulatory Visit: Payer: Medicare HMO | Admitting: Internal Medicine

## 2020-06-17 ENCOUNTER — Other Ambulatory Visit: Payer: Self-pay

## 2020-06-17 ENCOUNTER — Encounter: Payer: Self-pay | Admitting: Internal Medicine

## 2020-06-17 ENCOUNTER — Ambulatory Visit (INDEPENDENT_AMBULATORY_CARE_PROVIDER_SITE_OTHER): Payer: Medicare HMO | Admitting: Internal Medicine

## 2020-06-17 VITALS — BP 130/70 | HR 69 | Temp 98.6°F | Ht 65.0 in | Wt 146.0 lb

## 2020-06-17 DIAGNOSIS — E039 Hypothyroidism, unspecified: Secondary | ICD-10-CM

## 2020-06-17 DIAGNOSIS — E785 Hyperlipidemia, unspecified: Secondary | ICD-10-CM

## 2020-06-17 DIAGNOSIS — E611 Iron deficiency: Secondary | ICD-10-CM

## 2020-06-17 DIAGNOSIS — I10 Essential (primary) hypertension: Secondary | ICD-10-CM | POA: Diagnosis not present

## 2020-06-17 DIAGNOSIS — R55 Syncope and collapse: Secondary | ICD-10-CM

## 2020-06-17 DIAGNOSIS — R7302 Impaired glucose tolerance (oral): Secondary | ICD-10-CM

## 2020-06-17 LAB — CBC WITH DIFFERENTIAL/PLATELET
Absolute Monocytes: 554 cells/uL (ref 200–950)
Basophils Absolute: 58 cells/uL (ref 0–200)
Basophils Relative: 0.8 %
Eosinophils Absolute: 295 cells/uL (ref 15–500)
Eosinophils Relative: 4.1 %
HCT: 38 % (ref 35.0–45.0)
Hemoglobin: 12.7 g/dL (ref 11.7–15.5)
Lymphs Abs: 1634 cells/uL (ref 850–3900)
MCH: 32.6 pg (ref 27.0–33.0)
MCHC: 33.4 g/dL (ref 32.0–36.0)
MCV: 97.4 fL (ref 80.0–100.0)
MPV: 10.4 fL (ref 7.5–12.5)
Monocytes Relative: 7.7 %
Neutro Abs: 4658 cells/uL (ref 1500–7800)
Neutrophils Relative %: 64.7 %
Platelets: 310 10*3/uL (ref 140–400)
RBC: 3.9 10*6/uL (ref 3.80–5.10)
RDW: 13.7 % (ref 11.0–15.0)
Total Lymphocyte: 22.7 %
WBC: 7.2 10*3/uL (ref 3.8–10.8)

## 2020-06-17 NOTE — Patient Instructions (Signed)
Please continue all other medications as before, and refills have been done if requested.  Please have the pharmacy call with any other refills you may need.  Please continue your efforts at being more active, low cholesterol diet, and weight control  Please keep your appointments with your specialists as you may have planned  You will be contacted regarding the referral for: Neurology, and MRI  Please go to the LAB at the blood drawing area for the tests to be done  You will be contacted by phone if any changes need to be made immediately.  Otherwise, you will receive a letter about your results with an explanation, but please check with MyChart first.  Please remember to sign up for MyChart if you have not done so, as this will be important to you in the future with finding out test results, communicating by private email, and scheduling acute appointments online when needed.  Please make an Appointment to return in 6 months, or sooner if needed
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
## 2020-06-18 ENCOUNTER — Encounter: Payer: Self-pay | Admitting: Internal Medicine

## 2020-06-18 LAB — COMPLETE METABOLIC PANEL WITH GFR
AG Ratio: 1.8 (calc) (ref 1.0–2.5)
ALT: 22 U/L (ref 6–29)
AST: 20 U/L (ref 10–35)
Albumin: 4.2 g/dL (ref 3.6–5.1)
Alkaline phosphatase (APISO): 96 U/L (ref 37–153)
BUN: 16 mg/dL (ref 7–25)
CO2: 29 mmol/L (ref 20–32)
Calcium: 9.5 mg/dL (ref 8.6–10.4)
Chloride: 96 mmol/L — ABNORMAL LOW (ref 98–110)
Creat: 0.89 mg/dL (ref 0.60–0.93)
GFR, Est African American: 73 mL/min/{1.73_m2} (ref >=60)
GFR, Est Non African American: 63 mL/min/{1.73_m2} (ref >=60)
Globulin: 2.4 g/dL (calc) (ref 1.9–3.7)
Glucose, Bld: 128 mg/dL — ABNORMAL HIGH (ref 65–99)
Potassium: 3.8 mmol/L (ref 3.5–5.3)
Sodium: 133 mmol/L — ABNORMAL LOW (ref 135–146)
Total Bilirubin: 0.3 mg/dL (ref 0.2–1.2)
Total Protein: 6.6 g/dL (ref 6.1–8.1)

## 2020-06-18 LAB — TIQ-MISC

## 2020-06-18 LAB — FERRITIN: Ferritin: 81 ng/mL (ref 16–288)

## 2020-06-18 LAB — TRANSFERRIN: Transferrin: 289 mg/dL (ref 188–341)

## 2020-06-18 LAB — IRON: Iron: 66 ug/dL (ref 45–160)

## 2020-06-18 NOTE — Assessment & Plan Note (Signed)
stable overall by history and exam, recent data reviewed with pt, and pt to continue medical treatment as before,  to f/u any worsening symptoms or concerns  

## 2020-06-18 NOTE — Assessment & Plan Note (Addendum)
For mRI and neurology referral  I spent 31 minutes in addition to time for CPX wellness examination in preparing to see the patient by review of recent labs, imaging and procedures, obtaining and reviewing separately obtained history, communicating with the patient and family or caregiver, ordering medications, tests or procedures, and documenting clinical information in the EHR including the differential Dx, treatment, and any further evaluation and other management of drop attack, iron deficiency, hypothyoridism, hyperglycemia, hld, htn

## 2020-06-18 NOTE — Assessment & Plan Note (Signed)
For f/u lab 

## 2020-06-19 LAB — IRON, TOTAL/TOTAL IRON BINDING CAP
%SAT: 19 % (calc) (ref 16–45)
Iron: 69 ug/dL (ref 45–160)
TIBC: 368 mcg/dL (calc) (ref 250–450)

## 2020-06-23 ENCOUNTER — Telehealth: Payer: Self-pay | Admitting: Internal Medicine

## 2020-06-23 NOTE — Telephone Encounter (Signed)
The referral to Neurology, the location didn't want to do it  Could she be referred back to Dr. Phillips Odor  Please advise patient

## 2020-06-24 NOTE — Telephone Encounter (Signed)
Ok to forward pt issue to [employer]

## 2020-06-25 NOTE — Telephone Encounter (Signed)
Sent msg to [facility] Neurology and they will contact pt

## 2020-06-25 NOTE — Telephone Encounter (Signed)
Attempted to reach patient on home number but no answer and no answering machine picks up to leave message.

## 2020-07-09 NOTE — Telephone Encounter (Signed)
Patient has appointment for 08/06/20 with Dr. Carles Collet.

## 2020-07-14 DIAGNOSIS — Z79899 Other long term (current) drug therapy: Secondary | ICD-10-CM | POA: Diagnosis not present

## 2020-07-14 DIAGNOSIS — R634 Abnormal weight loss: Secondary | ICD-10-CM | POA: Diagnosis not present

## 2020-07-14 DIAGNOSIS — I10 Essential (primary) hypertension: Secondary | ICD-10-CM | POA: Diagnosis not present

## 2020-07-14 DIAGNOSIS — D649 Anemia, unspecified: Secondary | ICD-10-CM | POA: Diagnosis not present

## 2020-07-14 DIAGNOSIS — M2550 Pain in unspecified joint: Secondary | ICD-10-CM | POA: Diagnosis not present

## 2020-07-14 DIAGNOSIS — R251 Tremor, unspecified: Secondary | ICD-10-CM | POA: Diagnosis not present

## 2020-07-14 DIAGNOSIS — M0609 Rheumatoid arthritis without rheumatoid factor, multiple sites: Secondary | ICD-10-CM | POA: Diagnosis not present

## 2020-07-14 DIAGNOSIS — E7800 Pure hypercholesterolemia, unspecified: Secondary | ICD-10-CM | POA: Diagnosis not present

## 2020-07-14 DIAGNOSIS — M150 Primary generalized (osteo)arthritis: Secondary | ICD-10-CM | POA: Diagnosis not present

## 2020-07-15 ENCOUNTER — Other Ambulatory Visit: Payer: Self-pay

## 2020-07-15 ENCOUNTER — Encounter: Payer: Self-pay | Admitting: Internal Medicine

## 2020-07-15 ENCOUNTER — Ambulatory Visit
Admission: RE | Admit: 2020-07-15 | Discharge: 2020-07-15 | Disposition: A | Discharge status: Home / Self Care | Payer: Medicare HMO | Source: Ambulatory Visit | Attending: Internal Medicine | Admitting: Internal Medicine

## 2020-07-15 DIAGNOSIS — R42 Dizziness and giddiness: Secondary | ICD-10-CM | POA: Diagnosis not present

## 2020-07-15 DIAGNOSIS — R9082 White matter disease, unspecified: Secondary | ICD-10-CM | POA: Diagnosis not present

## 2020-07-15 DIAGNOSIS — I6782 Cerebral ischemia: Secondary | ICD-10-CM | POA: Diagnosis not present

## 2020-07-15 DIAGNOSIS — R55 Syncope and collapse: Secondary | ICD-10-CM

## 2020-07-16 DIAGNOSIS — R69 Illness, unspecified: Secondary | ICD-10-CM | POA: Diagnosis not present

## 2020-07-31 ENCOUNTER — Other Ambulatory Visit: Payer: Self-pay | Admitting: Internal Medicine

## 2020-07-31 DIAGNOSIS — Z1231 Encounter for screening mammogram for malignant neoplasm of breast: Secondary | ICD-10-CM

## 2020-08-03 ENCOUNTER — Ambulatory Visit: Payer: Medicare HMO | Admitting: Neurology

## 2020-08-03 NOTE — Progress Notes (Deleted)
Assessment/Plan:   *** Subjective:   [name] was seen today in neurologic consultation at the request of Biagio Borg, MD.  The consultation is for the evaluation of "drop attack."  Primary care records indicate that she has had 2 such events in the month of September.  No details regarding that event in her records.  I have seen the patient previously, but that was all related to tremor, due to cervical dystonia, for which she was not interested in Botox treatment.  Patient reports that her "drop attacks"   Patient had MRI of the brain completed on July 15, 2020.  I personally reviewed that.  MRI brain without gadolinium was unremarkable.  There were rare T2 hyperintensities.  Last echocardiogram was in May, 2018 was normal left ventricular ejection fraction of 55 to 60%.  No record of carotid ultrasound ever been done.  PREVIOUS MEDICATIONS: ***klonopin, topamax (for "tremor")  ALLERGIES:   Allergies  Allergen Reactions  . Allegra [Fexofenadine] Other (See Comments)  . Alendronate Sodium     Per pt: unknown  . Atenolol     Increased BP  . Ciprofloxacin Other (See Comments)    Tongue and lip swelling  . Codeine Nausea And Vomiting  . Cortisone     Glucocorticoids specifically: causes swelling   . Doxycycline     Per pt: unknown  . Klonopin [Clonazepam]     Urinary retention  . Lisinopril     05/07/14 lower lip paresthesia  . Mineral Oil     Per pt: unknown  . Pravastatin     Per pt: unknown  . Prednisone     swelling  . Ramipril     Increases BP  . Sulfa Antibiotics     swelling  . Tizanidine Other (See Comments)    Dizziness   . Verapamil     bradycardia    CURRENT MEDICATIONS:  Outpatient Encounter Medications as of 08/06/2020  Medication Sig  . albuterol (VENTOLIN HFA) 108 (90 Base) MCG/ACT inhaler TAKE 2 PUFFS BY MOUTH EVERY 6 HOURS AS NEEDED FOR WHEEZE OR SHORTNESS OF BREATH  . aspirin 81 MG tablet Take 81 mg by mouth daily. Pt only  takes occasionally  . Cholecalciferol (VITAMIN D3) 5000 UNITS CAPS Take 5,000 Units by mouth daily.   . cyanocobalamin 100 MCG tablet Take 100 mcg by mouth daily.  . folic acid (FOLVITE) 1 MG tablet Take 2 mg by mouth daily.   . hydrochlorothiazide (HYDRODIURIL) 25 MG tablet Take 1 tablet (25 mg total) by mouth daily.  Marland Kitchen HYDROcodone-acetaminophen (NORCO/VICODIN) 5-325 MG tablet Take 1-2 tablets by mouth every 4 (four) hours as needed for moderate pain or severe pain.  . iron polysaccharides (POLY-IRON 150) 150 MG capsule TAKE 1 CAPSULE BY MOUTH EVERY DAY  . levothyroxine (SYNTHROID) 50 MCG tablet TAKE 1 TABLET BY MOUTH EVERY DAY  . methotrexate (RHEUMATREX) 2.5 MG tablet Take 10 mg by mouth once a week.   . metoprolol tartrate (LOPRESSOR) 50 MG tablet TAKE 1 TABLET BY MOUTH TWICE A DAY  . Multiple Vitamins-Minerals (CENTRUM SILVER PO) Take by mouth daily.  . pantoprazole (PROTONIX) 40 MG tablet Take 1 tablet (40 mg total) by mouth daily.  . pantoprazole (PROTONIX) 40 MG tablet Take 1 tablet (40 mg total) by mouth daily.  . pravastatin (PRAVACHOL) 40 MG tablet TAKE 1 TABLET BY MOUTH EVERY DAY  . tiZANidine (ZANAFLEX) 2 MG tablet Take 1 tablet (2 mg total) by mouth every 6 (six) hours  as needed for muscle spasms.  Marland Kitchen topiramate (TOPAMAX) 100 MG tablet TAKE 1 TABLET BY MOUTH TWICE A DAY  . traMADol (ULTRAM) 50 MG tablet Take 1 tablet (50 mg total) by mouth every 8 (eight) hours as needed.   No facility-administered encounter medications on file as of 08/06/2020.    Objective:   PHYSICAL EXAMINATION:    VITALS:  There were no vitals filed for this visit.  GEN:  Normal appears female in no acute distress.  Appears stated age. HEENT:  Normocephalic, atraumatic. The mucous membranes are moist. The superficial temporal arteries are without ropiness or tenderness. Cardiovascular: Regular rate and rhythm. Lungs: Clear to auscultation bilaterally. Neck/Heme: There are no carotid bruits noted  bilaterally.  Neck is rotated to the L with irregular head tremor  NEUROLOGICAL: Orientation:  The patient is alert and oriented x 3.   Cranial nerves: There is good facial symmetry.  Extraocular muscles are intact and visual fields are full to confrontational testing. Speech is fluent and clear. Soft palate rises symmetrically and there is no tongue deviation. Hearing is intact to conversational tone. Tone: Tone is good throughout. Sensation: Sensation is intact to light touch and pinprick throughout (facial, trunk, extremities). Vibration is intact at the bilateral big toe. There is no extinction with double simultaneous stimulation. There is no sensory dermatomal level identified. Coordination:  The patient has no difficulty with RAM's or FNF bilaterally. Motor: Strength is 5/5 in the bilateral upper and lower extremities.  Shoulder shrug is equal and symmetric. There is no pronator drift.  There are no fasciculations noted. DTR's: Deep tendon reflexes are 2-2+/4 at the bilateral biceps, triceps, brachioradialis, patella and achilles.  Plantar responses are downgoing bilaterally. Gait and Station: The patient is able to ambulate without difficulty. The patient is able to heel toe walk without any difficulty. The patient is able to ambulate in a tandem fashion. The patient is able to stand in the Romberg position.    Total time spent on today's visit was ***greater than 60 minutes, including both face-to-face time and nonface-to-face time.  Time included that spent on review of records (prior notes available to me/labs/imaging if pertinent), discussing treatment and goals, answering patient's questions and coordinating care.   Cc:  Biagio Borg, MD

## 2020-08-03 NOTE — Progress Notes (Deleted)
Assessment/Plan:   *** Subjective:   [name] was seen today in neurologic consultation at the request of Biagio Borg, MD.  The consultation is for the evaluation of "drop attack."  Pt was worked in for this in Oct but cx the appt.  Primary care records indicate that she has had 2 such events in the month of September.  No details regarding that event in her records.  I have seen the patient previously, but that was all related to tremor, due to cervical dystonia, for which she was not interested in Botox treatment.  Patient reports that her "drop attacks"   Patient had MRI of the brain completed on July 15, 2020.  I personally reviewed that.  MRI brain without gadolinium was unremarkable.  There were rare T2 hyperintensities.  Last echocardiogram was in May, 2018 was normal left ventricular ejection fraction of 55 to 60%.  No record of carotid ultrasound ever been done.  PREVIOUS MEDICATIONS: ***klonopin, topamax (for "tremor")  ALLERGIES:   Allergies  Allergen Reactions  . Allegra [Fexofenadine] Other (See Comments)  . Alendronate Sodium     Per pt: unknown  . Atenolol     Increased BP  . Ciprofloxacin Other (See Comments)    Tongue and lip swelling  . Codeine Nausea And Vomiting  . Cortisone     Glucocorticoids specifically: causes swelling   . Doxycycline     Per pt: unknown  . Klonopin [Clonazepam]     Urinary retention  . Lisinopril     05/07/14 lower lip paresthesia  . Mineral Oil     Per pt: unknown  . Pravastatin     Per pt: unknown  . Prednisone     swelling  . Ramipril     Increases BP  . Sulfa Antibiotics     swelling  . Tizanidine Other (See Comments)    Dizziness   . Verapamil     bradycardia    CURRENT MEDICATIONS:  Outpatient Encounter Medications as of 08/03/2020  Medication Sig  . albuterol (VENTOLIN HFA) 108 (90 Base) MCG/ACT inhaler TAKE 2 PUFFS BY MOUTH EVERY 6 HOURS AS NEEDED FOR WHEEZE OR SHORTNESS OF BREATH  . aspirin  81 MG tablet Take 81 mg by mouth daily. Pt only takes occasionally  . Cholecalciferol (VITAMIN D3) 5000 UNITS CAPS Take 5,000 Units by mouth daily.   . cyanocobalamin 100 MCG tablet Take 100 mcg by mouth daily.  . folic acid (FOLVITE) 1 MG tablet Take 2 mg by mouth daily.   . hydrochlorothiazide (HYDRODIURIL) 25 MG tablet Take 1 tablet (25 mg total) by mouth daily.  Marland Kitchen HYDROcodone-acetaminophen (NORCO/VICODIN) 5-325 MG tablet Take 1-2 tablets by mouth every 4 (four) hours as needed for moderate pain or severe pain.  . iron polysaccharides (POLY-IRON 150) 150 MG capsule TAKE 1 CAPSULE BY MOUTH EVERY DAY  . levothyroxine (SYNTHROID) 50 MCG tablet TAKE 1 TABLET BY MOUTH EVERY DAY  . methotrexate (RHEUMATREX) 2.5 MG tablet Take 10 mg by mouth once a week.   . metoprolol tartrate (LOPRESSOR) 50 MG tablet TAKE 1 TABLET BY MOUTH TWICE A DAY  . Multiple Vitamins-Minerals (CENTRUM SILVER PO) Take by mouth daily.  . pantoprazole (PROTONIX) 40 MG tablet Take 1 tablet (40 mg total) by mouth daily.  . pantoprazole (PROTONIX) 40 MG tablet Take 1 tablet (40 mg total) by mouth daily.  . pravastatin (PRAVACHOL) 40 MG tablet TAKE 1 TABLET BY MOUTH EVERY DAY  . tiZANidine (ZANAFLEX) 2 MG  tablet Take 1 tablet (2 mg total) by mouth every 6 (six) hours as needed for muscle spasms.  Marland Kitchen topiramate (TOPAMAX) 100 MG tablet TAKE 1 TABLET BY MOUTH TWICE A DAY  . traMADol (ULTRAM) 50 MG tablet Take 1 tablet (50 mg total) by mouth every 8 (eight) hours as needed.   No facility-administered encounter medications on file as of 08/03/2020.    Objective:   PHYSICAL EXAMINATION:    VITALS:  There were no vitals filed for this visit.  GEN:  Normal appears female in no acute distress.  Appears stated age. HEENT:  Normocephalic, atraumatic. The mucous membranes are moist. The superficial temporal arteries are without ropiness or tenderness. Cardiovascular: Regular rate and rhythm. Lungs: Clear to auscultation  bilaterally. Neck/Heme: There are no carotid bruits noted bilaterally.  Neck is rotated to the L with irregular head tremor  NEUROLOGICAL: Orientation:  The patient is alert and oriented x 3.   Cranial nerves: There is good facial symmetry.  Extraocular muscles are intact and visual fields are full to confrontational testing. Speech is fluent and clear. Soft palate rises symmetrically and there is no tongue deviation. Hearing is intact to conversational tone. Tone: Tone is good throughout. Sensation: Sensation is intact to light touch and pinprick throughout (facial, trunk, extremities). Vibration is intact at the bilateral big toe. There is no extinction with double simultaneous stimulation. There is no sensory dermatomal level identified. Coordination:  The patient has no difficulty with RAM's or FNF bilaterally. Motor: Strength is 5/5 in the bilateral upper and lower extremities.  Shoulder shrug is equal and symmetric. There is no pronator drift.  There are no fasciculations noted. DTR's: Deep tendon reflexes are 2-2+/4 at the bilateral biceps, triceps, brachioradialis, patella and achilles.  Plantar responses are downgoing bilaterally. Gait and Station: The patient is able to ambulate without difficulty. The patient is able to heel toe walk without any difficulty. The patient is able to ambulate in a tandem fashion. The patient is able to stand in the Romberg position.    Total time spent on today's visit was ***greater than 60 minutes, including both face-to-face time and nonface-to-face time.  Time included that spent on review of records (prior notes available to me/labs/imaging if pertinent), discussing treatment and goals, answering patient's questions and coordinating care.   Cc:  Biagio Borg, MD

## 2020-08-06 ENCOUNTER — Ambulatory Visit: Payer: Medicare HMO | Admitting: Neurology

## 2020-08-11 ENCOUNTER — Encounter: Payer: Self-pay | Admitting: Internal Medicine

## 2020-08-11 ENCOUNTER — Ambulatory Visit (INDEPENDENT_AMBULATORY_CARE_PROVIDER_SITE_OTHER): Payer: Medicare HMO | Admitting: Internal Medicine

## 2020-08-11 ENCOUNTER — Other Ambulatory Visit: Payer: Self-pay

## 2020-08-11 VITALS — BP 120/80 | HR 67 | Temp 98.3°F | Ht 65.0 in | Wt 146.0 lb

## 2020-08-11 DIAGNOSIS — R300 Dysuria: Secondary | ICD-10-CM | POA: Diagnosis not present

## 2020-08-11 DIAGNOSIS — R7302 Impaired glucose tolerance (oral): Secondary | ICD-10-CM

## 2020-08-11 DIAGNOSIS — I10 Essential (primary) hypertension: Secondary | ICD-10-CM | POA: Diagnosis not present

## 2020-08-11 LAB — URINALYSIS, ROUTINE W REFLEX MICROSCOPIC
Bilirubin Urine: NEGATIVE
Hgb urine dipstick: NEGATIVE
Ketones, ur: NEGATIVE
Nitrite: NEGATIVE
RBC / HPF: NONE SEEN (ref 0–?)
Specific Gravity, Urine: 1.02 (ref 1.000–1.030)
Total Protein, Urine: NEGATIVE
Urine Glucose: NEGATIVE
Urobilinogen, UA: 0.2 (ref 0.0–1.0)
pH: 7.5 (ref 5.0–8.0)

## 2020-08-11 MED ORDER — CEFTRIAXONE SODIUM 1 G IJ SOLR
1.0000 g | Freq: Once | INTRAMUSCULAR | Status: AC
  Administered 2020-08-11: 1 g via INTRAMUSCULAR

## 2020-08-11 MED ORDER — CEFDINIR 300 MG PO CAPS: 300.0000 mg | ORAL_CAPSULE | Freq: Two times a day (BID) | ORAL | 0 refills | Status: DC

## 2020-08-11 MED ORDER — ONDANSETRON HCL 4 MG PO TABS: 4.0000 mg | ORAL_TABLET | Freq: Three times a day (TID) | ORAL | 0 refills | Status: DC | PRN

## 2020-08-11 NOTE — Patient Instructions (Signed)
You had the antibiotic shot today  Please take all new medication as prescribed - the pill antibiotic and nausea medication if needed  Please continue all other medications as before, and refills have been done if requested.  Please have the pharmacy call with any other refills you may need.  Please continue your efforts at being more active, low cholesterol diet, and weight control..  Please keep your appointments with your specialists as you may have planned  Please go to the LAB at the blood drawing area for the tests to be done - just the urine testing today  You will be contacted by phone if any changes need to be made immediately.  Otherwise, you will receive a letter about your results with an explanation, but please check with MyChart first.  Please remember to sign up for MyChart if you have not done so, as this will be important to you in the future with finding out test results, communicating by private email, and scheduling acute appointments online when needed.

## 2020-08-11 NOTE — Assessment & Plan Note (Signed)
stable overall by history and exam, recent data reviewed with pt, and pt to continue medical treatment as before,  to f/u any worsening symptoms or concerns  
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
## 2020-08-11 NOTE — Assessment & Plan Note (Addendum)
Likely uti, for rocephin IM 1 gm, ua and culture, oral antibx course, antiemetic prn,  to f/u any worsening symptoms or concerns  I spent 31 minutes in preparing to see the patient by review of recent labs, imaging and procedures, obtaining and reviewing separately obtained history, communicating with the patient and family or caregiver, ordering medications, tests or procedures, and documenting clinical information in the EHR including the differential Dx, treatment, and any further evaluation and other management of dysuria, hyperglycemia, htn

## 2020-08-12 ENCOUNTER — Encounter: Payer: Self-pay | Admitting: Internal Medicine

## 2020-08-12 LAB — URINE CULTURE

## 2020-08-15 ENCOUNTER — Ambulatory Visit: Payer: Medicare HMO | Attending: Internal Medicine

## 2020-08-15 DIAGNOSIS — Z23 Encounter for immunization: Secondary | ICD-10-CM

## 2020-08-15 NOTE — Progress Notes (Signed)
   Covid-19 Vaccination Clinic  Name:  FEROL LAICHE    MRN: [id number] DOB: 1946-10-07  08/15/2020  Ms. Mataya was observed post Covid-19 immunization for 15 minutes without incident. She was provided with Vaccine Information Sheet and instruction to access the V-Safe system.   Ms. Erck was instructed to call 911 with any severe reactions post vaccine: Marland Kitchen Difficulty breathing  . Swelling of face and throat  . A fast heartbeat  . A bad rash all over body  . Dizziness and weakness

## 2020-08-27 NOTE — Progress Notes (Signed)
Assessment/Plan:   1.  Falls  -Per patient, she has not had an event in over 10 months. These really do not sound consistent with drop attacks, but we will go ahead and investigate.  -Reviewed MRI of the brain with the patient.  -carotid u/s will be performed.  -CTA brain will be completed to look at vertebrobasilar system. Patient already on aspirin 81 mg daily.  -Did discuss safety. She does state that she may start using a cane in unfamiliar territories and I think that is a good idea.  2.  Cervical dystonia  -not interested in botox  3. We will follow up depending on the results of the above. If unremarkable, follow-up as needed. Subjective:   [name] was seen today in neurologic consultation at the request of Biagio Borg, MD.  The consultation is for the evaluation of "drop attack." Medical records made available to me are reviewed. Pt was worked in for this in Oct but cx the appt.  Primary care records indicate that she has had 2 such events in the month of September.  No details regarding that event in her records.  I have seen the patient previously, but that was all related to tremor, due to cervical dystonia, for which she was not interested in Botox treatment.  Patient reports that her "drop attacks" did not occur in the month of September. States that she has not had an event in over 10 months. With one event she was coming down a step in the garage and she fell.  She doesn't think that she passed out.  She has no idea how she fell.  Doesn't know if she tripped over the step - just found herself at the bottom of the step.  No loss of bladder/bowel function.  Got herself up.  Saw her rheumatologist the day after.  The next event happened happened 10-11 months later.  Was coming out of the kitchen and she fell.  She isn't sure how it happened.  Wasn't sure if "something got a hold of her foot."  She thinks that was 10 months ago from today and has been "fine" ever since.      Does c/o tremor "since I was a baby."   Patient had MRI of the brain completed on July 15, 2020.  I personally reviewed that.  MRI brain without gadolinium was unremarkable.  There were rare T2 hyperintensities.  Last echocardiogram was in May, 2018 was normal left ventricular ejection fraction of 55 to 60%.  No record of carotid ultrasound ever been done.  PREVIOUS MEDICATIONS: klonopin, topamax (for "tremor")  ALLERGIES:   Allergies  Allergen Reactions  . Allegra [Fexofenadine] Other (See Comments)  . Alendronate Sodium     Per pt: unknown  . Atenolol     Increased BP  . Ciprofloxacin Other (See Comments)    Tongue and lip swelling  . Codeine Nausea And Vomiting  . Cortisone     Glucocorticoids specifically: causes swelling   . Doxycycline     Per pt: unknown  . Klonopin [Clonazepam]     Urinary retention  . Lisinopril     05/07/14 lower lip paresthesia  . Mineral Oil     Per pt: unknown  . Pravastatin     Per pt: unknown  . Prednisone     swelling  . Ramipril     Increases BP  . Sulfa Antibiotics     swelling  . Tizanidine Other (See Comments)  Dizziness   . Verapamil     bradycardia    CURRENT MEDICATIONS:  Outpatient Encounter Medications as of 09/01/2020  Medication Sig  . albuterol (VENTOLIN HFA) 108 (90 Base) MCG/ACT inhaler TAKE 2 PUFFS BY MOUTH EVERY 6 HOURS AS NEEDED FOR WHEEZE OR SHORTNESS OF BREATH  . aspirin 81 MG tablet Take 81 mg by mouth daily. Pt only takes occasionally  . cefdinir (OMNICEF) 300 MG capsule Take 1 capsule (300 mg total) by mouth 2 (two) times daily.  . Cholecalciferol (VITAMIN D3) 5000 UNITS CAPS Take 5,000 Units by mouth daily.   . cyanocobalamin 100 MCG tablet Take 100 mcg by mouth daily.  . folic acid (FOLVITE) 1 MG tablet Take 2 mg by mouth daily.   . hydrochlorothiazide (HYDRODIURIL) 25 MG tablet Take 1 tablet (25 mg total) by mouth daily.  Marland Kitchen HYDROcodone-acetaminophen (NORCO/VICODIN) 5-325 MG tablet Take 1-2  tablets by mouth every 4 (four) hours as needed for moderate pain or severe pain.  . iron polysaccharides (POLY-IRON 150) 150 MG capsule TAKE 1 CAPSULE BY MOUTH EVERY DAY  . levothyroxine (SYNTHROID) 50 MCG tablet TAKE 1 TABLET BY MOUTH EVERY DAY  . methotrexate (RHEUMATREX) 2.5 MG tablet Take 10 mg by mouth once a week.   . metoprolol tartrate (LOPRESSOR) 50 MG tablet TAKE 1 TABLET BY MOUTH TWICE A DAY  . Multiple Vitamins-Minerals (CENTRUM SILVER PO) Take by mouth daily.  . ondansetron (ZOFRAN) 4 MG tablet Take 1 tablet (4 mg total) by mouth every 8 (eight) hours as needed for nausea or vomiting.  . pravastatin (PRAVACHOL) 40 MG tablet TAKE 1 TABLET BY MOUTH EVERY DAY  . tiZANidine (ZANAFLEX) 2 MG tablet Take 1 tablet (2 mg total) by mouth every 6 (six) hours as needed for muscle spasms.  Marland Kitchen topiramate (TOPAMAX) 100 MG tablet TAKE 1 TABLET BY MOUTH TWICE A DAY  . traMADol (ULTRAM) 50 MG tablet Take 1 tablet (50 mg total) by mouth every 8 (eight) hours as needed.  . [DISCONTINUED] pantoprazole (PROTONIX) 40 MG tablet Take 1 tablet (40 mg total) by mouth daily. (Patient not taking: Reported on 09/01/2020)  . [DISCONTINUED] pantoprazole (PROTONIX) 40 MG tablet Take 1 tablet (40 mg total) by mouth daily. (Patient not taking: Reported on 09/01/2020)   No facility-administered encounter medications on file as of 09/01/2020.    Objective:   PHYSICAL EXAMINATION:    VITALS:   Vitals:   09/01/20 1114  BP: (!) 157/77  Pulse: 69  SpO2: 99%  Weight: 144 lb (65.3 kg)  Height: 5\' 5"  (1.651 m)    GEN:  Normal appears female in no acute distress.  Appears stated age. HEENT:  Normocephalic, atraumatic. The mucous membranes are moist. The superficial temporal arteries are without ropiness or tenderness. Cardiovascular: Regular rate and rhythm. Lungs: Clear to auscultation bilaterally. Neck/Heme: There are no carotid bruits noted bilaterally.  Neck is rotated to the L with irregular head  tremor  NEUROLOGICAL: Orientation:  The patient is alert and oriented x 3.   Cranial nerves: There is good facial symmetry.  Extraocular muscles are intact and visual fields are full to confrontational testing. Speech is fluent and clear. Soft palate rises symmetrically and there is no tongue deviation. Hearing is intact to conversational tone. Tone: Tone is good throughout. Sensation: Sensation is intact to light touch. Vibration is intact at the bilateral big toe. There is no extinction with double simultaneous stimulation. There is no sensory dermatomal level identified. Coordination:  The patient has no difficulty  with RAM's or FNF bilaterally. Motor: Strength is 5/5 in the bilateral upper and lower extremities.  Shoulder shrug is equal and symmetric. There is no pronator drift.  There are no fasciculations noted. Gait and Station: The patient is able to ambulate without difficulty.     Total time spent on today's visit was 40 minutes, including both face-to-face time and nonface-to-face time.  Time included that spent on review of records (prior notes available to me/labs/imaging if pertinent), discussing treatment and goals, answering patient's questions and coordinating care.   Cc:  Biagio Borg, MD

## 2020-09-01 ENCOUNTER — Ambulatory Visit: Payer: Medicare HMO | Admitting: Neurology

## 2020-09-01 ENCOUNTER — Encounter: Payer: Self-pay | Admitting: Neurology

## 2020-09-01 ENCOUNTER — Other Ambulatory Visit: Payer: Self-pay

## 2020-09-01 VITALS — BP 157/77 | HR 69 | Ht 65.0 in | Wt 144.0 lb

## 2020-09-01 DIAGNOSIS — G243 Spasmodic torticollis: Secondary | ICD-10-CM | POA: Diagnosis not present

## 2020-09-01 DIAGNOSIS — R296 Repeated falls: Secondary | ICD-10-CM

## 2020-09-01 DIAGNOSIS — R55 Syncope and collapse: Secondary | ICD-10-CM | POA: Diagnosis not present

## 2020-09-01 NOTE — Patient Instructions (Addendum)
After  Visit Summary:   Medication Changes: Your physician recommends that you continue on your current medications as directed. Please refer to the Current Medication list given to you today. Please contact your pharmacy if you need medication refills on your medications prescribed by Dr Tat.   Lab work: None ordered    Testing: Your physician has ordered a CT of your Brain.  This test will be performed at [facility]. [facility] is located at [place] or [place].   Once the test has been approved someone from [facility] will contact you to schedule an appointment. If you have not heard from [facility] please give them a call at [phone].   Dr Tat has also ordered you to have a carotid ultrasound at [facility]. Someone from that office will give you a call to get that test set up.   Referral:  none  Follow up:  Your physician recommends that you schedule a follow-up appointment in: To be determined at a later date

## 2020-09-08 ENCOUNTER — Other Ambulatory Visit: Payer: Self-pay

## 2020-09-08 ENCOUNTER — Ambulatory Visit (HOSPITAL_COMMUNITY)
Admission: RE | Admit: 2020-09-08 | Discharge: 2020-09-08 | Disposition: A | Discharge status: Home / Self Care | Payer: Medicare HMO | Source: Ambulatory Visit | Attending: Cardiovascular Disease | Admitting: Cardiovascular Disease

## 2020-09-08 DIAGNOSIS — R55 Syncope and collapse: Secondary | ICD-10-CM | POA: Insufficient documentation

## 2020-09-08 DIAGNOSIS — R296 Repeated falls: Secondary | ICD-10-CM

## 2020-09-15 DIAGNOSIS — H0102B Squamous blepharitis left eye, upper and lower eyelids: Secondary | ICD-10-CM | POA: Diagnosis not present

## 2020-09-15 DIAGNOSIS — H35033 Hypertensive retinopathy, bilateral: Secondary | ICD-10-CM | POA: Diagnosis not present

## 2020-09-15 DIAGNOSIS — H353131 Nonexudative age-related macular degeneration, bilateral, early dry stage: Secondary | ICD-10-CM | POA: Diagnosis not present

## 2020-09-15 DIAGNOSIS — H0102A Squamous blepharitis right eye, upper and lower eyelids: Secondary | ICD-10-CM | POA: Diagnosis not present

## 2020-09-15 DIAGNOSIS — H16223 Keratoconjunctivitis sicca, not specified as Sjogren's, bilateral: Secondary | ICD-10-CM | POA: Diagnosis not present

## 2020-09-15 DIAGNOSIS — M069 Rheumatoid arthritis, unspecified: Secondary | ICD-10-CM | POA: Diagnosis not present

## 2020-09-15 DIAGNOSIS — H40013 Open angle with borderline findings, low risk, bilateral: Secondary | ICD-10-CM | POA: Diagnosis not present

## 2020-09-15 LAB — HM DIABETES EYE EXAM

## 2020-09-16 ENCOUNTER — Ambulatory Visit: Payer: Medicare HMO

## 2020-09-18 ENCOUNTER — Ambulatory Visit
Admission: RE | Admit: 2020-09-18 | Discharge: 2020-09-18 | Disposition: A | Discharge status: Home / Self Care | Payer: Medicare HMO | Source: Ambulatory Visit | Attending: Internal Medicine | Admitting: Internal Medicine

## 2020-09-18 ENCOUNTER — Other Ambulatory Visit: Payer: Self-pay

## 2020-09-18 DIAGNOSIS — Z1231 Encounter for screening mammogram for malignant neoplasm of breast: Secondary | ICD-10-CM | POA: Diagnosis not present

## 2020-09-22 ENCOUNTER — Encounter: Payer: Self-pay | Admitting: Internal Medicine

## 2020-09-23 ENCOUNTER — Ambulatory Visit
Admission: RE | Admit: 2020-09-23 | Discharge: 2020-09-23 | Disposition: A | Discharge status: Home / Self Care | Payer: Medicare HMO | Source: Ambulatory Visit | Attending: Neurology | Admitting: Neurology

## 2020-09-23 ENCOUNTER — Other Ambulatory Visit: Payer: Self-pay

## 2020-09-23 DIAGNOSIS — R55 Syncope and collapse: Secondary | ICD-10-CM | POA: Diagnosis not present

## 2020-09-23 MED ORDER — IOPAMIDOL (ISOVUE-370) INJECTION 76%
75.0000 mL | Freq: Once | INTRAVENOUS | Status: AC | PRN
  Administered 2020-09-23: 75 mL via INTRAVENOUS

## 2020-09-24 ENCOUNTER — Telehealth: Payer: Self-pay | Admitting: Neurology

## 2020-09-24 NOTE — Telephone Encounter (Signed)
No answer at 139 09/24/2020.

## 2020-09-24 NOTE — Telephone Encounter (Signed)
Advised results normal.

## 2020-09-24 NOTE — Telephone Encounter (Signed)
No answer at 4:05 09/24/2020

## 2020-09-24 NOTE — Telephone Encounter (Signed)
Patient called in and left a message wanting to find out if the results on her test were back.

## 2020-10-06 ENCOUNTER — Other Ambulatory Visit: Payer: Self-pay | Admitting: Internal Medicine

## 2020-10-06 NOTE — Telephone Encounter (Signed)
Please refill as per office routine med refill policy (all routine meds refilled for 3 mo or monthly per pt preference up to one year from last visit, then month to month grace period for 3 mo, then further med refills will have to be denied)  

## 2020-10-27 DIAGNOSIS — M2550 Pain in unspecified joint: Secondary | ICD-10-CM | POA: Diagnosis not present

## 2020-10-27 DIAGNOSIS — M0609 Rheumatoid arthritis without rheumatoid factor, multiple sites: Secondary | ICD-10-CM | POA: Diagnosis not present

## 2020-10-27 DIAGNOSIS — I10 Essential (primary) hypertension: Secondary | ICD-10-CM | POA: Diagnosis not present

## 2020-10-27 DIAGNOSIS — R251 Tremor, unspecified: Secondary | ICD-10-CM | POA: Diagnosis not present

## 2020-10-27 DIAGNOSIS — D649 Anemia, unspecified: Secondary | ICD-10-CM | POA: Diagnosis not present

## 2020-10-27 DIAGNOSIS — E7800 Pure hypercholesterolemia, unspecified: Secondary | ICD-10-CM | POA: Diagnosis not present

## 2020-10-27 DIAGNOSIS — Z79899 Other long term (current) drug therapy: Secondary | ICD-10-CM | POA: Diagnosis not present

## 2020-10-27 DIAGNOSIS — R634 Abnormal weight loss: Secondary | ICD-10-CM | POA: Diagnosis not present

## 2020-10-27 DIAGNOSIS — M150 Primary generalized (osteo)arthritis: Secondary | ICD-10-CM | POA: Diagnosis not present

## 2020-12-15 ENCOUNTER — Other Ambulatory Visit: Payer: Self-pay | Admitting: Internal Medicine

## 2020-12-15 NOTE — Telephone Encounter (Signed)
Please refill as per office routine med refill policy (all routine meds refilled for 3 mo or monthly per pt preference up to one year from last visit, then month to month grace period for 3 mo, then further med refills will have to be denied)  

## 2020-12-16 ENCOUNTER — Encounter: Payer: Self-pay | Admitting: Internal Medicine

## 2020-12-16 ENCOUNTER — Other Ambulatory Visit: Payer: Self-pay

## 2020-12-16 ENCOUNTER — Ambulatory Visit (INDEPENDENT_AMBULATORY_CARE_PROVIDER_SITE_OTHER): Payer: Medicare HMO | Admitting: Internal Medicine

## 2020-12-16 VITALS — BP 126/78 | HR 60 | Ht 65.0 in | Wt 140.0 lb

## 2020-12-16 DIAGNOSIS — R251 Tremor, unspecified: Secondary | ICD-10-CM

## 2020-12-16 DIAGNOSIS — E559 Vitamin D deficiency, unspecified: Secondary | ICD-10-CM

## 2020-12-16 DIAGNOSIS — I10 Essential (primary) hypertension: Secondary | ICD-10-CM

## 2020-12-16 DIAGNOSIS — Z0001 Encounter for general adult medical examination with abnormal findings: Secondary | ICD-10-CM

## 2020-12-16 DIAGNOSIS — R7302 Impaired glucose tolerance (oral): Secondary | ICD-10-CM | POA: Diagnosis not present

## 2020-12-16 DIAGNOSIS — E611 Iron deficiency: Secondary | ICD-10-CM

## 2020-12-16 DIAGNOSIS — E039 Hypothyroidism, unspecified: Secondary | ICD-10-CM | POA: Diagnosis not present

## 2020-12-16 DIAGNOSIS — F418 Other specified anxiety disorders: Secondary | ICD-10-CM

## 2020-12-16 DIAGNOSIS — E538 Deficiency of other specified B group vitamins: Secondary | ICD-10-CM

## 2020-12-16 DIAGNOSIS — R269 Unspecified abnormalities of gait and mobility: Secondary | ICD-10-CM

## 2020-12-16 DIAGNOSIS — E785 Hyperlipidemia, unspecified: Secondary | ICD-10-CM | POA: Diagnosis not present

## 2020-12-16 LAB — LIPID PANEL
Cholesterol: 104 mg/dL (ref 0–200)
HDL: 40 mg/dL (ref >39.00)
LDL Cholesterol: 44 mg/dL (ref 0–99)
NonHDL: 64.19
Total CHOL/HDL Ratio: 3
Triglycerides: 101 mg/dL (ref 0.0–149.0)
VLDL: 20.2 mg/dL (ref 0.0–40.0)

## 2020-12-16 LAB — TSH: TSH: 1.39 u[IU]/mL (ref 0.35–4.50)

## 2020-12-16 LAB — BASIC METABOLIC PANEL
BUN: 13 mg/dL (ref 6–23)
CO2: 29 mEq/L (ref 19–32)
Calcium: 9.8 mg/dL (ref 8.4–10.5)
Chloride: 97 mEq/L (ref 96–112)
Creatinine, Ser: 0.84 mg/dL (ref 0.40–1.20)
GFR: 67.89 mL/min (ref >60.00)
Glucose, Bld: 99 mg/dL (ref 70–99)
Potassium: 3.7 mEq/L (ref 3.5–5.1)
Sodium: 132 mEq/L — ABNORMAL LOW (ref 135–145)

## 2020-12-16 LAB — HEPATIC FUNCTION PANEL
ALT: 17 U/L (ref 0–35)
AST: 20 U/L (ref 0–37)
Albumin: 4.2 g/dL (ref 3.5–5.2)
Alkaline Phosphatase: 83 U/L (ref 39–117)
Bilirubin, Direct: 0.1 mg/dL (ref 0.0–0.3)
Total Bilirubin: 0.4 mg/dL (ref 0.2–1.2)
Total Protein: 7.2 g/dL (ref 6.0–8.3)

## 2020-12-16 LAB — URINALYSIS, ROUTINE W REFLEX MICROSCOPIC
Bilirubin Urine: NEGATIVE
Hgb urine dipstick: NEGATIVE
Ketones, ur: NEGATIVE
Nitrite: NEGATIVE
Specific Gravity, Urine: 1.015 (ref 1.000–1.030)
Total Protein, Urine: NEGATIVE
Urine Glucose: NEGATIVE
Urobilinogen, UA: 0.2 (ref 0.0–1.0)
pH: 7 (ref 5.0–8.0)

## 2020-12-16 LAB — CBC WITH DIFFERENTIAL/PLATELET
Basophils Absolute: 0.1 10*3/uL (ref 0.0–0.1)
Basophils Relative: 1 % (ref 0.0–3.0)
Eosinophils Absolute: 0.2 10*3/uL (ref 0.0–0.7)
Eosinophils Relative: 2.5 % (ref 0.0–5.0)
HCT: 37.1 % (ref 36.0–46.0)
Hemoglobin: 12.8 g/dL (ref 12.0–15.0)
Lymphocytes Relative: 18.3 % (ref 12.0–46.0)
Lymphs Abs: 1.4 10*3/uL (ref 0.7–4.0)
MCHC: 34.4 g/dL (ref 30.0–36.0)
MCV: 96 fl (ref 78.0–100.0)
Monocytes Absolute: 0.7 10*3/uL (ref 0.1–1.0)
Monocytes Relative: 8.5 % (ref 3.0–12.0)
Neutro Abs: 5.4 10*3/uL (ref 1.4–7.7)
Neutrophils Relative %: 69.7 % (ref 43.0–77.0)
Platelets: 316 10*3/uL (ref 150.0–400.0)
RBC: 3.86 Mil/uL — ABNORMAL LOW (ref 3.87–5.11)
RDW: 14.4 % (ref 11.5–15.5)
WBC: 7.7 10*3/uL (ref 4.0–10.5)

## 2020-12-16 LAB — HEMOGLOBIN A1C: Hgb A1c MFr Bld: 5.5 % (ref 4.6–6.5)

## 2020-12-16 LAB — T4, FREE: Free T4: 1.11 ng/dL (ref 0.60–1.60)

## 2020-12-16 LAB — IBC PANEL
Iron: 94 ug/dL (ref 42–145)
Saturation Ratios: 24.2 % (ref 20.0–50.0)
Transferrin: 278 mg/dL (ref 212.0–360.0)

## 2020-12-16 LAB — FERRITIN: Ferritin: 85.2 ng/mL (ref 10.0–291.0)

## 2020-12-16 MED ORDER — TOPIRAMATE 100 MG PO TABS
100.0000 mg | ORAL_TABLET | Freq: Two times a day (BID) | ORAL | 3 refills | Status: DC
Start: 2020-12-16 — End: 2021-12-03

## 2020-12-16 MED ORDER — METOPROLOL TARTRATE 50 MG PO TABS: 50.0000 mg | ORAL_TABLET | Freq: Two times a day (BID) | ORAL | 3 refills | Status: DC

## 2020-12-16 MED ORDER — LEVOTHYROXINE SODIUM 50 MCG PO TABS
50.0000 ug | ORAL_TABLET | Freq: Every day | ORAL | 3 refills | Status: DC
Start: 2020-12-16 — End: 2021-12-03

## 2020-12-16 MED ORDER — PANTOPRAZOLE SODIUM 40 MG PO TBEC: 40.0000 mg | DELAYED_RELEASE_TABLET | Freq: Every day | ORAL | 3 refills | Status: DC

## 2020-12-16 MED ORDER — PRAVASTATIN SODIUM 40 MG PO TABS: 40.0000 mg | ORAL_TABLET | Freq: Every day | ORAL | 3 refills | Status: DC

## 2020-12-16 MED ORDER — ALBUTEROL SULFATE HFA 108 (90 BASE) MCG/ACT IN AERS: INHALATION_SPRAY | RESPIRATORY_TRACT | 5 refills | Status: DC

## 2020-12-16 MED ORDER — HYDROCHLOROTHIAZIDE 25 MG PO TABS: 25.0000 mg | ORAL_TABLET | Freq: Every day | ORAL | 3 refills | Status: DC
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
## 2020-12-16 NOTE — Patient Instructions (Addendum)
Please use the cane as suggested by rheumatology as wel  Please continue all other medications as before, and refills have been done if requested including the protonix for the stomach  Please have the pharmacy call with any other refills you may need.  Please continue your efforts at being more active, low cholesterol diet, and weight control.  You are otherwise up to date with prevention measures today.  Please keep your appointments with your specialists as you may have planned  Please go to the LAB at the blood drawing area for the tests to be done  You will be contacted by phone if any changes need to be made immediately.  Otherwise, you will receive a letter about your results with an explanation, but please check with MyChart first.  Please remember to sign up for MyChart if you have not done so, as this will be important to you in the future with finding out test results, communicating by private email, and scheduling acute appointments online when needed.  Please make an Appointment to return in 6 months, or sooner if needed

## 2020-12-17 ENCOUNTER — Encounter: Payer: Self-pay | Admitting: Internal Medicine

## 2020-12-17 ENCOUNTER — Telehealth: Payer: Self-pay

## 2020-12-17 LAB — VITAMIN B12: Vitamin B-12: >1506 pg/mL — ABNORMAL HIGH (ref 211–911)

## 2020-12-17 LAB — VITAMIN D 25 HYDROXY (VIT D DEFICIENCY, FRACTURES): VITD: >120 ng/mL

## 2020-12-17 NOTE — Telephone Encounter (Signed)
CRITICAL VALUE STICKER  CRITICAL VALUE: Vitamin D >120  RECEIVER (on-site recipient of call): Elza Rafter rnc  [place] NOTIFIED: 12/17/20 at [place] (representative from lab): Janalyn Shy   MD NOTIFIED: Dr Jenny Reichmann  TIME OF NOTIFICATION: 1031  RESPONSE: Awaiting response

## 2020-12-21 NOTE — Telephone Encounter (Signed)
Results given to patient

## 2020-12-21 NOTE — Telephone Encounter (Signed)
    Please call patient to discuss lab results 

## 2020-12-23 ENCOUNTER — Encounter: Payer: Self-pay | Admitting: Internal Medicine

## 2020-12-23 DIAGNOSIS — R251 Tremor, unspecified: Secondary | ICD-10-CM | POA: Insufficient documentation

## 2020-12-23 DIAGNOSIS — R269 Unspecified abnormalities of gait and mobility: Secondary | ICD-10-CM | POA: Insufficient documentation

## 2020-12-23 NOTE — Assessment & Plan Note (Signed)
Lab Results  Component Value Date   HGBA1C 5.5 12/16/2020   Stable, pt to continue current medical treatment  - diet and wt control

## 2020-12-23 NOTE — Assessment & Plan Note (Signed)
Currently taking BB, will cont this

## 2020-12-23 NOTE — Assessment & Plan Note (Signed)
Stable per pt, declines need for med change or referral for counseling or psychiatry

## 2020-12-23 NOTE — Assessment & Plan Note (Signed)
Strongly encourage cane use for worsening tremor and gait

## 2020-12-23 NOTE — Assessment & Plan Note (Signed)

## 2020-12-23 NOTE — Assessment & Plan Note (Signed)
Lab Results  Component Value Date   TSH 1.39 12/16/2020   Stable, pt to continue levothyroxine

## 2020-12-23 NOTE — Assessment & Plan Note (Signed)
BP Readings from Last 3 Encounters:  12/16/20 126/78  09/01/20 (!) 157/77  07/28/2020 120/80   Stable, pt to continue medical treatment hct, lopressor

## 2020-12-23 NOTE — Assessment & Plan Note (Signed)
For f/u lab, cont PPI

## 2020-12-23 NOTE — Assessment & Plan Note (Signed)
Lab Results  Component Value Date   LDLCALC 44 12/16/2020   Stable, pt to continue current statin pravachol  40

## 2021-01-19 ENCOUNTER — Ambulatory Visit: Payer: Medicare HMO

## 2021-01-22 DIAGNOSIS — H52223 Regular astigmatism, bilateral: Secondary | ICD-10-CM | POA: Diagnosis not present

## 2021-01-22 DIAGNOSIS — H524 Presbyopia: Secondary | ICD-10-CM | POA: Diagnosis not present

## 2021-01-26 ENCOUNTER — Telehealth: Payer: Self-pay | Admitting: Internal Medicine

## 2021-01-26 DIAGNOSIS — Z79899 Other long term (current) drug therapy: Secondary | ICD-10-CM | POA: Diagnosis not present

## 2021-01-26 DIAGNOSIS — M0609 Rheumatoid arthritis without rheumatoid factor, multiple sites: Secondary | ICD-10-CM | POA: Diagnosis not present

## 2021-01-26 DIAGNOSIS — R634 Abnormal weight loss: Secondary | ICD-10-CM | POA: Diagnosis not present

## 2021-01-26 DIAGNOSIS — R251 Tremor, unspecified: Secondary | ICD-10-CM | POA: Diagnosis not present

## 2021-01-26 DIAGNOSIS — M2550 Pain in unspecified joint: Secondary | ICD-10-CM | POA: Diagnosis not present

## 2021-01-26 DIAGNOSIS — I10 Essential (primary) hypertension: Secondary | ICD-10-CM | POA: Diagnosis not present

## 2021-01-26 DIAGNOSIS — M150 Primary generalized (osteo)arthritis: Secondary | ICD-10-CM | POA: Diagnosis not present

## 2021-01-26 DIAGNOSIS — E7800 Pure hypercholesterolemia, unspecified: Secondary | ICD-10-CM | POA: Diagnosis not present

## 2021-01-26 DIAGNOSIS — D649 Anemia, unspecified: Secondary | ICD-10-CM | POA: Diagnosis not present

## 2021-01-26 NOTE — Telephone Encounter (Signed)
Denton Ar from [facility]. Has called requesting the most recent lab results for the patient   Fax- [phone]

## 2021-01-26 NOTE — Telephone Encounter (Signed)
Labs faxed to [facility] at [phone]

## 2021-02-01 ENCOUNTER — Ambulatory Visit: Payer: Medicare HMO

## 2021-02-04 ENCOUNTER — Ambulatory Visit (INDEPENDENT_AMBULATORY_CARE_PROVIDER_SITE_OTHER): Payer: Medicare HMO

## 2021-02-04 ENCOUNTER — Ambulatory Visit: Payer: Medicare HMO | Admitting: Neurology

## 2021-02-04 ENCOUNTER — Other Ambulatory Visit: Payer: Self-pay

## 2021-02-04 VITALS — BP 122/72 | HR 64 | Temp 97.9°F | Resp 16 | Ht 65.0 in | Wt 145.2 lb

## 2021-02-04 DIAGNOSIS — Z0000 Encounter for general adult medical examination without abnormal findings: Secondary | ICD-10-CM | POA: Diagnosis not present

## 2021-02-04 NOTE — Patient Instructions (Addendum)
[name] , Thank you for taking time to come for your Medicare Wellness Visit. I appreciate your ongoing commitment to your health goals. Please review the following plan we discussed and let me know if I can assist you in the future.   Screening recommendations/referrals: Colonoscopy: 02/07/2018; due every 5 years (2024) Mammogram: 09/18/2020 Bone Density: 12/13/2018; due every 2 years  Recommended yearly ophthalmology/optometry visit for glaucoma screening and checkup Recommended yearly dental visit for hygiene and checkup  Vaccinations: Influenza vaccine: due 05/2021 Pneumococcal vaccine: 10/04/2013, 09/26/2014 Tdap vaccine: 03/28/2014; due every 10 years (2025) Shingrix vaccine: per patient, completed vaccine Covid-19: 12/09/2019, 12/31/2019, 08/15/2020, 01/19/2021  Advanced directives: Documents on file.  Conditions/risks identified: Yes; Reviewed health maintenance screenings with patient today and relevant education, vaccines, and/or referrals were provided. Please continue to do your personal lifestyle choices by: daily care of teeth and gums, regular physical activity (goal should be 5 days a week for 30 minutes), eat a healthy diet, avoid tobacco and drug use, limiting any alcohol intake, taking a low-dose aspirin (if not allergic or have been advised by your provider otherwise) and taking vitamins and minerals as recommended by your provider. Continue doing brain stimulating activities (puzzles, reading, adult coloring books, listening to music, staying active) to keep memory sharp. Continue to eat heart healthy low sodium diet (full of fruits, vegetables, whole grains, lean protein, water--limit salt, fat, and sugar intake) and increase physical activity as tolerated.  Next appointment: Please schedule your next Medicare Wellness Visit with your Nurse Health Advisor in 1 year.   Preventive Care 37 Years and Older, Female Preventive care refers to lifestyle choices and visits with your  health care provider that can promote health and wellness. What does preventive care include?  A yearly physical exam. This is also called an annual well check.  Dental exams once or twice a year.  Routine eye exams. Ask your health care provider how often you should have your eyes checked.  Personal lifestyle choices, including:  Daily care of your teeth and gums.  Regular physical activity.  Eating a healthy diet.  Avoiding tobacco and drug use.  Limiting alcohol use.  Practicing safe sex.  Taking low-dose aspirin every day.  Taking vitamin and mineral supplements as recommended by your health care provider. What happens during an annual well check? The services and screenings done by your health care provider during your annual well check will depend on your age, overall health, lifestyle risk factors, and family history of disease. Counseling  Your health care provider may ask you questions about your:  Alcohol use.  Tobacco use.  Drug use.  Emotional well-being.  Home and relationship well-being.  Sexual activity.  Eating habits.  History of falls.  Memory and ability to understand (cognition).  Work and work Statistician.  Reproductive health. Screening  You may have the following tests or measurements:  Height, weight, and BMI.  Blood pressure.  Lipid and cholesterol levels. These may be checked every 5 years, or more frequently if you are over 41 years old.  Skin check.  Lung cancer screening. You may have this screening every year starting at age 57 if you have a 30-pack-year history of smoking and currently smoke or have quit within the past 15 years.  Fecal occult blood test (FOBT) of the stool. You may have this test every year starting at age 65.  Flexible sigmoidoscopy or colonoscopy. You may have a sigmoidoscopy every 5 years or a colonoscopy every 10 years  starting at age 26.  Hepatitis C blood test.  Hepatitis B blood  test.  Sexually transmitted disease (STD) testing.  Diabetes screening. This is done by checking your blood sugar (glucose) after you have not eaten for a while (fasting). You may have this done every 1-3 years.  Bone density scan. This is done to screen for osteoporosis. You may have this done starting at age 72.  Mammogram. This may be done every 1-2 years. Talk to your health care provider about how often you should have regular mammograms. Talk with your health care provider about your test results, treatment options, and if necessary, the need for more tests. Vaccines  Your health care provider may recommend certain vaccines, such as:  Influenza vaccine. This is recommended every year.  Tetanus, diphtheria, and acellular pertussis (Tdap, Td) vaccine. You may need a Td booster every 10 years.  Zoster vaccine. You may need this after age 47.  Pneumococcal 13-valent conjugate (PCV13) vaccine. One dose is recommended after age 1.  Pneumococcal polysaccharide (PPSV23) vaccine. One dose is recommended after age 66. Talk to your health care provider about which screenings and vaccines you need and how often you need them. This information is not intended to replace advice given to you by your health care provider. Make sure you discuss any questions you have with your health care provider. Document Released: 10/30/2015 Document Revised: 06/22/2016 Document Reviewed: 08/04/2015 Elsevier Interactive Patient Education  2017 [employer] Prevention in the Home Falls can cause injuries. They can happen to people of all ages. There are many things you can do to make your home safe and to help prevent falls. What can I do on the outside of my home?  Regularly fix the edges of walkways and driveways and fix any cracks.  Remove anything that might make you trip as you walk through a door, such as a raised step or threshold.  Trim any bushes or trees on the path to your home.  Use  bright outdoor lighting.  Clear any walking paths of anything that might make someone trip, such as rocks or tools.  Regularly check to see if handrails are loose or broken. Make sure that both sides of any steps have handrails.  Any raised decks and porches should have guardrails on the edges.  Have any leaves, snow, or ice cleared regularly.  Use sand or salt on walking paths during winter.  Clean up any spills in your garage right away. This includes oil or grease spills. What can I do in the bathroom?  Use night lights.  Install grab bars by the toilet and in the tub and shower. Do not use towel bars as grab bars.  Use non-skid mats or decals in the tub or shower.  If you need to sit down in the shower, use a plastic, non-slip stool.  Keep the floor dry. Clean up any water that spills on the floor as soon as it happens.  Remove soap buildup in the tub or shower regularly.  Attach bath mats securely with double-sided non-slip rug tape.  Do not have throw rugs and other things on the floor that can make you trip. What can I do in the bedroom?  Use night lights.  Make sure that you have a light by your bed that is easy to reach.  Do not use any sheets or blankets that are too big for your bed. They should not hang down onto the floor.  Have a  firm chair that has side arms. You can use this for support while you get dressed.  Do not have throw rugs and other things on the floor that can make you trip. What can I do in the kitchen?  Clean up any spills right away.  Avoid walking on wet floors.  Keep items that you use a lot in easy-to-reach places.  If you need to reach something above you, use a strong step stool that has a grab bar.  Keep electrical cords out of the way.  Do not use floor polish or wax that makes floors slippery. If you must use wax, use non-skid floor wax.  Do not have throw rugs and other things on the floor that can make you trip. What can  I do with my stairs?  Do not leave any items on the stairs.  Make sure that there are handrails on both sides of the stairs and use them. Fix handrails that are broken or loose. Make sure that handrails are as long as the stairways.  Check any carpeting to make sure that it is firmly attached to the stairs. Fix any carpet that is loose or worn.  Avoid having throw rugs at the top or bottom of the stairs. If you do have throw rugs, attach them to the floor with carpet tape.  Make sure that you have a light switch at the top of the stairs and the bottom of the stairs. If you do not have them, ask someone to add them for you. What else can I do to help prevent falls?  Wear shoes that:  Do not have high heels.  Have rubber bottoms.  Are comfortable and fit you well.  Are closed at the toe. Do not wear sandals.  If you use a stepladder:  Make sure that it is fully opened. Do not climb a closed stepladder.  Make sure that both sides of the stepladder are locked into place.  Ask someone to hold it for you, if possible.  Clearly mark and make sure that you can see:  Any grab bars or handrails.  First and last steps.  Where the edge of each step is.  Use tools that help you move around (mobility aids) if they are needed. These include:  Canes.  Walkers.  Scooters.  Crutches.  Turn on the lights when you go into a dark area. Replace any light bulbs as soon as they burn out.  Set up your furniture so you have a clear path. Avoid moving your furniture around.  If any of your floors are uneven, fix them.  If there are any pets around you, be aware of where they are.  Review your medicines with your doctor. Some medicines can make you feel dizzy. This can increase your chance of falling. Ask your doctor what other things that you can do to help prevent falls. This information is not intended to replace advice given to you by your health care provider. Make sure you  discuss any questions you have with your health care provider. Document Released: 07/30/2009 Document Revised: 03/10/2016 Document Reviewed: 11/07/2014 Elsevier Interactive Patient Education  2017 [employer].
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
## 2021-03-02 DIAGNOSIS — H353131 Nonexudative age-related macular degeneration, bilateral, early dry stage: Secondary | ICD-10-CM | POA: Diagnosis not present

## 2021-03-02 DIAGNOSIS — M069 Rheumatoid arthritis, unspecified: Secondary | ICD-10-CM | POA: Diagnosis not present

## 2021-03-02 DIAGNOSIS — H0102B Squamous blepharitis left eye, upper and lower eyelids: Secondary | ICD-10-CM | POA: Diagnosis not present

## 2021-03-02 DIAGNOSIS — H35033 Hypertensive retinopathy, bilateral: Secondary | ICD-10-CM | POA: Diagnosis not present

## 2021-03-02 DIAGNOSIS — H16223 Keratoconjunctivitis sicca, not specified as Sjogren's, bilateral: Secondary | ICD-10-CM | POA: Diagnosis not present

## 2021-03-02 DIAGNOSIS — H0102A Squamous blepharitis right eye, upper and lower eyelids: Secondary | ICD-10-CM | POA: Diagnosis not present

## 2021-03-02 DIAGNOSIS — H40013 Open angle with borderline findings, low risk, bilateral: Secondary | ICD-10-CM | POA: Diagnosis not present

## 2021-03-18 ENCOUNTER — Ambulatory Visit: Payer: Medicare HMO | Admitting: Cardiology

## 2021-03-18 ENCOUNTER — Encounter: Payer: Self-pay | Admitting: Cardiology

## 2021-03-18 ENCOUNTER — Other Ambulatory Visit: Payer: Self-pay

## 2021-03-18 ENCOUNTER — Encounter (INDEPENDENT_AMBULATORY_CARE_PROVIDER_SITE_OTHER): Payer: Self-pay

## 2021-03-18 VITALS — BP 146/66 | HR 70 | Ht 65.0 in | Wt 141.2 lb

## 2021-03-18 DIAGNOSIS — R002 Palpitations: Secondary | ICD-10-CM
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
## 2021-04-27 DIAGNOSIS — M0609 Rheumatoid arthritis without rheumatoid factor, multiple sites: Secondary | ICD-10-CM | POA: Diagnosis not present

## 2021-04-27 DIAGNOSIS — M150 Primary generalized (osteo)arthritis: Secondary | ICD-10-CM | POA: Diagnosis not present

## 2021-04-27 DIAGNOSIS — I10 Essential (primary) hypertension: Secondary | ICD-10-CM | POA: Diagnosis not present

## 2021-04-27 DIAGNOSIS — Z79899 Other long term (current) drug therapy: Secondary | ICD-10-CM | POA: Diagnosis not present

## 2021-04-27 DIAGNOSIS — E7800 Pure hypercholesterolemia, unspecified: Secondary | ICD-10-CM | POA: Diagnosis not present

## 2021-04-27 DIAGNOSIS — R251 Tremor, unspecified: Secondary | ICD-10-CM | POA: Diagnosis not present

## 2021-04-27 DIAGNOSIS — M79641 Pain in right hand: Secondary | ICD-10-CM | POA: Diagnosis not present

## 2021-04-27 DIAGNOSIS — M2550 Pain in unspecified joint: Secondary | ICD-10-CM | POA: Diagnosis not present

## 2021-04-27 DIAGNOSIS — D649 Anemia, unspecified: Secondary | ICD-10-CM | POA: Diagnosis not present

## 2021-04-27 DIAGNOSIS — M779 Enthesopathy, unspecified: Secondary | ICD-10-CM | POA: Diagnosis not present

## 2021-04-29 DIAGNOSIS — Z01419 Encounter for gynecological examination (general) (routine) without abnormal findings: Secondary | ICD-10-CM | POA: Diagnosis not present

## 2021-04-29 DIAGNOSIS — N8110 Cystocele, unspecified: Secondary | ICD-10-CM | POA: Diagnosis not present

## 2021-05-06 DIAGNOSIS — N8110 Cystocele, unspecified: Secondary | ICD-10-CM | POA: Diagnosis not present

## 2021-05-18 DIAGNOSIS — R309 Painful micturition, unspecified: Secondary | ICD-10-CM | POA: Diagnosis not present

## 2021-05-31 DIAGNOSIS — Z4689 Encounter for fitting and adjustment of other specified devices: Secondary | ICD-10-CM | POA: Diagnosis not present

## 2021-06-15 DIAGNOSIS — B373 Candidiasis of vulva and vagina: Secondary | ICD-10-CM | POA: Diagnosis not present

## 2021-06-15 DIAGNOSIS — N8110 Cystocele, unspecified: Secondary | ICD-10-CM | POA: Diagnosis not present

## 2021-06-24 ENCOUNTER — Encounter: Payer: Self-pay | Admitting: Internal Medicine

## 2021-06-24 ENCOUNTER — Other Ambulatory Visit: Payer: Self-pay

## 2021-06-24 ENCOUNTER — Ambulatory Visit (INDEPENDENT_AMBULATORY_CARE_PROVIDER_SITE_OTHER): Payer: Medicare HMO | Admitting: Internal Medicine

## 2021-06-24 VITALS — BP 144/64 | HR 66 | Temp 99.1°F | Ht 65.0 in | Wt 141.0 lb

## 2021-06-24 DIAGNOSIS — R7302 Impaired glucose tolerance (oral): Secondary | ICD-10-CM | POA: Diagnosis not present

## 2021-06-24 DIAGNOSIS — E039 Hypothyroidism, unspecified: Secondary | ICD-10-CM

## 2021-06-24 DIAGNOSIS — Z23 Encounter for immunization: Secondary | ICD-10-CM

## 2021-06-24 DIAGNOSIS — E538 Deficiency of other specified B group vitamins: Secondary | ICD-10-CM | POA: Diagnosis not present

## 2021-06-24 DIAGNOSIS — I10 Essential (primary) hypertension: Secondary | ICD-10-CM

## 2021-06-24 DIAGNOSIS — E559 Vitamin D deficiency, unspecified: Secondary | ICD-10-CM | POA: Diagnosis not present
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
## 2021-06-24 NOTE — Patient Instructions (Addendum)
You had the flu shot today  Ok to decrease the Vitamin D3 to 2000 units per day  Ok to decrease the B12 to Mon-Wed-Fri only  Please continue all other medications as before, and refills have been done if requested.  Please have the pharmacy call with any other refills you may need.  Please continue your efforts at being more active, low cholesterol diet, and weight control.  Please keep your appointments with your specialists as you may have planned  Please make an Appointment to return in 6 months, or sooner if needed, also with Lab Appointment for testing done 3-5 days before at the [facility] (so this is for TWO appointments - please see the scheduling desk as you leave)  Due to the ongoing Covid 19 pandemic, our lab now requires an appointment for any labs done at our office.  If you need labs done and do not have an appointment, please call our office ahead of time to schedule before presenting to the lab for your testing.

## 2021-06-27 ENCOUNTER — Encounter: Payer: Self-pay | Admitting: Internal Medicine

## 2021-06-27 DIAGNOSIS — E559 Vitamin D deficiency, unspecified: Secondary | ICD-10-CM | POA: Insufficient documentation

## 2021-06-27 DIAGNOSIS — E538 Deficiency of other specified B group vitamins: Secondary | ICD-10-CM | POA: Insufficient documentation

## 2021-06-27 NOTE — Assessment & Plan Note (Signed)
Lab Results  Component Value Date   VITAMINB12 >1506 (H) 12/16/2020   Now overcontrolled, ok to reduce  oral replacement - b12 1000 mcg to mon-wed-fri

## 2021-06-27 NOTE — Assessment & Plan Note (Signed)
Lab Results  Component Value Date   HGBA1C 5.5 12/16/2020   Stable, pt to continue current medical treatment  - diet

## 2021-06-27 NOTE — Assessment & Plan Note (Signed)
BP Readings from Last 3 Encounters:  06/24/21 (!) 144/64  03/18/21 (!) 146/66  02/04/21 122/72   Uncontrolled here, pt states controlled at home, pt to continue medical treatment zestoretic, lopressor

## 2021-06-27 NOTE — Addendum Note (Signed)
Addended by: Biagio Borg on: 06/27/2021 09:04 PM   Modules accepted: Orders

## 2021-06-27 NOTE — Assessment & Plan Note (Signed)
Last vitamin D Lab Results  Component Value Date   VD25OH >120 12/16/2020   Now overcontrolled on 5000 u qd, for oral replacement reduced to 2000 u qd

## 2021-07-26 DIAGNOSIS — M150 Primary generalized (osteo)arthritis: Secondary | ICD-10-CM | POA: Diagnosis not present

## 2021-07-26 DIAGNOSIS — E7800 Pure hypercholesterolemia, unspecified: Secondary | ICD-10-CM | POA: Diagnosis not present

## 2021-07-26 DIAGNOSIS — Z79899 Other long term (current) drug therapy: Secondary | ICD-10-CM | POA: Diagnosis not present

## 2021-07-26 DIAGNOSIS — M2550 Pain in unspecified joint: Secondary | ICD-10-CM | POA: Diagnosis not present

## 2021-07-26 DIAGNOSIS — M79641 Pain in right hand: Secondary | ICD-10-CM | POA: Diagnosis not present

## 2021-07-26 DIAGNOSIS — R251 Tremor, unspecified: Secondary | ICD-10-CM | POA: Diagnosis not present

## 2021-07-26 DIAGNOSIS — D649 Anemia, unspecified: Secondary | ICD-10-CM | POA: Diagnosis not present

## 2021-07-26 DIAGNOSIS — M0609 Rheumatoid arthritis without rheumatoid factor, multiple sites: Secondary | ICD-10-CM | POA: Diagnosis not present

## 2021-07-26 DIAGNOSIS — I10 Essential (primary) hypertension: Secondary | ICD-10-CM | POA: Diagnosis not present

## 2021-07-26 DIAGNOSIS — M779 Enthesopathy, unspecified: Secondary | ICD-10-CM | POA: Diagnosis not present

## 2021-08-10 ENCOUNTER — Other Ambulatory Visit: Payer: Self-pay | Admitting: Internal Medicine

## 2021-08-10 DIAGNOSIS — Z1231 Encounter for screening mammogram for malignant neoplasm of breast: Secondary | ICD-10-CM
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
## 2021-08-25 ENCOUNTER — Ambulatory Visit (INDEPENDENT_AMBULATORY_CARE_PROVIDER_SITE_OTHER): Payer: Medicare HMO | Admitting: Obstetrics and Gynecology

## 2021-08-25 ENCOUNTER — Other Ambulatory Visit: Payer: Self-pay

## 2021-08-25 ENCOUNTER — Encounter: Payer: Self-pay | Admitting: Obstetrics and Gynecology

## 2021-08-25 VITALS — BP 148/74 | HR 66 | Ht 62.0 in | Wt 140.0 lb

## 2021-08-25 DIAGNOSIS — N3000 Acute cystitis without hematuria: Secondary | ICD-10-CM

## 2021-08-25 DIAGNOSIS — R339 Retention of urine, unspecified: Secondary | ICD-10-CM

## 2021-08-25 DIAGNOSIS — N8110 Cystocele, unspecified: Secondary | ICD-10-CM | POA: Diagnosis not present

## 2021-08-25 DIAGNOSIS — N816 Rectocele: Secondary | ICD-10-CM

## 2021-08-25 DIAGNOSIS — R350 Frequency of micturition: Secondary | ICD-10-CM | POA: Diagnosis not present

## 2021-08-25 LAB — POCT URINALYSIS DIPSTICK
Appearance: ABNORMAL
Bilirubin, UA: NEGATIVE
Blood, UA: NEGATIVE
Glucose, UA: NEGATIVE
Ketones, UA: NEGATIVE
Nitrite, UA: NEGATIVE
Protein, UA: NEGATIVE
Spec Grav, UA: 1.015 (ref 1.010–1.025)
Urobilinogen, UA: 0.2 E.U./dL
pH, UA: 7 (ref 5.0–8.0)

## 2021-08-25 NOTE — Patient Instructions (Signed)

## 2021-08-30 LAB — URINE CULTURE

## 2021-08-31 DIAGNOSIS — H353131 Nonexudative age-related macular degeneration, bilateral, early dry stage: Secondary | ICD-10-CM | POA: Diagnosis not present

## 2021-08-31 DIAGNOSIS — H40013 Open angle with borderline findings, low risk, bilateral: Secondary | ICD-10-CM | POA: Diagnosis not present

## 2021-08-31 DIAGNOSIS — H0102B Squamous blepharitis left eye, upper and lower eyelids: Secondary | ICD-10-CM | POA: Diagnosis not present

## 2021-08-31 DIAGNOSIS — H0102A Squamous blepharitis right eye, upper and lower eyelids: Secondary | ICD-10-CM | POA: Diagnosis not present

## 2021-09-01 MED ORDER — AMPICILLIN 500 MG PO CAPS: 500.0000 mg | ORAL_CAPSULE | Freq: Four times a day (QID) | ORAL | 0 refills | Status: AC

## 2021-09-01 NOTE — Addendum Note (Signed)
Addended by: [name] on: 09/01/2021 10:54 AM   Modules accepted: Orders

## 2021-09-02 NOTE — Progress Notes (Signed)
LM on the VM for the pt to call back re: results

## 2021-09-02 NOTE — Progress Notes (Signed)
Pt was contacted and notified of the results

## 2021-09-13 NOTE — Progress Notes (Signed)
Pt was notified 08/31/2021

## 2021-09-20 ENCOUNTER — Ambulatory Visit
Admission: RE | Admit: 2021-09-20 | Discharge: 2021-09-20 | Disposition: A | Discharge status: Home / Self Care | Payer: Medicare HMO | Source: Ambulatory Visit | Attending: Internal Medicine | Admitting: Internal Medicine

## 2021-09-20 DIAGNOSIS — Z1231 Encounter for screening mammogram for malignant neoplasm of breast: Secondary | ICD-10-CM | POA: Diagnosis not present

## 2021-09-21 ENCOUNTER — Ambulatory Visit (INDEPENDENT_AMBULATORY_CARE_PROVIDER_SITE_OTHER): Payer: Medicare HMO | Admitting: Obstetrics and Gynecology

## 2021-09-21 ENCOUNTER — Other Ambulatory Visit: Payer: Self-pay

## 2021-09-21 VITALS — BP 159/76 | HR 73 | Ht 65.0 in | Wt 140.0 lb

## 2021-09-21 DIAGNOSIS — R350 Frequency of micturition: Secondary | ICD-10-CM

## 2021-09-21 DIAGNOSIS — R339 Retention of urine, unspecified: Secondary | ICD-10-CM | POA: Diagnosis not present

## 2021-09-21 LAB — POCT URINALYSIS DIPSTICK
Appearance: NORMAL
Bilirubin, UA: NEGATIVE
Blood, UA: NEGATIVE
Glucose, UA: NEGATIVE
Ketones, UA: NEGATIVE
Leukocytes, UA: NEGATIVE
Nitrite, UA: NEGATIVE
Protein, UA: NEGATIVE
Spec Grav, UA: 1.015 (ref 1.010–1.025)
Urobilinogen, UA: 0.2 E.U./dL
pH, UA: 7 (ref 5.0–8.0)

## 2021-09-21 NOTE — Patient Instructions (Signed)

## 2021-09-21 NOTE — Progress Notes (Signed)
[facility] Urodynamics Procedure  Referring Physician: Biagio Borg, MD Date of Procedure: 09/21/2021  [name] is a 75 y.o. female who presents for urodynamic evaluation. Indication(s) for study: occult SUI and incomplete bladder emptying  Vital Signs: BP (!) 159/76   Pulse 73   Ht 5\' 5"  (1.651 m)   Wt 140 lb (63.5 kg)   BMI 23.30 kg/m   Laboratory Results: A catheterized urine specimen revealed:  POC urine: negative   Voiding Diary: Not performed  Procedure Timeout:  The correct patient was verified and the correct procedure was verified. The patient was in the correct position and safety precautions were reviewed based on at the patient's history.  Urodynamic Procedure A 13F dual lumen urodynamics catheter was placed under sterile conditions into the patient's bladder. A 13F catheter was placed into the rectum in order to measure abdominal pressure. EMG patches were placed in the appropriate position.  All connections were confirmed and calibrations/adjusted made. Saline was instilled into the bladder through the dual lumen catheters.  Cough/valsalva pressures were measured periodically during filling.  Patient was allowed to void.  The bladder was then emptied of its residual.  UROFLOW: Unable to void. Had a residual of 175 mL.    CMG: This was performed with sterile water in the sitting position at a fill rate of 30 mL/min.    First sensation of fullness was 96 mLs,  First urge was 132 mLs,  Strong urge was 301 mLs and  Capacity was 375 mLs  Stress incontinence was not demonstrated Highest negative Barrier CLPP was 64 cmH20 at 335 ml. Highest negative Barrier VLPP was 36 cmH20 at 335 ml.  Detrusor function was normal, with no phasic contractions seen.    Compliance:  normal. End fill detrusor pressure was -7.5cmH20.    UPP: MUCP with barrier reduction was 58 cm of water.    MICTURITION STUDY: Voiding was performed with reduction using  scopettes in the sitting position. She was unable to void. A sustained detrusor contraction was not present and abdominal straining was present. PVR was 348ml.   EMG: This was performed with patches.  She had voluntary contractions, recruitment with fill was present and urethral sphincter was intermittently relaxed  The details of the procedure with the study tracings have been scanned into EPIC.   Urodynamic Impression:  1. Sensation was normal; capacity was normal 2. Stress Incontinence was not demonstrated. 3. Detrusor Overactivity was not demonstrated. 4. Emptying was dysfunctional with a elevated PVR, a sustained detrusor contraction not present,  abdominal straining present, dyssynergic urethral sphincter activity on EMG.  Plan: - The patient will follow up  to discuss the findings and treatment options.   [name], MD

## 2021-10-15 ENCOUNTER — Other Ambulatory Visit: Payer: Self-pay | Admitting: Internal Medicine

## 2021-10-15 NOTE — Telephone Encounter (Signed)
Please refill as per office routine med refill policy (all routine meds to be refilled for 3 mo or monthly (per pt preference) up to one year from last visit, then month to month grace period for 3 mo, then further med refills will have to be denied) ? ?

## 2021-10-23 IMAGING — MG DIGITAL SCREENING BILAT W/ TOMO W/ CAD
8 series · 9 of 24 positions shown · non-contrast
Comparison: Previous exam(s).

CLINICAL DATA: Screening.

EXAM:
DIGITAL SCREENING BILATERAL MAMMOGRAM WITH TOMO AND CAD

[R MLO synth-2D]
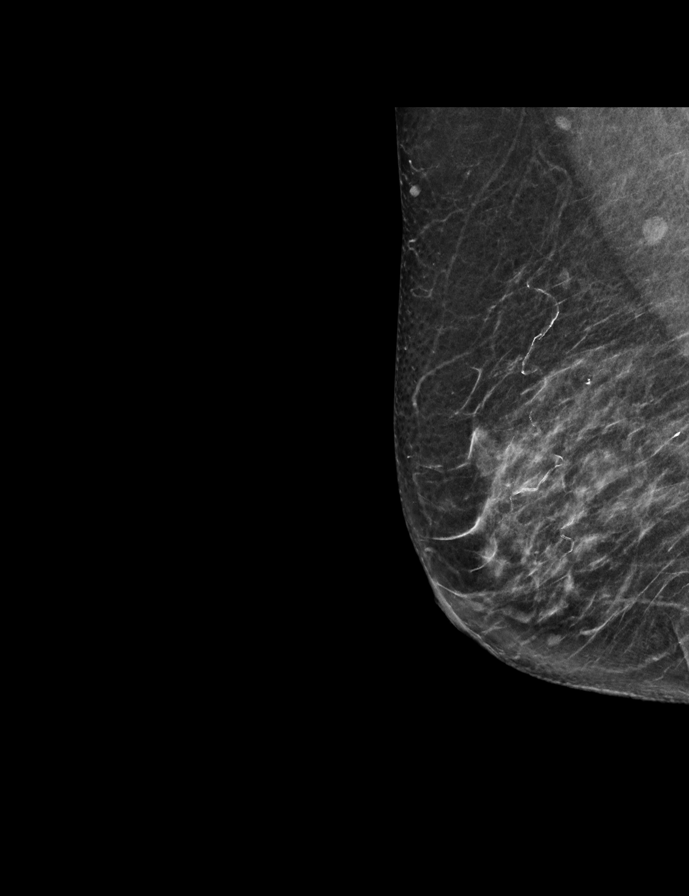

[L MLO synth-2D]
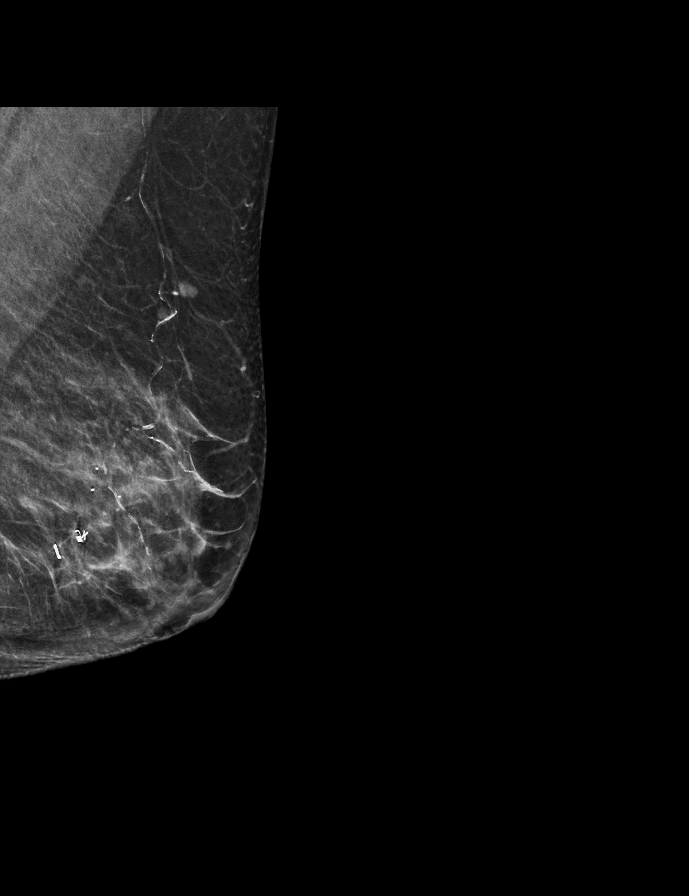

[L CC synth-2D]
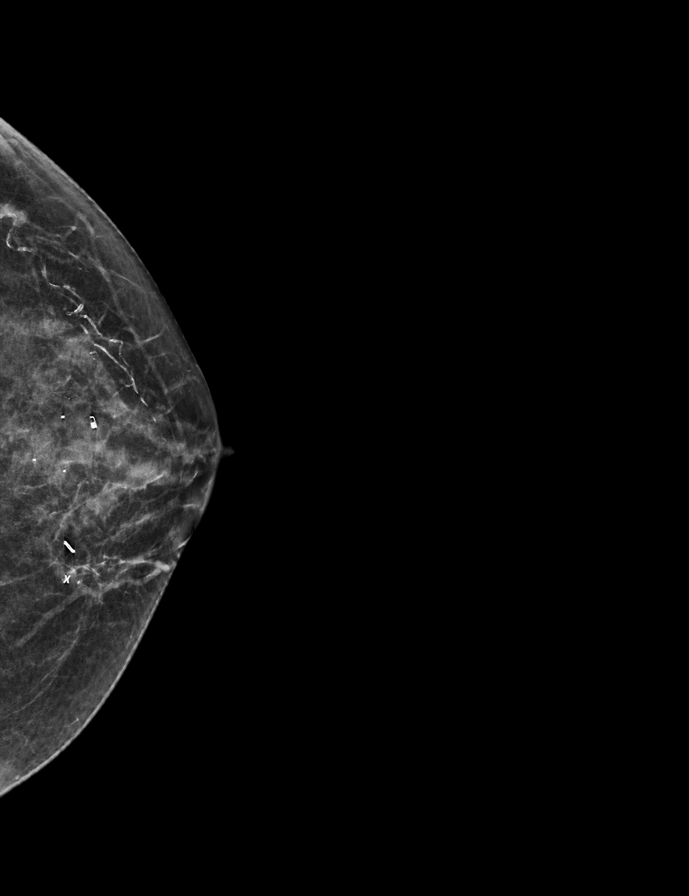

[R CC synth-2D]
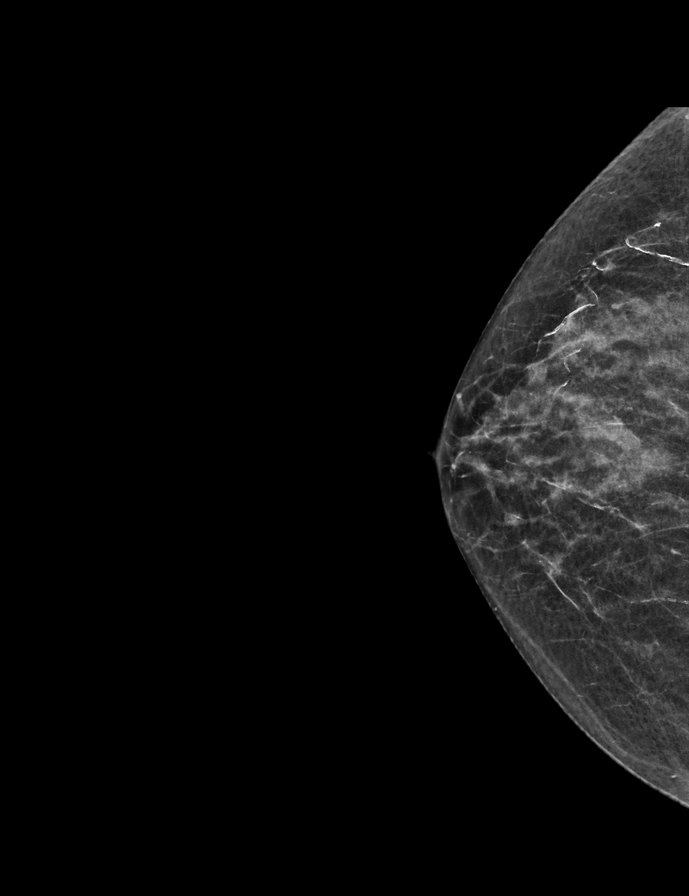

[R MLO tomo · 2 of 56 frames shown]
[frame 19/56]
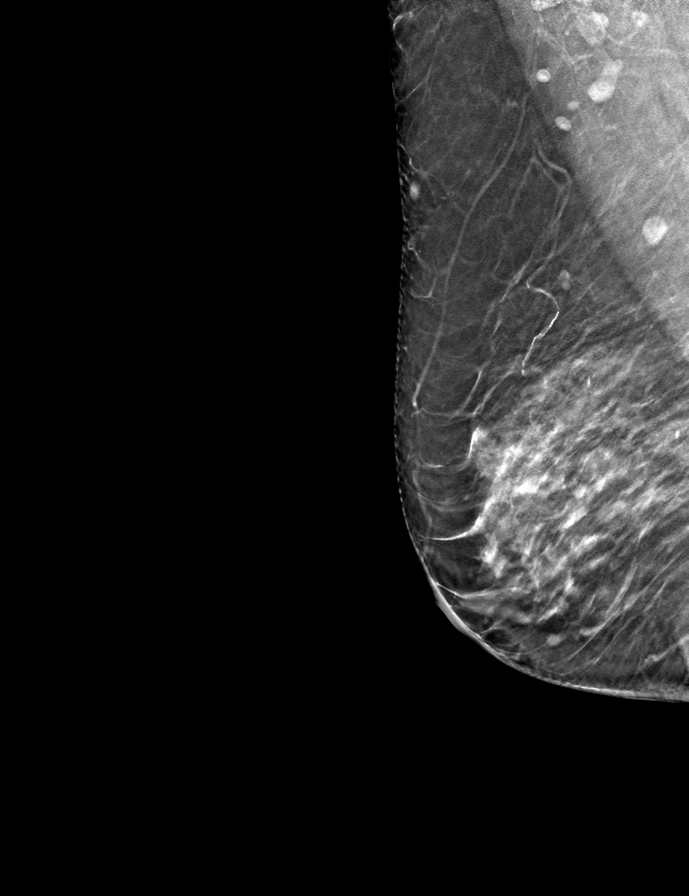
[frame 29/56]
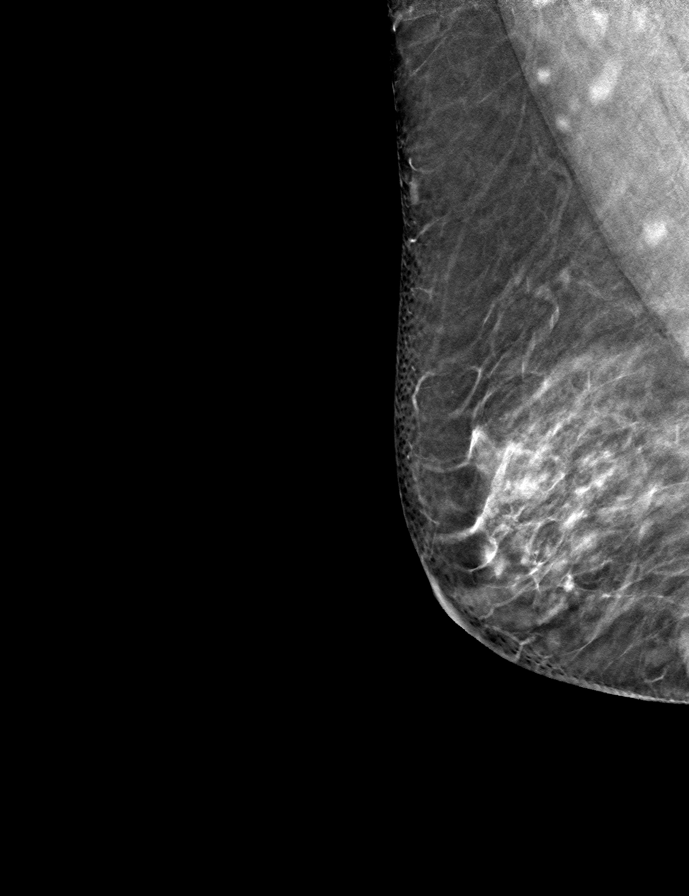

[L CC tomo · tomo slice 23/46.0]
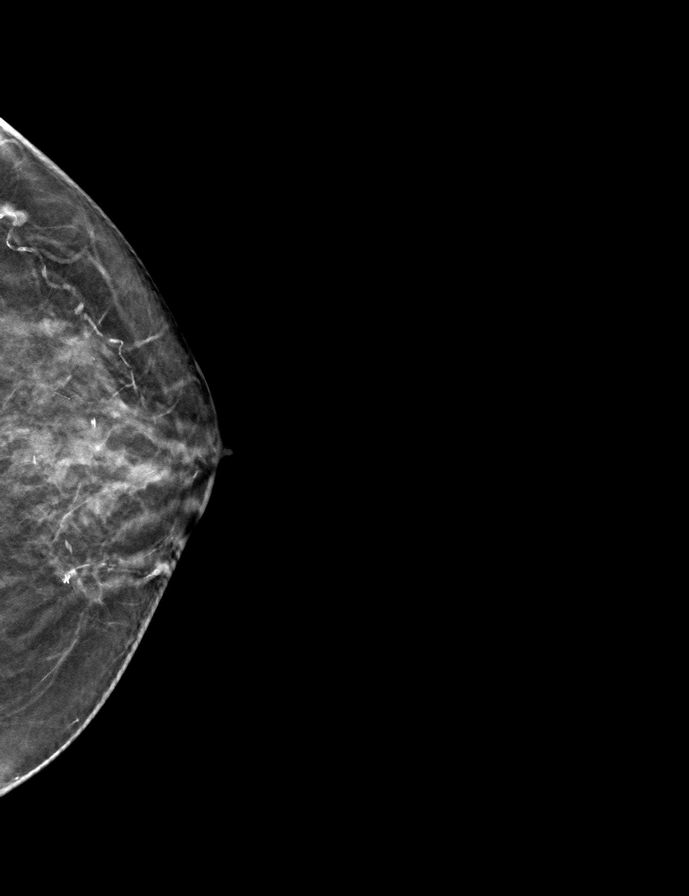

[L MLO tomo · tomo slice 29/57.0]
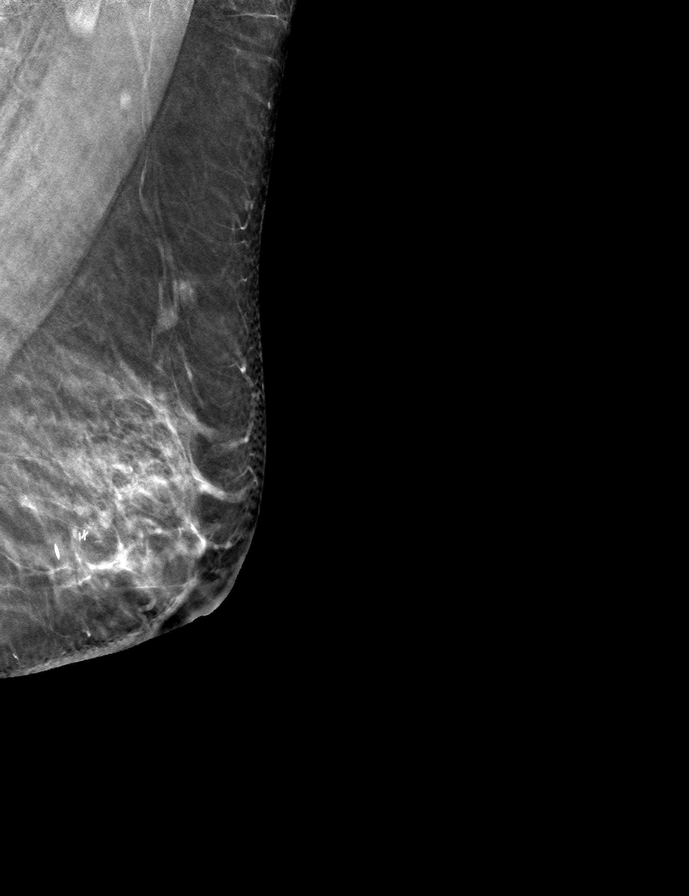

[R CC tomo · tomo slice 24/47.0]
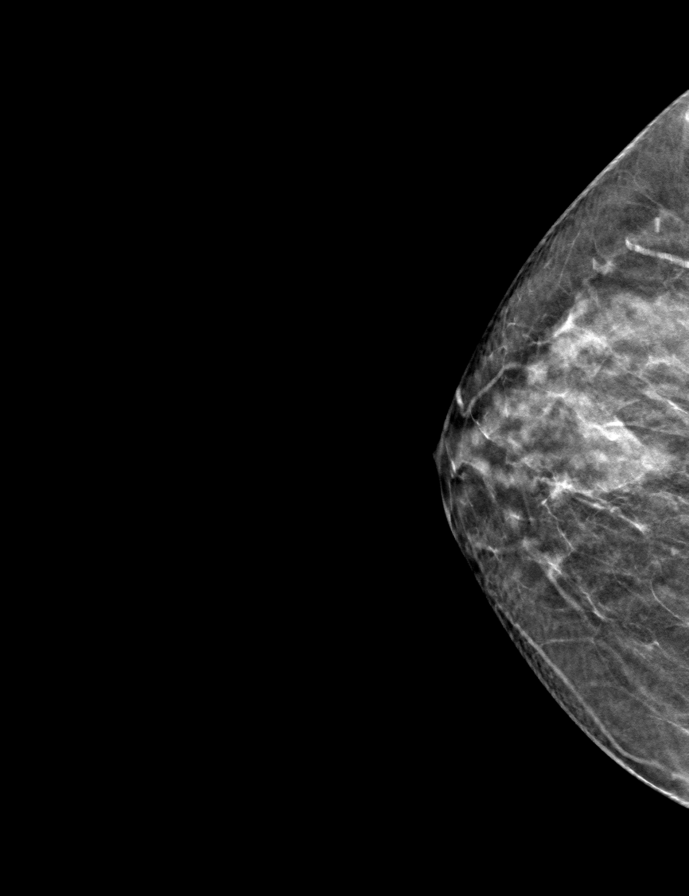

[9 of 24 positions shown; findings below may reference images not displayed]

ACR Breast Density Category b: There are scattered areas of
fibroglandular density.
FINDINGS: There are no findings suspicious for malignancy. Images were
processed with CAD.
IMPRESSION: No mammographic evidence of malignancy. A result letter of this
screening mammogram will be mailed directly to the patient.

RECOMMENDATION:
Screening mammogram in one year. (Code:[id number])

BI-RADS CATEGORY  1: Negative.

## 2021-10-24 NOTE — Progress Notes (Signed)
[facility] Return Visit  SUBJECTIVE  History of Present Illness: [name] is a 76 y.o. female seen in follow-up after urodynamic testing.   Urodynamic Impression:  1. Sensation was normal; capacity was normal 2. Stress Incontinence was not demonstrated. 3. Detrusor Overactivity was not demonstrated. 4. Emptying was dysfunctional with a elevated PVR (337ml), a sustained detrusor contraction not present,  abdominal straining present, dyssynergic urethral sphincter activity on EMG.  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   Past Surgical History: She  has a past surgical history that includes Abdominal hysterectomy; Tonsillectomy; Tubal ligation; Cataract extraction, bilateral; Breast lumpectomy with radioactive seed localization (Left, 04/05/2016); Colonoscopy; Polypectomy; Varicose vein surgery; and Breast excisional biopsy (Left, 2017).   Medications: She has a current medication list which includes the following prescription(s): aspirin, cyanocobalamin, folic acid, levothyroxine, lisinopril-hydrochlorothiazide, methotrexate, metoprolol tartrate, multiple vitamins-minerals, pantoprazole, pravastatin, topiramate, and albuterol.   Allergies: Patient is allergic to allegra [fexofenadine], alendronate sodium, atenolol,  ciprofloxacin, codeine, cortisone, doxycycline, klonopin [clonazepam], lisinopril, mineral oil, pravastatin, prednisone, ramipril, sulfa antibiotics, tizanidine, and verapamil.   Social History: Patient  reports that she has never smoked. She has never used smokeless tobacco. She reports that she does not drink alcohol and does not use drugs.      OBJECTIVE     Physical Exam: Vitals:   10/25/21 0917  BP: (!) 147/73  Pulse: 70  Weight: 140 lb (63.5 kg)   Gen: No apparent distress, A&O x 3.  Detailed Urogynecologic Evaluation:  Deferred. Prior exam showed:  POP-Q:    POP-Q   -1                                            Aa   -1                                           Ba   -7                                              C    2                                            Gh   4                                            Pb   9  tvl    -1.5                                            Ap   -1.5                                            Bp                                                  D      ASSESSMENT AND PLAN    [name] is a 76 y.o. with:  1. Prolapse of anterior vaginal wall   2. Prolapse of posterior vaginal wall     Plan for surgery: Exam under anesthesia, anterior and posterior repair, cystoscopy  - We reviewed the patient's specific anatomic and functional findings, with the assistance of diagrams, and together finalized the above procedure. The planned surgical procedures were discussed along with the surgical risks outlined below, which were also provided on a detailed handout. Additional treatment options including expectant management, conservative management, medical management were discussed where appropriate.  We reviewed the benefits and risks of each treatment option.   General Surgical Risks: For all procedures, there are risks of bleeding, infection, damage to surrounding organs including but not  limited to bowel, bladder, blood vessels, ureters and nerves, and need for further surgery if an injury were to occur. These risks are all low with minimally invasive surgery.   There are risks of numbness and weakness at any body site or buttock/rectal pain.  It is possible that baseline pain can be worsened by surgery, either with or without mesh. If surgery is vaginal, there is also a low risk of possible conversion to laparoscopy or open abdominal incision where indicated. Very rare risks include blood transfusion, blood clot, heart attack, pneumonia, or death.   There is also a risk of short-term postoperative urinary retention with need to use a catheter. About half of patients need to go home from surgery with a catheter, which is then later removed in the office. The risk of long-term need for a catheter is very low. There is also a risk of worsening of overactive bladder. She is at increased risk of needing a catheter since she has intermittent urinary retention.   Prolapse (with or without mesh): Risk factors for surgical failure  include things that put pressure on your pelvis and the surgical repair, including obesity, chronic cough, and heavy lifting or straining (including lifting children or adults, straining on the toilet, or lifting heavy objects such as furniture or anything weighing >25 lbs. Risks of recurrence is 20-30% with vaginal native tissue repair and a less than 10% with sacrocolpopexy with mesh.    - For preop Visit:  She is required to have a visit within 30 days of her surgery.   Today we reviewed pre-operative preparation, peri-operative expectations, and post-operative instructions/recovery.  She was provided with instructional handouts. She understands not to take aspirin (>81mg ) or NSAIDs 7 days prior to surgery. Prescriptions will provided for: Oxycodone 5mg , Ibuprofen 600mg , Tylenol 500mg , Miralax. These prescriptions will be sent prior  to surgery.  - Medical  clearance: Cardiac clearance required Letter sent to Dr Curt Bears ([place]) requesting risk stratification and medical optimization due to history of A. Fib.  - Anticoagulant use: only baby aspirin- can continue up until surgery - Medicaid Hysterectomy form: No - Accepts blood transfusion: Yes - Expected length of stay: outpatient  Request sent for surgery scheduling.   [name], MD  Time spent: I spent 35 minutes dedicated to the care of this patient on the date of this encounter to include pre-visit review of records, face-to-face time with the patient and post visit documentation

## 2021-10-25 ENCOUNTER — Ambulatory Visit (INDEPENDENT_AMBULATORY_CARE_PROVIDER_SITE_OTHER): Payer: Medicare HMO | Admitting: Obstetrics and Gynecology

## 2021-10-25 ENCOUNTER — Encounter: Payer: Self-pay | Admitting: Obstetrics and Gynecology

## 2021-10-25 ENCOUNTER — Other Ambulatory Visit: Payer: Self-pay

## 2021-10-25 VITALS — BP 147/73 | HR 70 | Wt 140.0 lb

## 2021-10-25 DIAGNOSIS — R339 Retention of urine, unspecified: Secondary | ICD-10-CM | POA: Diagnosis not present

## 2021-10-25 DIAGNOSIS — N816 Rectocele: Secondary | ICD-10-CM

## 2021-10-25 DIAGNOSIS — N8110 Cystocele, unspecified: Secondary | ICD-10-CM

## 2021-10-25 NOTE — Patient Instructions (Signed)
POST OPERATIVE INSTRUCTIONS  General Instructions Recovery (not bed rest) will last approximately 6 weeks Walking is encouraged, but refrain from strenuous exercise/ housework/ heavy lifting. No lifting >10lbs  Nothing in the vagina- NO intercourse, tampons or douching Bathing:  Do not submerge in water (NO swimming, bath, hot tub, etc) until after your postop visit. You can shower starting the day after surgery.  No driving until you are not taking narcotic pain medicine and until your pain is well enough controlled that you can slam on the breaks or make sudden movements if needed.   Taking your medications Please take your acetaminophen and ibuprofen on a schedule for the first 48 hours. Take 600mg  ibuprofen, then take 500mg  acetaminophen 3 hours later, then continue to alternate ibuprofen and acetaminophen. That way you are taking each type of medication every 6 hours. Take the prescribed narcotic (oxycodone, tramadol, etc) as needed, with a maximum being every 4 hours.  Take a stool softener daily to keep your stools soft and preventing you from straining. If you have diarrhea, you decrease your stool softener. This is explained more below. We have prescribed you Miralax.  Reasons to Call the Nurse (see last page for phone numbers) Heavy Bleeding (changing your pad every 1-2 hours) Persistent nausea/vomiting Fever (100.4 degrees or more) Incision problems (pus or other fluid coming out, redness, warmth, increased pain)  Things to Expect After Surgery Mild to Moderate pain is normal during the first day or two after surgery. If prescribed, take Ibuprofen or Tylenol first and use the stronger medicine for break-through pain. You can overlap these medicines because they work differently.   Constipation   To Prevent Constipation:  Eat a well-balanced diet including protein, grains, fresh fruit and vegetables.  Drink plenty of fluids. Walk regularly.  Depending on specific instructions  from your physician: take Miralax daily and additionally you can add a stool softener (colace/ docusate) and fiber supplement. Continue as long as you're on pain medications.   To Treat Constipation:  If you do not have a bowel movement in 2 days after surgery, you can take 2 Tbs of Milk of Magnesia 1-2 times a day until you have a bowel movement. If diarrhea occurs, decrease the amount or stop the laxative. If no results with Milk of Magnesia, you can drink a bottle of magnesium citrate which you can purchase over the counter.  Fatigue:  This is a normal response to surgery and will improve with time.  Plan frequent rest periods throughout the day.  Gas Pain:  This is very common but can also be very painful! Drink warm liquids such as herbal teas, bouillon or soup. Walking will help you pass more gas.  Mylicon or Gas-X can be taken over the counter.  Leaking Urine:  Varying amounts of leakage may occur after surgery.  This should improve with time. Your bladder needs at least 3 months to recover from surgery. If you leak after surgery, be sure to mention this to your doctor at your post-op visit. If you were taking medications for overactive bladder prior to surgery, be sure to restart the medications immediately after surgery.  Incisions: If you have incisions on your abdomen, the skin glue will dissolve on its own over time. It is ok to gently rinse with soap and water over these incisions but do not scrub.  Catheter Approximately 30- 50% of patients are unable to urinate after surgery and need to go home with a catheter. This allows your bladder  to rest so it can return to full function. If you go home with a catheter, the office will call to set up a voiding trial a few days after surgery. For most patients, by this visit, they are able to urinate on their own. Long term catheter use is rare.    Post op concerns  For non-emergent issues, please call the Urogynecology Nurse. Please leave a  message and someone will contact you within one business day.  You can also send a message through [place].   AFTER HOURS (After 5:00 PM and on weekends):  For urgent matters that cannot wait until the next business day. Call our office [phone] and connect to the doctor on call.  Please reserve this for important issues.   **FOR ANY TRUE EMERGENCY ISSUES CALL 911 OR GO TO THE NEAREST EMERGENCY ROOM.** Please inform our office or the doctor on call of any emergency.     APPOINTMENTS: Call [phone]

## 2021-10-27 ENCOUNTER — Telehealth: Payer: Self-pay | Admitting: *Deleted

## 2021-10-27 ENCOUNTER — Encounter: Payer: Self-pay | Admitting: Obstetrics and Gynecology

## 2021-10-27 NOTE — Telephone Encounter (Signed)
° °  Pre-operative Risk Assessment    Patient Name: [name]  DOB: [birth year] MRN: [id number]      Request for Surgical Clearance    Procedure:   PELVIC ORGAN PROLAPSE; ANTERIOR & POSTERIOR REPAIR CYSTOSCOPY  Date of Surgery:  Clearance TBD                                 Surgeon:  DR. Sherlene Shams Surgeon's Group or Practice Name:  [facility] Phone number:  [phone] Fax number:  [phone]   Type of Clearance Requested:   - Medical  - Pharmacy:  Hold Aspirin     Type of Anesthesia:  General    Additional requests/questions:    Jiles Prows   10/27/2021, 12:15 PM

## 2021-10-27 NOTE — Telephone Encounter (Signed)
° °  Name: [name]  DOB: 1946/06/03  MRN: [id number]   Primary Cardiologist: Dr. Curt Bears  Chart reviewed as part of pre-operative protocol coverage. Patient was contacted 10/27/2021 in reference to pre-operative risk assessment for pending surgery as outlined below.  [name] was last seen on 03/18/2021 by Dr. Curt Bears.  Since that day, [name] has done well without exertional chest pain or worsening dyspnea.  She has never been diagnosed with CAD.  She is able to accomplish more than 4 METS of activity without any issue.  Therefore, based on ACC/AHA guidelines, the patient would be at acceptable risk for the planned procedure without further cardiovascular testing.   From the cardiac perspective, she may hold aspirin for 5 to 7 days prior to this surgery and restart as well as possible afterward at the surgeon's discretion.  However per patient, her surgeon told her that she may continue on aspirin through the surgery.  The patient was advised that if she develops new symptoms prior to surgery to contact our office to arrange for a follow-up visit, and she verbalized understanding.  I will route this recommendation to the requesting party via Epic fax function and remove from pre-op pool. Please call with questions.  [place], [place] 10/27/2021, 2:40 PM

## 2021-11-16 ENCOUNTER — Encounter (HOSPITAL_BASED_OUTPATIENT_CLINIC_OR_DEPARTMENT_OTHER): Payer: Self-pay | Admitting: Obstetrics and Gynecology

## 2021-11-18 ENCOUNTER — Encounter (HOSPITAL_BASED_OUTPATIENT_CLINIC_OR_DEPARTMENT_OTHER): Payer: Self-pay | Admitting: Obstetrics and Gynecology

## 2021-11-18 ENCOUNTER — Other Ambulatory Visit: Payer: Self-pay | Admitting: Obstetrics and Gynecology

## 2021-11-18 ENCOUNTER — Other Ambulatory Visit: Payer: Self-pay

## 2021-11-18 DIAGNOSIS — Z01818 Encounter for other preprocedural examination: Secondary | ICD-10-CM

## 2021-11-18 MED ORDER — OXYCODONE HCL 5 MG PO TABS: 5.0000 mg | ORAL_TABLET | ORAL | 0 refills | Status: DC | PRN

## 2021-11-18 MED ORDER — ACETAMINOPHEN 500 MG PO TABS: 500.0000 mg | ORAL_TABLET | Freq: Four times a day (QID) | ORAL | 0 refills | Status: DC | PRN

## 2021-11-18 MED ORDER — POLYETHYLENE GLYCOL 3350 17 GM/SCOOP PO POWD: 17.0000 g | Freq: Every day | ORAL | 0 refills | Status: DC

## 2021-11-18 MED ORDER — IBUPROFEN 600 MG PO TABS: 600.0000 mg | ORAL_TABLET | Freq: Four times a day (QID) | ORAL | 0 refills | Status: DC | PRN
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
## 2021-11-18 NOTE — Progress Notes (Addendum)
Spoke w/ via phone for pre-op interview--- pt Lab needs dos----  [facility] (per anes)             Lab results------  current ekg in epic/ chart COVID test -----patient states asymptomatic no test needed Arrive at ------- 0745 on 11-22-2021 NPO after MN NO Solid Food.  Clear liquids from MN until--- 0645 Med rec completed Medications to take morning of surgery ----- lopressor, topamax, synthroid Diabetic medication ----- n/a Patient instructed no nail polish to be worn day of surgery Patient instructed to bring photo id and insurance card day of surgery Patient aware to have Driver (ride ) ---sister, sandy daultry/ caregiver  for 24 hours after surgery ---  pt verbalized understanding she is to have a caregiver due to anesthesia and have name & phone number when check in [facility] Patient Special Instructions ----- n/a Pre-Op special Istructions ----- sent inbox message to dr schroeder in epic , requested orders Pt has telephone cardiac clearance by Almyra Deforest PA on 10-27-2021 in chart/ epic Patient verbalized understanding of instructions that were given at this phone interview. Patient denies shortness of breath, chest pain, fever, cough at this phone interview.    Anesthesia Review:  HTN;  Tachy-brady syndrome;  RA;  hx DVT LLL 1990s and LUE 2012 per pt no blood clots since ;   clotting disorder (heterozygous prothrombin mutation) takes asa 81mg  daily. Per pt was told by Dr Wannetta Sender to continue asa prior to surgery  PCP: Dr Marshall Cork Cardiologist : Dr Curt Bears Cassell Clement 03-18-2021 epic) Rheumatologist:  Dr Dossie Der Chest x-ray : CT 09-23-2020 epic EKG : 03-18-2021 epic Echo : 02-16-2017 epic Stress test: 08-03-2012 epic Cardiac Cath :  no Activity level: denies sob w/ any activity Sleep Study/ CPAP : no  Blood Thinner/ Instructions Maryjane Hurter Dose:  no ASA / Instructions/ Last Dose :  ASA 81mg 

## 2021-11-19 NOTE — Progress Notes (Signed)
[name] is a 76 y.o. female was contacted and notified

## 2021-11-22 ENCOUNTER — Telehealth: Payer: Self-pay | Admitting: Obstetrics and Gynecology

## 2021-11-22 ENCOUNTER — Other Ambulatory Visit: Payer: Self-pay

## 2021-11-22 ENCOUNTER — Encounter (HOSPITAL_BASED_OUTPATIENT_CLINIC_OR_DEPARTMENT_OTHER): Payer: Self-pay | Admitting: Obstetrics and Gynecology

## 2021-11-22 ENCOUNTER — Encounter (HOSPITAL_BASED_OUTPATIENT_CLINIC_OR_DEPARTMENT_OTHER)
Admission: RE | Disposition: A | Discharge status: Home / Self Care | Payer: Self-pay | Source: Home / Self Care | Attending: Obstetrics and Gynecology

## 2021-11-22 ENCOUNTER — Ambulatory Visit (HOSPITAL_BASED_OUTPATIENT_CLINIC_OR_DEPARTMENT_OTHER): Payer: Medicare HMO | Admitting: Anesthesiology

## 2021-11-22 ENCOUNTER — Ambulatory Visit (HOSPITAL_BASED_OUTPATIENT_CLINIC_OR_DEPARTMENT_OTHER)
Admission: RE | Admit: 2021-11-22 | Discharge: 2021-11-22 | Disposition: A | Discharge status: Home / Self Care | Payer: Medicare HMO | Attending: Obstetrics and Gynecology | Admitting: Obstetrics and Gynecology

## 2021-11-22 DIAGNOSIS — R69 Illness, unspecified: Secondary | ICD-10-CM | POA: Diagnosis not present

## 2021-11-22 DIAGNOSIS — N8111 Cystocele, midline: Secondary | ICD-10-CM | POA: Diagnosis not present

## 2021-11-22 DIAGNOSIS — N816 Rectocele: Secondary | ICD-10-CM | POA: Insufficient documentation

## 2021-11-22 DIAGNOSIS — K219 Gastro-esophageal reflux disease without esophagitis: Secondary | ICD-10-CM | POA: Diagnosis not present

## 2021-11-22 DIAGNOSIS — F419 Anxiety disorder, unspecified: Secondary | ICD-10-CM | POA: Insufficient documentation

## 2021-11-22 DIAGNOSIS — E039 Hypothyroidism, unspecified: Secondary | ICD-10-CM | POA: Insufficient documentation

## 2021-11-22 DIAGNOSIS — Z86718 Personal history of other venous thrombosis and embolism: Secondary | ICD-10-CM | POA: Diagnosis not present

## 2021-11-22 DIAGNOSIS — F32A Depression, unspecified: Secondary | ICD-10-CM | POA: Insufficient documentation

## 2021-11-22 DIAGNOSIS — I4891 Unspecified atrial fibrillation: Secondary | ICD-10-CM | POA: Diagnosis not present

## 2021-11-22 DIAGNOSIS — I10 Essential (primary) hypertension: Secondary | ICD-10-CM | POA: Insufficient documentation

## 2021-11-22 HISTORY — DX: Personal history of other diseases of the circulatory system: Z86.79

## 2021-11-22 HISTORY — DX: Headache, unspecified: R51.9

## 2021-11-22 HISTORY — DX: Other seasonal allergic rhinitis: J30.2

## 2021-11-22 HISTORY — DX: Presence of spectacles and contact lenses: Z97.3

## 2021-11-22 HISTORY — DX: Unspecified macular degeneration: H35.30

## 2021-11-22 HISTORY — PX: CYSTOSCOPY: SHX5120

## 2021-11-22 HISTORY — PX: ANTERIOR AND POSTERIOR REPAIR WITH SACROSPINOUS FIXATION: SHX6536

## 2021-11-22 HISTORY — DX: Personal history of other venous thrombosis and embolism: Z86.718

## 2021-11-22 HISTORY — DX: Cystocele, unspecified: N81.10

## 2021-11-22 HISTORY — DX: Personal history of adenomatous and serrated colon polyps: Z86.0101

## 2021-11-22 HISTORY — DX: Other chronic pain: G89.29

## 2021-11-22 HISTORY — DX: Personal history of colonic polyps: Z86.010

## 2021-11-22 HISTORY — DX: Unspecified osteoarthritis, unspecified site: M19.90

## 2021-11-22 LAB — POCT I-STAT, CHEM 8
BUN: 16 mg/dL (ref 8–23)
Calcium, Ion: 1.27 mmol/L (ref 1.15–1.40)
Chloride: 95 mmol/L — ABNORMAL LOW (ref 98–111)
Creatinine, Ser: 0.7 mg/dL (ref 0.44–1.00)
Glucose, Bld: 115 mg/dL — ABNORMAL HIGH (ref 70–99)
HCT: 41 % (ref 36.0–46.0)
Hemoglobin: 13.9 g/dL (ref 12.0–15.0)
Potassium: 3.1 mmol/L — ABNORMAL LOW (ref 3.5–5.1)
Sodium: 133 mmol/L — ABNORMAL LOW (ref 135–145)
TCO2: 24 mmol/L (ref 22–32)

## 2021-11-22 SURGERY — ANTERIOR AND POSTERIOR REPAIR WITH SACROSPINOUS FIXATION
Anesthesia: General | Site: Vagina

## 2021-11-22 MED ORDER — ONDANSETRON HCL 4 MG/2ML IJ SOLN: 4.0000 mg | Freq: Once | INTRAMUSCULAR | Status: DC | PRN

## 2021-11-22 MED ORDER — FENTANYL CITRATE (PF) 100 MCG/2ML IJ SOLN
INTRAMUSCULAR | Status: AC
  Filled 2021-11-22: qty 2

## 2021-11-22 MED ORDER — FENTANYL CITRATE (PF) 100 MCG/2ML IJ SOLN: 25.0000 ug | INTRAMUSCULAR | Status: DC | PRN

## 2021-11-22 MED ORDER — ONDANSETRON HCL 4 MG/2ML IJ SOLN
INTRAMUSCULAR | Status: DC | PRN
  Administered 2021-11-22: 4 mg via INTRAVENOUS

## 2021-11-22 MED ORDER — LIDOCAINE HCL (CARDIAC) PF 100 MG/5ML IV SOSY
PREFILLED_SYRINGE | INTRAVENOUS | Status: DC | PRN
Start: 2021-11-22 — End: 2021-11-22
  Administered 2021-11-22: 60 mg via INTRAVENOUS

## 2021-11-22 MED ORDER — PROPOFOL 10 MG/ML IV BOLUS
INTRAVENOUS | Status: DC | PRN
Start: 2021-11-22 — End: 2021-11-22
  Administered 2021-11-22: 20 mg via INTRAVENOUS
  Administered 2021-11-22: 100 mg via INTRAVENOUS

## 2021-11-22 MED ORDER — SODIUM CHLORIDE 0.9 % IR SOLN
Status: DC | PRN
  Administered 2021-11-22: 300 mL via INTRAVESICAL

## 2021-11-22 MED ORDER — LIDOCAINE HCL (PF) 2 % IJ SOLN
INTRAMUSCULAR | Status: AC
  Filled 2021-11-22: qty 5

## 2021-11-22 MED ORDER — ACETAMINOPHEN 500 MG PO TABS
1000.0000 mg | ORAL_TABLET | Freq: Once | ORAL | Status: AC
  Administered 2021-11-22: 1000 mg via ORAL

## 2021-11-22 MED ORDER — 0.9 % SODIUM CHLORIDE (POUR BTL) OPTIME
TOPICAL | Status: DC | PRN
  Administered 2021-11-22: 500 mL

## 2021-11-22 MED ORDER — ACETAMINOPHEN 500 MG PO TABS
ORAL_TABLET | ORAL | Status: AC
  Filled 2021-11-22: qty 2

## 2021-11-22 MED ORDER — LACTATED RINGERS IV SOLN: INTRAVENOUS | Status: DC | PRN

## 2021-11-22 MED ORDER — LIDOCAINE-EPINEPHRINE 1 %-1:100000 IJ SOLN
INTRAMUSCULAR | Status: DC | PRN
  Administered 2021-11-22: 10 mL

## 2021-11-22 MED ORDER — ONDANSETRON HCL 4 MG/2ML IJ SOLN
INTRAMUSCULAR | Status: AC
  Filled 2021-11-22: qty 2

## 2021-11-22 MED ORDER — PHENAZOPYRIDINE HCL 100 MG PO TABS
ORAL_TABLET | ORAL | Status: AC
  Filled 2021-11-22: qty 1

## 2021-11-22 MED ORDER — FENTANYL CITRATE (PF) 100 MCG/2ML IJ SOLN
INTRAMUSCULAR | Status: DC | PRN
  Administered 2021-11-22: 25 ug via INTRAVENOUS

## 2021-11-22 MED ORDER — EPHEDRINE SULFATE (PRESSORS) 50 MG/ML IJ SOLN
INTRAMUSCULAR | Status: DC | PRN
  Administered 2021-11-22 (×3): 10 mg via INTRAVENOUS

## 2021-11-22 MED ORDER — CEFAZOLIN SODIUM-DEXTROSE 2-4 GM/100ML-% IV SOLN
INTRAVENOUS | Status: AC
  Filled 2021-11-22: qty 100

## 2021-11-22 MED ORDER — CEFAZOLIN SODIUM-DEXTROSE 2-4 GM/100ML-% IV SOLN: 2.0000 g | INTRAVENOUS | Status: DC

## 2021-11-22 MED ORDER — PHENAZOPYRIDINE HCL 100 MG PO TABS
200.0000 mg | ORAL_TABLET | ORAL | Status: AC
  Administered 2021-11-22: 200 mg via ORAL

## 2021-11-22 MED ORDER — LACTATED RINGERS IV SOLN: INTRAVENOUS | Status: DC

## 2021-11-22 MED ORDER — DEXAMETHASONE SODIUM PHOSPHATE 10 MG/ML IJ SOLN
INTRAMUSCULAR | Status: AC
  Filled 2021-11-22: qty 1

## 2021-11-22 MED ORDER — CEFAZOLIN SODIUM-DEXTROSE 2-3 GM-%(50ML) IV SOLR
INTRAVENOUS | Status: DC | PRN
  Administered 2021-11-22: 2 g via INTRAVENOUS

## 2021-11-22 SURGICAL SUPPLY — 35 items
AGENT HMST KT MTR STRL THRMB (HEMOSTASIS)
BLADE CLIPPER SENSICLIP SURGIC (BLADE) ×3 IMPLANT
BLADE SURG 15 STRL LF DISP TIS (BLADE) ×2 IMPLANT
BLADE SURG 15 STRL SS (BLADE) ×3
DECANTER SPIKE VIAL GLASS SM (MISCELLANEOUS) IMPLANT
DEVICE CAPIO SLIM SINGLE (INSTRUMENTS) IMPLANT
ELECT REM PT RETURN 9FT ADLT (ELECTROSURGICAL)
ELECTRODE REM PT RTRN 9FT ADLT (ELECTROSURGICAL) IMPLANT
GAUZE 4X4 16PLY ~~LOC~~+RFID DBL (SPONGE) ×3 IMPLANT
GLOVE SURG ENC MOIS LTX SZ6 (GLOVE) ×3 IMPLANT
GLOVE SURG UNDER POLY LF SZ6.5 (GLOVE) ×3 IMPLANT
GOWN STRL REUS W/TWL LRG LVL3 (GOWN DISPOSABLE) ×3 IMPLANT
HIBICLENS CHG 4% 4OZ BTL (MISCELLANEOUS) ×3 IMPLANT
HOLDER FOLEY CATH W/STRAP (MISCELLANEOUS) ×3 IMPLANT
KIT TURNOVER CYSTO (KITS) ×3 IMPLANT
MANIFOLD NEPTUNE II (INSTRUMENTS) ×3 IMPLANT
NDL MAYO 6 CRC TAPER PT (NEEDLE) IMPLANT
NEEDLE HYPO 22GX1.5 SAFETY (NEEDLE) ×3 IMPLANT
NEEDLE MAYO 6 CRC TAPER PT (NEEDLE) IMPLANT
NS IRRIG 1000ML POUR BTL (IV SOLUTION) ×3 IMPLANT
PACK CYSTO (CUSTOM PROCEDURE TRAY) ×3 IMPLANT
PACK VAGINAL WOMENS (CUSTOM PROCEDURE TRAY) ×3 IMPLANT
RETRACTOR LONE STAR DISPOSABLE (INSTRUMENTS) ×3 IMPLANT
RETRACTOR STAY HOOK 5MM (MISCELLANEOUS) ×3 IMPLANT
SET IRRIG Y TYPE TUR BLADDER L (SET/KITS/TRAYS/PACK) IMPLANT
SURGIFLO W/THROMBIN 8M KIT (HEMOSTASIS) IMPLANT
SUT ABS MONO DBL WITH NDL 48IN (SUTURE) IMPLANT
SUT VIC AB 0 CT1 27 (SUTURE)
SUT VIC AB 0 CT1 27XBRD ANTBC (SUTURE) IMPLANT
SUT VIC AB 2-0 SH 27 (SUTURE)
SUT VIC AB 2-0 SH 27XBRD (SUTURE) IMPLANT
SUT VICRYL 2-0 SH 8X27 (SUTURE) ×3 IMPLANT
SYR BULB EAR ULCER 3OZ GRN STR (SYRINGE) ×3 IMPLANT
TOWEL OR 17X26 10 PK STRL BLUE (TOWEL DISPOSABLE) ×3 IMPLANT
TRAY FOLEY W/BAG SLVR 14FR LF (SET/KITS/TRAYS/PACK) ×3 IMPLANT

## 2021-11-22 NOTE — Anesthesia Procedure Notes (Signed)
Procedure Name: LMA Insertion Date/Time: 11/22/2021 9:12 AM Performed by: Verita Lamb, CRNA Pre-anesthesia Checklist: Patient identified, Emergency Drugs available, Suction available and Patient being monitored Patient Re-evaluated:Patient Re-evaluated prior to induction Oxygen Delivery Method: Circle system utilized Preoxygenation: Pre-oxygenation with 100% oxygen Induction Type: IV induction Ventilation: Mask ventilation without difficulty LMA: LMA inserted LMA Size: 3.0 Number of attempts: 1 Airway Equipment and Method: Bite block Placement Confirmation: positive ETCO2, CO2 detector and breath sounds checked- equal and bilateral Tube secured with: Tape Dental Injury: Teeth and Oropharynx as per pre-operative assessment

## 2021-11-22 NOTE — Discharge Instructions (Addendum)
POST OPERATIVE INSTRUCTIONS  General Instructions Recovery (not bed rest) will last approximately 6 weeks Walking is encouraged, but refrain from strenuous exercise/ housework/ heavy lifting. No lifting >10lbs  Nothing in the vagina- NO intercourse, tampons or douching Bathing:  Do not submerge in water (NO swimming, bath, hot tub, etc) until after your postop visit. You can shower starting the day after surgery.  No driving until you are not taking narcotic pain medicine and until your pain is well enough controlled that you can slam on the breaks or make sudden movements if needed.   Taking your medications Please take your acetaminophen and ibuprofen on a schedule for the first 48 hours. Take 600mg ibuprofen, then take 500mg acetaminophen 3 hours later, then continue to alternate ibuprofen and acetaminophen. That way you are taking each type of medication every 6 hours. Take the prescribed narcotic (oxycodone, tramadol, etc) as needed, with a maximum being every 4 hours.  Take a stool softener daily to keep your stools soft and preventing you from straining. If you have diarrhea, you decrease your stool softener. This is explained more below. We have prescribed you Miralax.  Reasons to Call the Nurse (see last page for phone numbers) Heavy Bleeding (changing your pad every 1-2 hours) Persistent nausea/vomiting Fever (100.4 degrees or more) Incision problems (pus or other fluid coming out, redness, warmth, increased pain)  Things to Expect After Surgery Mild to Moderate pain is normal during the first day or two after surgery. If prescribed, take Ibuprofen or Tylenol first and use the stronger medicine for "break-through" pain. You can overlap these medicines because they work differently.   Constipation   To Prevent Constipation:  Eat a well-balanced diet including protein, grains, fresh fruit and vegetables.  Drink plenty of fluids. Walk regularly.  Depending on specific instructions  from your physician: take Miralax daily and additionally you can add a stool softener (colace/ docusate) and fiber supplement. Continue as long as you're on pain medications.   To Treat Constipation:  If you do not have a bowel movement in 2 days after surgery, you can take 2 Tbs of Milk of Magnesia 1-2 times a day until you have a bowel movement. If diarrhea occurs, decrease the amount or stop the laxative. If no results with Milk of Magnesia, you can drink a bottle of magnesium citrate which you can purchase over the counter.  Fatigue:  This is a normal response to surgery and will improve with time.  Plan frequent rest periods throughout the day.  Gas Pain:  This is very common but can also be very painful! Drink warm liquids such as herbal teas, bouillon or soup. Walking will help you pass more gas.  Mylicon or Gas-X can be taken over the counter.  Leaking Urine:  Varying amounts of leakage may occur after surgery.  This should improve with time. Your bladder needs at least 3 months to recover from surgery. If you leak after surgery, be sure to mention this to your doctor at your post-op visit. If you were taking medications for overactive bladder prior to surgery, be sure to restart the medications immediately after surgery.  Incisions: If you have incisions on your abdomen, the skin glue will dissolve on its own over time. It is ok to gently rinse with soap and water over these incisions but do not scrub.  Catheter Approximately 50% of patients are unable to urinate after surgery and need to go home with a catheter. This allows your bladder to   rest so it can return to full function. If you go home with a catheter, the office will call to set up a voiding trial a few days after surgery. For most patients, by this visit, they are able to urinate on their own. Long term catheter use is rare.   Return to Work  As work demands and recovery times vary widely, it is hard to predict when you will want  to return to work. If you have a desk job with no strenuous physical activity, and if you would like to return sooner than generally recommended, discuss this with your provider or call our office.   Post op concerns  For non-emergent issues, please call the Urogynecology Nurse. Please leave a message and someone will contact you within one business day.  You can also send a message through [place].   AFTER HOURS (After 5:00 PM and on weekends):  For urgent matters that cannot wait until the next business day. Call our office [phone] and connect to the doctor on call.  Please reserve this for important issues.   **FOR ANY TRUE EMERGENCY ISSUES CALL 911 OR GO TO THE NEAREST EMERGENCY ROOM.** Please inform our office or the doctor on call of any emergency.     APPOINTMENTS: Call [phone]   No Tylenol/acetaminophen until 2:30pm or after   Post Anesthesia Home Care Instructions  Activity: Get plenty of rest for the remainder of the day. A responsible individual must stay with you for 24 hours following the procedure.  For the next 24 hours, DO NOT: -Drive a car -Paediatric nurse -Drink alcoholic beverages -Take any medication unless instructed by your physician -Make any legal decisions or sign important papers.  Meals: Start with liquid foods such as gelatin or soup. Progress to regular foods as tolerated. Avoid greasy, spicy, heavy foods. If nausea and/or vomiting occur, drink only clear liquids until the nausea and/or vomiting subsides. Call your physician if vomiting continues.  Special Instructions/Symptoms: Your throat may feel dry or sore from the anesthesia or the breathing tube placed in your throat during surgery. If this causes discomfort, gargle with warm salt water. The discomfort should disappear within 24 hours.  If you had a scopolamine patch placed behind your ear for the management of post- operative nausea and/or vomiting:  1. The medication in the patch  is effective for 72 hours, after which it should be removed.  Wrap patch in a tissue and discard in the trash. Wash hands thoroughly with soap and water. 2. You may remove the patch earlier than 72 hours if you experience unpleasant side effects which may include dry mouth, dizziness or visual disturbances. 3. Avoid touching the patch. Wash your hands with soap and water after contact with the patch.

## 2021-11-22 NOTE — Transfer of Care (Signed)
Immediate Anesthesia Transfer of Care Note  Patient: [name]  Procedure(s) Performed: ANTERIOR AND POSTERIOR REPAIR (Vagina ) CYSTOSCOPY (Bladder)  Patient Location: PACU  Anesthesia Type:General  Level of Consciousness: awake, alert  and patient cooperative  Airway & Oxygen Therapy: Patient Spontanous Breathing and Patient connected to face mask oxygen  Post-op Assessment: Report given to RN and Post -op Vital signs reviewed and stable  Post vital signs: Reviewed and stable  Last Vitals:  Vitals Value Taken Time  BP    Temp    Pulse 75 11/22/21 1002  Resp 17 11/22/21 1003  SpO2 95 % 11/22/21 1002  Vitals shown include unvalidated device data.  Last Pain:  Vitals:   11/22/21 0753  TempSrc: Oral         Complications: No notable events documented.

## 2021-11-22 NOTE — Op Note (Signed)
Operative Note  Preoperative Diagnosis: anterior vaginal prolapse and posterior vaginal prolapse  Postoperative Diagnosis: same  Procedures performed:  Anterior and posterior repair, cystoscopy  Implants: none  Attending Surgeon: Sherlene Shams, MD  Anesthesia: General LMA  Findings: 1. On vaginal exam, stage II anterior and posterior vaginal prolapse  2. On cystoscopy, normal bladder and urethra without injury, lesion or foreign body. Brisk bilateral ureteral efflux noted.    Specimens: none  Estimated blood loss: 25 mL  IV fluids: 600 mL  Urine output: 75 mL  Complications: none  Procedure in Detail:  After informed consent was obtained, the patient was taken to the operating room where general anesthesia was induced. She was placed in dorsal lithotomy position, taking care to avoid any traction of the extremities and prepped and draped in the usual sterile fashion. A self-retaining lonestar retractor was placed using four elastic blue stays.  After a foley catheter was inserted into the urethra, the location of the midurethra was palpated. Two Allis clamps were along the anterior vaginal wall defect. 1% lidocaine with epinephrine was injected into the vaginal mucosa.  A vertical incision was made between these two Allis clamps with a 15 blade scalpel.  Allis clamps were placed along this incision and Metzenbaum scissors were used to undermine the vaginal mucosa along the incision.  The vaginal mucosa was then sharply dissected off to the vesicovaginal septum bilaterally to the level of the pubic rami.  Anterior plication of the vesicovaginal septum was then performed using plicating sutures of 2-0 Vicryl. The vaginal mucosal edges were trimmed and the incision reapproximated with 2-0 Vicryl in a running fashion.    The Foley catheter was removed.  A 70-degree cystoscope was introduced, and 360-degree inspection revealed no injury, lesion or foreign body in the bladder.  Good  bilateral ureteral efflux was noted.  The bladder was drained and the cystoscope was removed.  The Foley catheter was reinserted.  Attention was then turned to the posterior vagina.  Two Allis clamps were placed in the midline of the posterior vaginal wall defect.  1% lidocaine with epinephrine was injected into the vaginal mucosa. A vertical incision was made between these clamps with a 15 blade scalpel.  The rectovaginal septum was then dissected off the vaginal mucosa bilaterally.  No enterocele was noted.  The rectovaginal septum was then reapproximated with plicating sutures of 2-0 Vicryl.  After placement of the first plication stitch two fingers were inserted into the vaginal to confirm adequate caliber.  The last distal stitch incorporated the perineal body in a U stitch fashion.  After plication, the excess vaginal mucosa was trimmed and the vaginal mucosa was reapproximated using 2-0 Vicryl sutures in a running fashion.  The vagina was copiously irrigated and hemostasis was noted.  Vaginal packing was not placed.  A rectal examination was normal and confirmed no sutures within the rectum.  The patient tolerated the procedure well.  She was awakened from anesthesia and transferred to the recovery room in stable condition. All counts were correct x 2.     [name], MD

## 2021-11-22 NOTE — Interval H&P Note (Signed)
History and Physical Interval Note:  11/22/2021 8:20 AM  [name]  has presented today for surgery, with the diagnosis of anterior and posterior vaginal prolapse.  The various methods of treatment have been discussed with the patient and family. After consideration of risks, benefits and other options for treatment, the patient has consented to  Procedure(s): ANTERIOR AND POSTERIOR REPAIR (N/A) CYSTOSCOPY (N/A) as a surgical intervention.  The patient's history has been reviewed, patient examined, no change in status, stable for surgery.  I have reviewed the patient's chart and labs.  Questions were answered to the patient's satisfaction.     [name]

## 2021-11-22 NOTE — Anesthesia Preprocedure Evaluation (Addendum)
Anesthesia Evaluation  Patient identified by MRN, date of birth, ID band Patient awake    Reviewed: Allergy & Precautions, NPO status , Patient's Chart, lab work & pertinent test results, reviewed documented beta blocker date and time   Airway Mallampati: II  TM Distance: >3 FB Neck ROM: Full    Dental  (+) Teeth Intact, Dental Advisory Given   Pulmonary neg pulmonary ROS,    Pulmonary exam normal breath sounds clear to auscultation       Cardiovascular hypertension, Pt. on medications and Pt. on home beta blockers + DVT  + dysrhythmias Atrial Fibrillation  Rhythm:Irregular Rate:Abnormal     Neuro/Psych  Headaches, PSYCHIATRIC DISORDERS Anxiety Depression    GI/Hepatic Neg liver ROS, GERD  Medicated,  Endo/Other  Hypothyroidism   Renal/GU negative Renal ROS     Musculoskeletal  (+) Arthritis , Rheumatoid disorders,    Abdominal   Peds  Hematology negative hematology ROS (+)   Anesthesia Other Findings Day of surgery medications reviewed with the patient.  Reproductive/Obstetrics anterior and posterior vaginal prolapse                            Anesthesia Physical Anesthesia Plan  ASA: 3  Anesthesia Plan: General   Post-op Pain Management: Tylenol PO (pre-op)   Induction: Intravenous  PONV Risk Score and Plan: 3 and Dexamethasone and Ondansetron  Airway Management Planned: LMA  Additional Equipment:   Intra-op Plan:   Post-operative Plan: Extubation in OR  Informed Consent: I have reviewed the patients History and Physical, chart, labs and discussed the procedure including the risks, benefits and alternatives for the proposed anesthesia with the patient or authorized representative who has indicated his/her understanding and acceptance.     Dental advisory given  Plan Discussed with: CRNA  Anesthesia Plan Comments:         Anesthesia Quick Evaluation

## 2021-11-22 NOTE — Telephone Encounter (Signed)
[name] underwent anterior and posterior repair, cystoscopy on 11/22/21.   She failed her voiding trial.  314ml was backfilled into the bladder Voided 113ml  PVR by bladder scan was 289ml.   She was discharged with a catheter. Please call her for a routine post op check and to schedule a voiding trial by Thurs (2/9). Thanks!  [name], MD

## 2021-11-23 ENCOUNTER — Encounter (HOSPITAL_BASED_OUTPATIENT_CLINIC_OR_DEPARTMENT_OTHER): Payer: Self-pay | Admitting: Obstetrics and Gynecology

## 2021-11-23 NOTE — Anesthesia Postprocedure Evaluation (Signed)
Anesthesia Post Note  Patient: [name]  Procedure(s) Performed: ANTERIOR AND POSTERIOR REPAIR (Vagina ) CYSTOSCOPY (Bladder)     Patient location during evaluation: PACU Anesthesia Type: General Level of consciousness: awake and alert Pain management: pain level controlled Vital Signs Assessment: post-procedure vital signs reviewed and stable Respiratory status: spontaneous breathing, nonlabored ventilation, respiratory function stable and patient connected to nasal cannula oxygen Cardiovascular status: blood pressure returned to baseline and stable Postop Assessment: no apparent nausea or vomiting Anesthetic complications: no   No notable events documented.  Last Vitals:  Vitals:   11/22/21 1030 11/22/21 1154  BP: (!) 133/58 (!) 144/73  Pulse: 66 72  Resp: 18 14  Temp:  (!) 36.4 C  SpO2: 100% 100%    Last Pain:  Vitals:   11/22/21 1130  TempSrc:   PainSc: 0-No pain                 Santa Lighter

## 2021-11-23 NOTE — Telephone Encounter (Signed)
Post- Op Call  [name] underwent anterior and posterior repair, cystoscopy on 11/22/21 with Dr Wannetta Sender. The patient reports that her pain is controlled. She is took Tylenol this morning but has not needed anything else. She reports vaginal bleeding described as small. She has had a bowel movement and is taking miralax for a bowel regimen. She was discharged with a catheter and will return on Friday 2/10 for a voiding trial.   [name], RN

## 2021-11-24 ENCOUNTER — Encounter (HOSPITAL_BASED_OUTPATIENT_CLINIC_OR_DEPARTMENT_OTHER): Payer: Self-pay | Admitting: *Deleted

## 2021-11-26 ENCOUNTER — Ambulatory Visit (INDEPENDENT_AMBULATORY_CARE_PROVIDER_SITE_OTHER): Payer: Medicare HMO

## 2021-11-26 ENCOUNTER — Other Ambulatory Visit: Payer: Self-pay

## 2021-11-26 VITALS — BP 188/83 | HR 71

## 2021-11-26 DIAGNOSIS — N816 Rectocele: Secondary | ICD-10-CM

## 2021-11-26 NOTE — Patient Instructions (Signed)
Keep f/u appt

## 2021-11-26 NOTE — Progress Notes (Signed)
Urogyn Nurse Voiding Trial Note  [name] underwent ANTERIOR AND POSTERIOR REPAIR with Cysto on 11/22/2021.  She presents today for a voiding trial .   Patient was identified with 2 indicators. 254ml of NS was instilled into the bladder via the catheter. The catheter was removed and patient was instructed to void into the urinary hat. She voided 27ml. The post void residual measured by bladder scan was 1ml. She passed the voiding trial and a catheter was not replaced.   The patient received aftercare instructions and will follow up as scheduled.

## 2021-11-29 DIAGNOSIS — M0609 Rheumatoid arthritis without rheumatoid factor, multiple sites: Secondary | ICD-10-CM | POA: Diagnosis not present

## 2021-11-29 DIAGNOSIS — Z79899 Other long term (current) drug therapy: Secondary | ICD-10-CM | POA: Diagnosis not present

## 2021-11-29 DIAGNOSIS — M150 Primary generalized (osteo)arthritis: Secondary | ICD-10-CM | POA: Diagnosis not present

## 2021-11-29 DIAGNOSIS — R251 Tremor, unspecified: Secondary | ICD-10-CM | POA: Diagnosis not present

## 2021-11-29 DIAGNOSIS — E7800 Pure hypercholesterolemia, unspecified: Secondary | ICD-10-CM | POA: Diagnosis not present

## 2021-11-29 DIAGNOSIS — I10 Essential (primary) hypertension: Secondary | ICD-10-CM | POA: Diagnosis not present

## 2021-12-03 ENCOUNTER — Other Ambulatory Visit: Payer: Self-pay | Admitting: Internal Medicine

## 2021-12-22 ENCOUNTER — Encounter: Payer: Self-pay | Admitting: Internal Medicine

## 2021-12-22 ENCOUNTER — Other Ambulatory Visit: Payer: Self-pay

## 2021-12-22 ENCOUNTER — Ambulatory Visit (INDEPENDENT_AMBULATORY_CARE_PROVIDER_SITE_OTHER): Payer: Medicare HMO | Admitting: Internal Medicine

## 2021-12-22 VITALS — BP 142/70 | HR 52 | Temp 98.3°F | Ht 65.0 in | Wt 138.0 lb

## 2021-12-22 DIAGNOSIS — I10 Essential (primary) hypertension: Secondary | ICD-10-CM

## 2021-12-22 DIAGNOSIS — E538 Deficiency of other specified B group vitamins: Secondary | ICD-10-CM

## 2021-12-22 DIAGNOSIS — Z0001 Encounter for general adult medical examination with abnormal findings: Secondary | ICD-10-CM

## 2021-12-22 DIAGNOSIS — E559 Vitamin D deficiency, unspecified: Secondary | ICD-10-CM

## 2021-12-22 DIAGNOSIS — E039 Hypothyroidism, unspecified: Secondary | ICD-10-CM | POA: Diagnosis not present

## 2021-12-22 DIAGNOSIS — R7302 Impaired glucose tolerance (oral): Secondary | ICD-10-CM | POA: Diagnosis not present

## 2021-12-22 DIAGNOSIS — E7800 Pure hypercholesterolemia, unspecified: Secondary | ICD-10-CM | POA: Diagnosis not present

## 2021-12-22 LAB — CBC WITH DIFFERENTIAL/PLATELET
Basophils Absolute: 0.1 10*3/uL (ref 0.0–0.1)
Basophils Relative: 1.1 % (ref 0.0–3.0)
Eosinophils Absolute: 0.3 10*3/uL (ref 0.0–0.7)
Eosinophils Relative: 4.6 % (ref 0.0–5.0)
HCT: 34.2 % — ABNORMAL LOW (ref 36.0–46.0)
Hemoglobin: 11.9 g/dL — ABNORMAL LOW (ref 12.0–15.0)
Lymphocytes Relative: 16.1 % (ref 12.0–46.0)
Lymphs Abs: 1.1 10*3/uL (ref 0.7–4.0)
MCHC: 34.6 g/dL (ref 30.0–36.0)
MCV: 96.2 fl (ref 78.0–100.0)
Monocytes Absolute: 0.7 10*3/uL (ref 0.1–1.0)
Monocytes Relative: 9.6 % (ref 3.0–12.0)
Neutro Abs: 4.8 10*3/uL (ref 1.4–7.7)
Neutrophils Relative %: 68.6 % (ref 43.0–77.0)
Platelets: 275 10*3/uL (ref 150.0–400.0)
RBC: 3.56 Mil/uL — ABNORMAL LOW (ref 3.87–5.11)
RDW: 14.8 % (ref 11.5–15.5)
WBC: 7 10*3/uL (ref 4.0–10.5)

## 2021-12-22 LAB — HEPATIC FUNCTION PANEL
ALT: 18 U/L (ref 0–35)
AST: 20 U/L (ref 0–37)
Albumin: 4.1 g/dL (ref 3.5–5.2)
Alkaline Phosphatase: 91 U/L (ref 39–117)
Bilirubin, Direct: 0.1 mg/dL (ref 0.0–0.3)
Total Bilirubin: 0.4 mg/dL (ref 0.2–1.2)
Total Protein: 6.7 g/dL (ref 6.0–8.3)

## 2021-12-22 LAB — LIPID PANEL
Cholesterol: 103 mg/dL (ref 0–200)
HDL: 41.3 mg/dL (ref >39.00)
LDL Cholesterol: 50 mg/dL (ref 0–99)
NonHDL: 61.84
Total CHOL/HDL Ratio: 2
Triglycerides: 60 mg/dL (ref 0.0–149.0)
VLDL: 12 mg/dL (ref 0.0–40.0)

## 2021-12-22 LAB — URINALYSIS, ROUTINE W REFLEX MICROSCOPIC
Bilirubin Urine: NEGATIVE
Hgb urine dipstick: NEGATIVE
Ketones, ur: NEGATIVE
Nitrite: NEGATIVE
RBC / HPF: NONE SEEN (ref 0–?)
Specific Gravity, Urine: <=1.005 — AB (ref 1.000–1.030)
Total Protein, Urine: NEGATIVE
Urine Glucose: NEGATIVE
Urobilinogen, UA: 0.2 (ref 0.0–1.0)
pH: 6.5 (ref 5.0–8.0)

## 2021-12-22 LAB — HEMOGLOBIN A1C: Hgb A1c MFr Bld: 5.5 % (ref 4.6–6.5)

## 2021-12-22 LAB — BASIC METABOLIC PANEL
BUN: 21 mg/dL (ref 6–23)
CO2: 27 mEq/L (ref 19–32)
Calcium: 9.3 mg/dL (ref 8.4–10.5)
Chloride: 96 mEq/L (ref 96–112)
Creatinine, Ser: 0.77 mg/dL (ref 0.40–1.20)
GFR: 74.83 mL/min (ref >60.00)
Glucose, Bld: 102 mg/dL — ABNORMAL HIGH (ref 70–99)
Potassium: 3.2 mEq/L — ABNORMAL LOW (ref 3.5–5.1)
Sodium: 131 mEq/L — ABNORMAL LOW (ref 135–145)

## 2021-12-22 LAB — TSH: TSH: 1.74 u[IU]/mL (ref 0.35–5.50)

## 2021-12-22 LAB — VITAMIN D 25 HYDROXY (VIT D DEFICIENCY, FRACTURES): VITD: 82.26 ng/mL (ref 30.00–100.00)

## 2021-12-22 LAB — T4, FREE: Free T4: 1.01 ng/dL (ref 0.60–1.60)

## 2021-12-22 LAB — VITAMIN B12: Vitamin B-12: 718 pg/mL (ref 211–911)

## 2021-12-22 NOTE — Assessment & Plan Note (Addendum)
Lab Results  ?Component Value Date  ? [id number] >1506 (H) 12/16/2020  ? ?overcontrolled, cont oral replacement - b12 1000 mcg at reduced qod ?

## 2021-12-22 NOTE — Patient Instructions (Addendum)
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
## 2021-12-22 NOTE — Assessment & Plan Note (Addendum)
Last vitamin D ?Lab Results  ?Component Value Date  ? VD25OH >120 12/16/2020  ? ?overcontrolled, cont oral replacement to 1000 u qd Vit D3 ? ?

## 2021-12-26 ENCOUNTER — Encounter: Payer: Self-pay | Admitting: Internal Medicine

## 2021-12-26 NOTE — Assessment & Plan Note (Addendum)
BP Readings from Last 3 Encounters:  ?12/22/21 (!) 142/70  ?11/26/21 (!) 188/83  ?11/22/21 (!) 144/73  ? ?Mild uncontrolled today, pt states controlled at home, pt to continue medical treatment hct, zestroretic, lopressor ? ?

## 2021-12-26 NOTE — Assessment & Plan Note (Signed)
Lab Results  ?Component Value Date  ? [place] 50 12/22/2021  ? ?Stable, pt to continue current statin pravacho ? ?

## 2021-12-26 NOTE — Assessment & Plan Note (Addendum)

## 2021-12-26 NOTE — Assessment & Plan Note (Addendum)
Lab Results  ?Component Value Date  ? HGBA1C 5.5 12/22/2021  ? ?Stable, pt to continue current medical treatment  - diet ? ?

## 2021-12-26 NOTE — Assessment & Plan Note (Signed)
Lab Results  ?Component Value Date  ? TSH 1.74 12/22/2021  ? ?Stable, pt to continue levothyroxine ? ?

## 2021-12-27 ENCOUNTER — Telehealth: Payer: Self-pay

## 2021-12-27 NOTE — Telephone Encounter (Signed)
Pt is calling to make Dr. Jenny Reichmann aware that she took 2 hydrochlorothiazide (HYDRODIURIL) 25 MG tablet pt states that it relieved the chest pressure and brought her BP down 119/72. ? ?FYI ?

## 2022-01-04 ENCOUNTER — Encounter: Payer: Self-pay | Admitting: Obstetrics and Gynecology

## 2022-01-04 ENCOUNTER — Other Ambulatory Visit: Payer: Self-pay

## 2022-01-04 ENCOUNTER — Ambulatory Visit (INDEPENDENT_AMBULATORY_CARE_PROVIDER_SITE_OTHER): Payer: Medicare HMO | Admitting: Obstetrics and Gynecology

## 2022-01-04 VITALS — BP 157/76 | HR 68

## 2022-01-04 DIAGNOSIS — Z9889 Other specified postprocedural states: Secondary | ICD-10-CM

## 2022-01-04 NOTE — Progress Notes (Signed)
[facility] ? ?Date of Visit: 01/04/2022 ? ?History of Present Illness: Ms. Lippe is a 76 y.o. female scheduled today for a post-operative visit.  ?? Surgery: s/p anterior and posterior repair with cystoscopy on 11/22/21 ?? She did not pass her postoperative void trial. Returned 11/26/21 to office and passed repeat voiding trial.  ?? Postoperative course has been uncomplicated.  ? ?Today she reports she is feeling well.  ? ?UTI in the last 6 weeks? No  ?Pain? No  ?She has returned to her normal activity (except for postop restrictions) ?Vaginal bulge? No  ?Stress incontinence: No  ?Urgency/frequency: No  ?Urge incontinence: No  ?Voiding dysfunction: No  ?Bowel issues: No  ? ?Subjective Success: Do you usually have a bulge or something falling out that you can see or feel in the vaginal area? No  ?Retreatment Success: Any retreatment with surgery or pessary for any compartment? No  ? ? ?Medications: She has a current medication list which includes the following prescription(s): albuterol, ascorbic acid, aspirin, vitamin d3, cyanocobalamin, folic acid, hydrochlorothiazide, ibuprofen, levothyroxine, lisinopril-hydrochlorothiazide, methotrexate, metoprolol tartrate, multiple vitamins-minerals, pantoprazole, polyethylene glycol powder, pravastatin, and topiramate.  ? ?Allergies: Patient is allergic to allegra [fexofenadine], atenolol, ciprofloxacin, codeine, cortisone, doxycycline, fosamax [alendronate], klonopin [clonazepam], lisinopril, prednisone, ramipril, sulfa antibiotics, sulfamethoxazole-trimethoprim, tizanidine, verapamil, and chocolate.  ? ?Physical Exam: ?BP (!) 157/76   Pulse 68   ? ?Pelvic Examination: Vagina: Incisions healing well. Sutures are not present at incision line and there is not granulation tissue. No tenderness along the anterior or posterior vagina. No apical tenderness. No pelvic masses.  ? ?POP-Q: ?POP-Q ? ?-2.5  ?                                          Aa   ?-2.5 ?                                           Ba  ?-7.5  ?                                            C  ? ?2.5  ?                                          Gh  ?3.5  ?                                          Pb  ?7.5  ?                                          tvl  ? ?-3  ?  Ap  ?-3  ?                                          Bp  ?   ?                                            D  ? ? ?--------------------------------------------------------- ? ?Assessment and Plan:  ?1. Post-operative state   ? ? ?- Well healed.  ?- The patient was given a copy of her operative note for her records. ?- Can resume regular activity including exercise and intercourse,  if desired.  ?- Discussed avoidance of heavy lifting and straining long term to reduce the risk of recurrence.  ? ?All questions answered.  ? ?[name], MD ? ?

## 2022-02-11 ENCOUNTER — Ambulatory Visit (INDEPENDENT_AMBULATORY_CARE_PROVIDER_SITE_OTHER): Payer: Medicare HMO

## 2022-02-11 DIAGNOSIS — Z0000 Encounter for general adult medical examination without abnormal findings: Secondary | ICD-10-CM | POA: Diagnosis not present
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
## 2022-02-11 NOTE — Patient Instructions (Signed)
[name] , ?Thank you for taking time to come for your Medicare Wellness Visit. I appreciate your ongoing commitment to your health goals. Please review the following plan we discussed and let me know if I can assist you in the future.  ? ?Screening recommendations/referrals: ?Colonoscopy: no longer required  ?Mammogram: no longer required  ?Bone Density: 12/13/2018 ?Recommended yearly ophthalmology/optometry visit for glaucoma screening and checkup ?Recommended yearly dental visit for hygiene and checkup ? ?Vaccinations: ?Influenza vaccine: completed  ?Pneumococcal vaccine: completed  ?Tdap vaccine: 03/28/2014 ?Shingles vaccine: will consider    ? ?Advanced directives: none  ? ?Conditions/risks identified: none  ? ?Next appointment: none  ? ? ?Preventive Care 65 Years and Older, Female ?Preventive care refers to lifestyle choices and visits with your health care provider that can promote health and wellness. ?What does preventive care include? ?A yearly physical exam. This is also called an annual well check. ?Dental exams once or twice a year. ?Routine eye exams. Ask your health care provider how often you should have your eyes checked. ?Personal lifestyle choices, including: ?Daily care of your teeth and gums. ?Regular physical activity. ?Eating a healthy diet. ?Avoiding tobacco and drug use. ?Limiting alcohol use. ?Practicing safe sex. ?Taking low-dose aspirin every day. ?Taking vitamin and mineral supplements as recommended by your health care provider. ?What happens during an annual well check? ?The services and screenings done by your health care provider during your annual well check will depend on your age, overall health, lifestyle risk factors, and family history of disease. ?Counseling  ?Your health care provider may ask you questions about your: ?Alcohol use. ?Tobacco use. ?Drug use. ?Emotional well-being. ?Home and relationship well-being. ?Sexual activity. ?Eating habits. ?History of falls. ?Memory  and ability to understand (cognition). ?Work and work Statistician. ?Reproductive health. ?Screening  ?You may have the following tests or measurements: ?Height, weight, and BMI. ?Blood pressure. ?Lipid and cholesterol levels. These may be checked every 5 years, or more frequently if you are over 41 years old. ?Skin check. ?Lung cancer screening. You may have this screening every year starting at age 49 if you have a 30-pack-year history of smoking and currently smoke or have quit within the past 15 years. ?Fecal occult blood test (FOBT) of the stool. You may have this test every year starting at age 56. ?Flexible sigmoidoscopy or colonoscopy. You may have a sigmoidoscopy every 5 years or a colonoscopy every 10 years starting at age 33. ?Hepatitis C blood test. ?Hepatitis B blood test. ?Sexually transmitted disease (STD) testing. ?Diabetes screening. This is done by checking your blood sugar (glucose) after you have not eaten for a while (fasting). You may have this done every 1-3 years. ?Bone density scan. This is done to screen for osteoporosis. You may have this done starting at age 94. ?Mammogram. This may be done every 1-2 years. Talk to your health care provider about how often you should have regular mammograms. ?Talk with your health care provider about your test results, treatment options, and if necessary, the need for more tests. ?Vaccines  ?Your health care provider may recommend certain vaccines, such as: ?Influenza vaccine. This is recommended every year. ?Tetanus, diphtheria, and acellular pertussis (Tdap, Td) vaccine. You may need a Td booster every 10 years. ?Zoster vaccine. You may need this after age 73. ?Pneumococcal 13-valent conjugate (PCV13) vaccine. One dose is recommended after age 38. ?Pneumococcal polysaccharide (PPSV23) vaccine. One dose is recommended after age 33. ?Talk to your health care provider about which screenings and  vaccines you need and how often you need them. ?This  information is not intended to replace advice given to you by your health care provider. Make sure you discuss any questions you have with your health care provider. ?Document Released: 10/30/2015 Document Revised: 06/22/2016 Document Reviewed: 08/04/2015 ?Elsevier Interactive Patient Education ? 2017 [place]. ? ?Fall Prevention in the Home ?Falls can cause injuries. They can happen to people of all ages. There are many things you can do to make your home safe and to help prevent falls. ?What can I do on the outside of my home? ?Regularly fix the edges of walkways and driveways and fix any cracks. ?Remove anything that might make you trip as you walk through a door, such as a raised step or threshold. ?Trim any bushes or trees on the path to your home. ?Use bright outdoor lighting. ?Clear any walking paths of anything that might make someone trip, such as rocks or tools. ?Regularly check to see if handrails are loose or broken. Make sure that both sides of any steps have handrails. ?Any raised decks and porches should have guardrails on the edges. ?Have any leaves, snow, or ice cleared regularly. ?Use sand or salt on walking paths during winter. ?Clean up any spills in your garage right away. This includes oil or grease spills. ?What can I do in the bathroom? ?Use night lights. ?Install grab bars by the toilet and in the tub and shower. Do not use towel bars as grab bars. ?Use non-skid mats or decals in the tub or shower. ?If you need to sit down in the shower, use a plastic, non-slip stool. ?Keep the floor dry. Clean up any water that spills on the floor as soon as it happens. ?Remove soap buildup in the tub or shower regularly. ?Attach bath mats securely with double-sided non-slip rug tape. ?Do not have throw rugs and other things on the floor that can make you trip. ?What can I do in the bedroom? ?Use night lights. ?Make sure that you have a light by your bed that is easy to reach. ?Do not use any sheets or  blankets that are too big for your bed. They should not hang down onto the floor. ?Have a firm chair that has side arms. You can use this for support while you get dressed. ?Do not have throw rugs and other things on the floor that can make you trip. ?What can I do in the kitchen? ?Clean up any spills right away. ?Avoid walking on wet floors. ?Keep items that you use a lot in easy-to-reach places. ?If you need to reach something above you, use a strong step stool that has a grab bar. ?Keep electrical cords out of the way. ?Do not use floor polish or wax that makes floors slippery. If you must use wax, use non-skid floor wax. ?Do not have throw rugs and other things on the floor that can make you trip. ?What can I do with my stairs? ?Do not leave any items on the stairs. ?Make sure that there are handrails on both sides of the stairs and use them. Fix handrails that are broken or loose. Make sure that handrails are as long as the stairways. ?Check any carpeting to make sure that it is firmly attached to the stairs. Fix any carpet that is loose or worn. ?Avoid having throw rugs at the top or bottom of the stairs. If you do have throw rugs, attach them to the floor with carpet tape. ?Make  sure that you have a light switch at the top of the stairs and the bottom of the stairs. If you do not have them, ask someone to add them for you. ?What else can I do to help prevent falls? ?Wear shoes that: ?Do not have high heels. ?Have rubber bottoms. ?Are comfortable and fit you well. ?Are closed at the toe. Do not wear sandals. ?If you use a stepladder: ?Make sure that it is fully opened. Do not climb a closed stepladder. ?Make sure that both sides of the stepladder are locked into place. ?Ask someone to hold it for you, if possible. ?Clearly mark and make sure that you can see: ?Any grab bars or handrails. ?First and last steps. ?Where the edge of each step is. ?Use tools that help you move around (mobility aids) if they are  needed. These include: ?Canes. ?Walkers. ?Scooters. ?Crutches. ?Turn on the lights when you go into a dark area. Replace any light bulbs as soon as they burn out. ?Set up your furniture so you have a clear path.

## 2022-03-02 DIAGNOSIS — H16223 Keratoconjunctivitis sicca, not specified as Sjogren's, bilateral: Secondary | ICD-10-CM | POA: Diagnosis not present

## 2022-03-02 DIAGNOSIS — H35033 Hypertensive retinopathy, bilateral: Secondary | ICD-10-CM | POA: Diagnosis not present

## 2022-03-02 DIAGNOSIS — H0102A Squamous blepharitis right eye, upper and lower eyelids: Secondary | ICD-10-CM | POA: Diagnosis not present

## 2022-03-02 DIAGNOSIS — H353131 Nonexudative age-related macular degeneration, bilateral, early dry stage: Secondary | ICD-10-CM | POA: Diagnosis not present

## 2022-03-02 DIAGNOSIS — H0102B Squamous blepharitis left eye, upper and lower eyelids: Secondary | ICD-10-CM | POA: Diagnosis not present

## 2022-03-02 DIAGNOSIS — H40013 Open angle with borderline findings, low risk, bilateral: Secondary | ICD-10-CM | POA: Diagnosis not present

## 2022-04-14 DIAGNOSIS — E7800 Pure hypercholesterolemia, unspecified: Secondary | ICD-10-CM | POA: Diagnosis not present

## 2022-04-14 DIAGNOSIS — I10 Essential (primary) hypertension: Secondary | ICD-10-CM | POA: Diagnosis not present

## 2022-04-14 DIAGNOSIS — M150 Primary generalized (osteo)arthritis: Secondary | ICD-10-CM | POA: Diagnosis not present

## 2022-04-14 DIAGNOSIS — M25531 Pain in right wrist: Secondary | ICD-10-CM | POA: Diagnosis not present

## 2022-04-14 DIAGNOSIS — M0609 Rheumatoid arthritis without rheumatoid factor, multiple sites: Secondary | ICD-10-CM | POA: Diagnosis not present

## 2022-04-14 DIAGNOSIS — Z79899 Other long term (current) drug therapy: Secondary | ICD-10-CM | POA: Diagnosis not present

## 2022-04-14 DIAGNOSIS — R251 Tremor, unspecified: Secondary | ICD-10-CM | POA: Diagnosis not present

## 2022-05-09 ENCOUNTER — Telehealth: Payer: Self-pay | Admitting: Gastroenterology

## 2022-05-09 NOTE — Telephone Encounter (Signed)
Patient called.  She was experiencing constipation and took two doses of Metamucil and now she can't stop going to the bathroom.  She said this has been going on for five days now.  She said she is miserable and her bottom "feels like a red hot tomato."  Please call patient and advise.  Thank you.

## 2022-05-09 NOTE — Telephone Encounter (Signed)
Patient called in stating she had taken two doses of metamucil, that led to diarrhea for the last 4-5 days. She has stopped taking metamucil since then. Denies n/v, or abdominal pain. She has been advised that she can take imodium OTC prn, drink plenty of fluids, stick with a bland diet, and call back if symptoms are unrelieved. Pt verbalized all understanding.

## 2022-05-11 ENCOUNTER — Telehealth: Payer: Self-pay | Admitting: Gastroenterology

## 2022-05-11 NOTE — Telephone Encounter (Signed)
Spoke with pt regarding MD recommendations. Pt verbalized all understanding.  

## 2022-05-11 NOTE — Telephone Encounter (Signed)
Stop PeptoBismol as it can make the stool dark Imodium tid prn, Gas-X tid prn Bland low fat, lactose free, no raw fruits, no raw vegetables until diarrhea resolves Push fluids Contact us if symptoms are not improving

## 2022-05-11 NOTE — Telephone Encounter (Signed)
Patient called in with complaints of ongoing diarrhea & gas for the last week. She has tried both pepto & imodium prn with only slight relief. Her stool is darker today which is new (hasn't taken pepto since earlier in the week) and is concerned. Denies abd pain, n/v. Patient advised to continue to push fluids & stick with a bland diet. Last OV was 01/28/20 & EGD 02/11/20 with Dr. Fuller Plan. Will route to MD.

## 2022-05-12 NOTE — Telephone Encounter (Signed)
Patient stated her stools are still black, however not experiencing any new symptoms. Patient advised to call back if symptoms worsen or not resolved over the weekend given that she had taken pepto this week.

## 2022-05-12 NOTE — Telephone Encounter (Signed)
Patient called to let you know she has not had any changes and her stool is really black. She said she will call back next week to follow up.

## 2022-05-16 ENCOUNTER — Other Ambulatory Visit: Payer: Self-pay

## 2022-05-16 DIAGNOSIS — E876 Hypokalemia: Secondary | ICD-10-CM | POA: Diagnosis not present

## 2022-05-16 DIAGNOSIS — D649 Anemia, unspecified: Secondary | ICD-10-CM | POA: Diagnosis not present

## 2022-05-16 DIAGNOSIS — Z85038 Personal history of other malignant neoplasm of large intestine: Secondary | ICD-10-CM | POA: Insufficient documentation

## 2022-05-16 DIAGNOSIS — E871 Hypo-osmolality and hyponatremia: Principal | ICD-10-CM | POA: Insufficient documentation

## 2022-05-16 DIAGNOSIS — R262 Difficulty in walking, not elsewhere classified: Secondary | ICD-10-CM | POA: Insufficient documentation

## 2022-05-16 DIAGNOSIS — E860 Dehydration: Secondary | ICD-10-CM | POA: Insufficient documentation

## 2022-05-16 DIAGNOSIS — Z7982 Long term (current) use of aspirin: Secondary | ICD-10-CM | POA: Diagnosis not present

## 2022-05-16 DIAGNOSIS — R2681 Unsteadiness on feet: Secondary | ICD-10-CM | POA: Diagnosis not present

## 2022-05-16 DIAGNOSIS — R197 Diarrhea, unspecified: Secondary | ICD-10-CM | POA: Diagnosis not present

## 2022-05-16 DIAGNOSIS — I4891 Unspecified atrial fibrillation: Secondary | ICD-10-CM | POA: Diagnosis not present

## 2022-05-16 DIAGNOSIS — E878 Other disorders of electrolyte and fluid balance, not elsewhere classified: Secondary | ICD-10-CM | POA: Diagnosis not present

## 2022-05-16 DIAGNOSIS — R109 Unspecified abdominal pain: Secondary | ICD-10-CM | POA: Diagnosis not present

## 2022-05-16 DIAGNOSIS — E039 Hypothyroidism, unspecified: Secondary | ICD-10-CM | POA: Insufficient documentation

## 2022-05-16 DIAGNOSIS — Z86718 Personal history of other venous thrombosis and embolism: Secondary | ICD-10-CM | POA: Insufficient documentation

## 2022-05-16 DIAGNOSIS — Z79899 Other long term (current) drug therapy: Secondary | ICD-10-CM | POA: Diagnosis not present

## 2022-05-16 DIAGNOSIS — I10 Essential (primary) hypertension: Secondary | ICD-10-CM | POA: Diagnosis not present

## 2022-05-16 NOTE — Telephone Encounter (Signed)
Patient called in stating she is no longer experiencing diarrhea or dark stools. She states she does have a small amount of bright red bleeding when she wipes. Pt does have internal hemorrhoids. She has been advised to do a sitz bath, and use prep H/recticare cream to help with irritation, and keep her bottom dry. Pt will call back if she has any other questions or concerns.

## 2022-05-16 NOTE — Telephone Encounter (Signed)
Patient called back to let you know that her stools are not dark like they were but she is still experiencing bleeding and soreness of her bottom along with nausea.  Please call patient and advise.  Thank you.

## 2022-05-16 NOTE — ED Triage Notes (Signed)
Pt POV c/o diarrhea x9 days. Also c/o intermittent nausea. Intermittent feeling light headed, not at time of triage.  Feels like "it is burning all inside my body." Saw PCP and was prescribed imodium, provided some relief.

## 2022-05-17 ENCOUNTER — Encounter (HOSPITAL_BASED_OUTPATIENT_CLINIC_OR_DEPARTMENT_OTHER): Payer: Self-pay | Admitting: Emergency Medicine

## 2022-05-17 ENCOUNTER — Other Ambulatory Visit: Payer: Self-pay

## 2022-05-17 ENCOUNTER — Observation Stay (HOSPITAL_BASED_OUTPATIENT_CLINIC_OR_DEPARTMENT_OTHER)
Admission: EM | Admit: 2022-05-17 | Discharge: 2022-05-18 | Disposition: A | Discharge status: Home / Self Care | Payer: Medicare HMO | Attending: Internal Medicine | Admitting: Internal Medicine

## 2022-05-17 ENCOUNTER — Emergency Department (HOSPITAL_BASED_OUTPATIENT_CLINIC_OR_DEPARTMENT_OTHER): Payer: Medicare HMO

## 2022-05-17 DIAGNOSIS — E871 Hypo-osmolality and hyponatremia: Secondary | ICD-10-CM | POA: Diagnosis not present

## 2022-05-17 DIAGNOSIS — E876 Hypokalemia: Secondary | ICD-10-CM

## 2022-05-17 DIAGNOSIS — R109 Unspecified abdominal pain: Secondary | ICD-10-CM | POA: Diagnosis not present

## 2022-05-17 DIAGNOSIS — E785 Hyperlipidemia, unspecified: Secondary | ICD-10-CM | POA: Diagnosis present

## 2022-05-17 DIAGNOSIS — R197 Diarrhea, unspecified: Secondary | ICD-10-CM

## 2022-05-17 DIAGNOSIS — E860 Dehydration: Secondary | ICD-10-CM

## 2022-05-17 DIAGNOSIS — E878 Other disorders of electrolyte and fluid balance, not elsewhere classified: Secondary | ICD-10-CM

## 2022-05-17 DIAGNOSIS — I10 Essential (primary) hypertension: Secondary | ICD-10-CM

## 2022-05-17 DIAGNOSIS — E039 Hypothyroidism, unspecified: Secondary | ICD-10-CM | POA: Diagnosis present

## 2022-05-17 LAB — BASIC METABOLIC PANEL
Anion gap: 6 (ref 5–15)
Anion gap: 5 (ref 5–15)
Anion gap: 9 (ref 5–15)
Anion gap: 5 (ref 5–15)
BUN: 10 mg/dL (ref 8–23)
BUN: 9 mg/dL (ref 8–23)
BUN: 8 mg/dL (ref 8–23)
BUN: 9 mg/dL (ref 8–23)
CO2: 23 mmol/L (ref 22–32)
CO2: 23 mmol/L (ref 22–32)
CO2: 19 mmol/L — ABNORMAL LOW (ref 22–32)
CO2: 23 mmol/L (ref 22–32)
Calcium: 8.1 mg/dL — ABNORMAL LOW (ref 8.9–10.3)
Calcium: 8.6 mg/dL — ABNORMAL LOW (ref 8.9–10.3)
Calcium: 9 mg/dL (ref 8.9–10.3)
Calcium: 8.6 mg/dL — ABNORMAL LOW (ref 8.9–10.3)
Chloride: 87 mmol/L — ABNORMAL LOW (ref 98–111)
Chloride: 94 mmol/L — ABNORMAL LOW (ref 98–111)
Chloride: 97 mmol/L — ABNORMAL LOW (ref 98–111)
Chloride: 99 mmol/L (ref 98–111)
Creatinine, Ser: 0.68 mg/dL (ref 0.44–1.00)
Creatinine, Ser: 0.69 mg/dL (ref 0.44–1.00)
Creatinine, Ser: 0.54 mg/dL (ref 0.44–1.00)
Creatinine, Ser: 1.07 mg/dL — ABNORMAL HIGH (ref 0.44–1.00)
GFR, Estimated: >60 mL/min (ref >60)
GFR, Estimated: >60 mL/min (ref >60)
GFR, Estimated: >60 mL/min (ref >60)
GFR, Estimated: 53 mL/min — ABNORMAL LOW (ref >60)
Glucose, Bld: 110 mg/dL — ABNORMAL HIGH (ref 70–99)
Glucose, Bld: 112 mg/dL — ABNORMAL HIGH (ref 70–99)
Glucose, Bld: 105 mg/dL — ABNORMAL HIGH (ref 70–99)
Glucose, Bld: 96 mg/dL (ref 70–99)
Potassium: 3.6 mmol/L (ref 3.5–5.1)
Potassium: 4.1 mmol/L (ref 3.5–5.1)
Potassium: 3.9 mmol/L (ref 3.5–5.1)
Potassium: 3.8 mmol/L (ref 3.5–5.1)
Sodium: 116 mmol/L — CL (ref 135–145)
Sodium: 122 mmol/L — ABNORMAL LOW (ref 135–145)
Sodium: 125 mmol/L — ABNORMAL LOW (ref 135–145)
Sodium: 127 mmol/L — ABNORMAL LOW (ref 135–145)

## 2022-05-17 LAB — URINALYSIS, ROUTINE W REFLEX MICROSCOPIC
Bilirubin Urine: NEGATIVE
Glucose, UA: NEGATIVE mg/dL
Hgb urine dipstick: NEGATIVE
Ketones, ur: NEGATIVE mg/dL
Nitrite: NEGATIVE
Protein, ur: NEGATIVE mg/dL
Specific Gravity, Urine: 1.02 (ref 1.005–1.030)
pH: 8.5 — ABNORMAL HIGH (ref 5.0–8.0)

## 2022-05-17 LAB — MRSA NEXT GEN BY PCR, NASAL: MRSA by PCR Next Gen: NOT DETECTED

## 2022-05-17 LAB — COMPREHENSIVE METABOLIC PANEL
ALT: 27 U/L (ref 0–44)
AST: 43 U/L — ABNORMAL HIGH (ref 15–41)
Albumin: 3.5 g/dL (ref 3.5–5.0)
Alkaline Phosphatase: 82 U/L (ref 38–126)
Anion gap: 10 (ref 5–15)
BUN: 11 mg/dL (ref 8–23)
CO2: 21 mmol/L — ABNORMAL LOW (ref 22–32)
Calcium: 8.3 mg/dL — ABNORMAL LOW (ref 8.9–10.3)
Chloride: 82 mmol/L — ABNORMAL LOW (ref 98–111)
Creatinine, Ser: 0.66 mg/dL (ref 0.44–1.00)
GFR, Estimated: >60 mL/min (ref >60)
Glucose, Bld: 138 mg/dL — ABNORMAL HIGH (ref 70–99)
Potassium: 2.4 mmol/L — CL (ref 3.5–5.1)
Sodium: 113 mmol/L — CL (ref 135–145)
Total Bilirubin: 0.6 mg/dL (ref 0.3–1.2)
Total Protein: 6.4 g/dL — ABNORMAL LOW (ref 6.5–8.1)

## 2022-05-17 LAB — CBC
HCT: 30.3 % — ABNORMAL LOW (ref 36.0–46.0)
Hemoglobin: 11.2 g/dL — ABNORMAL LOW (ref 12.0–15.0)
MCH: 33.5 pg (ref 26.0–34.0)
MCHC: 37 g/dL — ABNORMAL HIGH (ref 30.0–36.0)
MCV: 90.7 fL (ref 80.0–100.0)
Platelets: 316 10*3/uL (ref 150–400)
RBC: 3.34 MIL/uL — ABNORMAL LOW (ref 3.87–5.11)
RDW: 13.9 % (ref 11.5–15.5)
WBC: 10.3 10*3/uL (ref 4.0–10.5)
nRBC: 0 % (ref 0.0–0.2)

## 2022-05-17 LAB — MAGNESIUM: Magnesium: 1.8 mg/dL (ref 1.7–2.4)

## 2022-05-17 LAB — URINALYSIS, MICROSCOPIC (REFLEX)

## 2022-05-17 LAB — LIPASE, BLOOD: Lipase: 33 U/L (ref 11–51)

## 2022-05-17 MED ORDER — PANTOPRAZOLE SODIUM 40 MG PO TBEC
40.0000 mg | DELAYED_RELEASE_TABLET | Freq: Every day | ORAL | Status: DC
  Administered 2022-05-17 – 2022-05-18 (×2): 40 mg via ORAL
  Filled 2022-05-17 (×2): qty 1

## 2022-05-17 MED ORDER — SODIUM CHLORIDE 0.9 % IV SOLN: INTRAVENOUS | Status: DC | PRN

## 2022-05-17 MED ORDER — SENNOSIDES-DOCUSATE SODIUM 8.6-50 MG PO TABS
1.0000 | ORAL_TABLET | Freq: Every day | ORAL | Status: DC | PRN
  Administered 2022-05-17 – 2022-05-18 (×2): 1 via ORAL
  Filled 2022-05-17 (×2): qty 1

## 2022-05-17 MED ORDER — LEVOTHYROXINE SODIUM 50 MCG PO TABS
50.0000 ug | ORAL_TABLET | Freq: Every day | ORAL | Status: DC
  Administered 2022-05-18: 50 ug via ORAL
  Filled 2022-05-17: qty 1

## 2022-05-17 MED ORDER — ORAL CARE MOUTH RINSE: 15.0000 mL | OROMUCOSAL | Status: DC | PRN

## 2022-05-17 MED ORDER — ALBUTEROL SULFATE (2.5 MG/3ML) 0.083% IN NEBU: 2.5000 mg | INHALATION_SOLUTION | RESPIRATORY_TRACT | Status: DC | PRN

## 2022-05-17 MED ORDER — ONDANSETRON HCL 4 MG/2ML IJ SOLN: 4.0000 mg | Freq: Four times a day (QID) | INTRAMUSCULAR | Status: DC | PRN

## 2022-05-17 MED ORDER — ASPIRIN 81 MG PO TBEC
81.0000 mg | DELAYED_RELEASE_TABLET | Freq: Every day | ORAL | Status: DC
  Administered 2022-05-18: 81 mg via ORAL
  Filled 2022-05-17: qty 1

## 2022-05-17 MED ORDER — SODIUM CHLORIDE 0.9 % IV SOLN: INTRAVENOUS | Status: DC

## 2022-05-17 MED ORDER — ONDANSETRON 4 MG PO TBDP
4.0000 mg | ORAL_TABLET | Freq: Once | ORAL | Status: AC | PRN
  Administered 2022-05-17: 4 mg via ORAL
  Filled 2022-05-17: qty 1

## 2022-05-17 MED ORDER — ENOXAPARIN SODIUM 40 MG/0.4ML IJ SOSY
40.0000 mg | PREFILLED_SYRINGE | INTRAMUSCULAR | Status: DC
Start: 2022-05-17 — End: 2022-05-18
  Administered 2022-05-17: 40 mg via SUBCUTANEOUS
  Filled 2022-05-17: qty 0.4

## 2022-05-17 MED ORDER — IOHEXOL 300 MG/ML  SOLN
100.0000 mL | Freq: Once | INTRAMUSCULAR | Status: AC | PRN
  Administered 2022-05-17: 100 mL via INTRAVENOUS

## 2022-05-17 MED ORDER — CHLORHEXIDINE GLUCONATE CLOTH 2 % EX PADS
6.0000 | MEDICATED_PAD | Freq: Every day | CUTANEOUS | Status: DC
  Administered 2022-05-17 – 2022-05-18 (×2): 6 via TOPICAL

## 2022-05-17 MED ORDER — POTASSIUM CHLORIDE 10 MEQ/100ML IV SOLN
10.0000 meq | INTRAVENOUS | Status: DC
  Administered 2022-05-17 (×5): 10 meq via INTRAVENOUS
  Filled 2022-05-17 (×6): qty 100

## 2022-05-17 MED ORDER — ACETAMINOPHEN 650 MG RE SUPP: 650.0000 mg | Freq: Four times a day (QID) | RECTAL | Status: DC | PRN

## 2022-05-17 MED ORDER — ACETAMINOPHEN 325 MG PO TABS: 650.0000 mg | ORAL_TABLET | Freq: Four times a day (QID) | ORAL | Status: DC | PRN

## 2022-05-17 MED ORDER — TOPIRAMATE 100 MG PO TABS
100.0000 mg | ORAL_TABLET | Freq: Two times a day (BID) | ORAL | Status: DC
  Administered 2022-05-17 – 2022-05-18 (×2): 100 mg via ORAL
  Filled 2022-05-17 (×2): qty 1

## 2022-05-17 MED ORDER — POTASSIUM CHLORIDE CRYS ER 20 MEQ PO TBCR
40.0000 meq | EXTENDED_RELEASE_TABLET | Freq: Once | ORAL | Status: AC
  Administered 2022-05-17: 40 meq via ORAL
  Filled 2022-05-17: qty 2

## 2022-05-17 MED ORDER — PRAVASTATIN SODIUM 20 MG PO TABS
40.0000 mg | ORAL_TABLET | Freq: Every day | ORAL | Status: DC
  Administered 2022-05-17: 40 mg via ORAL
  Filled 2022-05-17: qty 2

## 2022-05-17 MED ORDER — ONDANSETRON HCL 4 MG PO TABS: 4.0000 mg | ORAL_TABLET | Freq: Four times a day (QID) | ORAL | Status: DC | PRN

## 2022-05-17 MED ORDER — METOPROLOL TARTRATE 25 MG PO TABS
50.0000 mg | ORAL_TABLET | Freq: Two times a day (BID) | ORAL | Status: DC
  Administered 2022-05-17 – 2022-05-18 (×2): 50 mg via ORAL
  Filled 2022-05-17 (×2): qty 2

## 2022-05-17 NOTE — ED Provider Notes (Signed)
[facility]  Provider Note  CSN: [id number] Arrival date & time: 05/16/22 2341  History Chief Complaint  Patient presents with   Diarrhea    [name] is a 76 y.o. female had several days of frequent watery diarrhea last week, eventually stopped but has continued to have occasional loose stools after eating. Now also having nausea, dizziness, headache and tingling in legs. She has been drinking approx 2 gallons of water a day. No fevers but was having hot flashes.    Home Medications Prior to Admission medications   Medication Sig Start Date End Date Taking? Authorizing Provider  albuterol (VENTOLIN HFA) 108 (90 Base) MCG/ACT inhaler TAKE 2 PUFFS BY MOUTH EVERY 6 HOURS AS NEEDED FOR WHEEZE OR SHORTNESS OF BREATH Patient not taking: Reported on 02/11/2022 12/16/20   Biagio Borg, MD  Ascorbic Acid (VITAMIN C PO) Take by mouth daily.    [provider]  aspirin 81 MG tablet Take 81 mg by mouth daily.    [provider]  Cholecalciferol (VITAMIN D3) 50 MCG (2000 UT) TABS Take 1 capsule by mouth daily.    [provider]  Cyanocobalamin (B-12 PO) Take by mouth daily.    [provider]  folic acid (FOLVITE) 1 MG tablet Take 2 mg by mouth daily.     [provider]  hydrochlorothiazide (HYDRODIURIL) 25 MG tablet TAKE 1 TABLET (25 MG TOTAL) BY MOUTH DAILY. 12/03/21 03/03/22  Biagio Borg, MD  ibuprofen (ADVIL) 600 MG tablet Take 1 tablet (600 mg total) by mouth every 6 (six) hours as needed. 11/18/21   [name], MD  levothyroxine (SYNTHROID) 50 MCG tablet TAKE 1 TABLET BY MOUTH EVERY DAY 12/03/21   Biagio Borg, MD  lisinopril-hydrochlorothiazide (ZESTORETIC) 20-25 MG tablet Take 1 tablet by mouth daily.    [provider]  methotrexate (RHEUMATREX) 2.5 MG tablet Take 17.5 mg by mouth once a week. Wednesday's 04/13/14   [provider]  metoprolol tartrate (LOPRESSOR) 50 MG tablet  TAKE 1 TABLET BY MOUTH TWICE A DAY Patient taking differently: Take 50 mg by mouth 2 (two) times daily. 10/15/21   Biagio Borg, MD  Multiple Vitamins-Minerals (CENTRUM SILVER PO) Take 1 tablet by mouth daily.    [provider]  pantoprazole (PROTONIX) 40 MG tablet TAKE 1 TABLET BY MOUTH EVERY DAY 12/03/21   Biagio Borg, MD  polyethylene glycol powder (GLYCOLAX/MIRALAX) 17 GM/SCOOP powder Take 17 g by mouth daily. Drink 17g (1 scoop) dissolved in water per day. Patient not taking: Reported on 02/11/2022 11/18/21   [name], MD  pravastatin (PRAVACHOL) 40 MG tablet TAKE 1 TABLET BY MOUTH EVERY DAY Patient taking differently: Take 40 mg by mouth at bedtime. 10/15/21   Biagio Borg, MD  topiramate (TOPAMAX) 100 MG tablet TAKE 1 TABLET BY MOUTH TWICE A DAY 12/03/21   Biagio Borg, MD     Allergies    Allegra [fexofenadine], Atenolol, Ciprofloxacin, Codeine, Cortisone, Doxycycline, Fosamax [alendronate], Klonopin [clonazepam], Lisinopril, Prednisone, Ramipril, Sulfa antibiotics, Sulfamethoxazole-trimethoprim, Tizanidine, Verapamil, and Chocolate   Review of Systems   Review of Systems Please see HPI for pertinent positives and negatives  Physical Exam BP (!) 152/65 (BP Location: Right Arm)   Pulse (!) 58   Temp 98.2 F (36.8 C) (Oral)   Resp 15   SpO2 99%   Physical Exam Vitals and nursing note reviewed.  Constitutional:      Appearance: Normal appearance.  HENT:     Head: Normocephalic and atraumatic.     Nose: Nose normal.     Mouth/Throat:     Mouth: Mucous membranes are moist.  Eyes:     Extraocular Movements: Extraocular movements intact.     Conjunctiva/sclera: Conjunctivae normal.  Cardiovascular:     Rate and Rhythm: Normal rate.  Pulmonary:     Effort: Pulmonary effort is normal.     Breath sounds: Normal breath sounds.  Abdominal:     General: Abdomen is flat.     Palpations: Abdomen is soft.     Tenderness: There is abdominal tenderness  (diffuse). There is no guarding.  Musculoskeletal:        General: No swelling. Normal range of motion.     Cervical back: Neck supple.  Skin:    General: Skin is warm and dry.  Neurological:     General: No focal deficit present.     Mental Status: She is alert.  Psychiatric:        Mood and Affect: Mood normal.     ED Results / Procedures / Treatments   EKG EKG Interpretation  Date/Time:  Tuesday May 17 2022 01:04:25 EDT Ventricular Rate:  62 PR Interval:  78 QRS Duration: 109 QT Interval:  488 QTC Calculation: 496 R Axis:   15 Text Interpretation: Sinus rhythm Short PR interval Nonspecific repol abnormality, diffuse leads Borderline prolonged QT interval Since last tracing QT has lengthened Confirmed by Calvert Cantor [phone]) on 05/17/2022 1:08:16 AM  Procedures Procedures  Medications Ordered in the ED Medications  0.9 %  sodium chloride infusion ( Intravenous New Bag/Given 05/17/22 0117)  potassium chloride 10 mEq in 100 mL IVPB (10 mEq Intravenous New Bag/Given 05/17/22 0257)  0.9 %  sodium chloride infusion ( Intravenous New Bag/Given 05/17/22 0154)  ondansetron (ZOFRAN-ODT) disintegrating tablet 4 mg (4 mg Oral Given 05/17/22 0017)  potassium chloride SA (KLOR-CON M) CR tablet 40 mEq (40 mEq Oral Given 05/17/22 0158)  iohexol (OMNIPAQUE) 300 MG/ML solution 100 mL (100 mLs Intravenous Contrast Given 05/17/22 0126)    Initial Impression and Plan  Patient here with recent diarrheal illness, now with dizziness, nausea and tingling. She had labs done in triage showing marked hyponatremia and hypokalemia. CBC with mild anemia at baseline. Will check urine, begin Na and K repletion. Send for CT due to continued abdominal discomfort/tenderness.   ED Course   Clinical Course as of 05/17/22 0301  Tue May 17, 2022  0147 UA without infection [CS]  0158 I personally viewed the images from radiology studies and agree with radiologist interpretation: CT neg for acute process. There is  increased stool burden. Hospitalist paged for admission.   [CS]  0217 Spoke with Dr. Myna Hidalgo, Hospitalist, who will accept for admission.  [CS]    Clinical Course User Index [CS] Truddie Hidden, MD     MDM Rules/Calculators/A&P Medical Decision Making Problems Addressed: Diarrhea, unspecified type: acute illness or injury Hypokalemia: acute illness or injury Hyponatremia: acute illness or injury  Amount and/or Complexity of Data Reviewed Labs: ordered. Decision-making details documented in ED Course. Radiology: ordered and independent interpretation performed. Decision-making details documented in ED Course. ECG/medicine tests: ordered and independent interpretation performed. Decision-making details documented in ED Course.  Risk Prescription drug management. Decision regarding hospitalization.  Critical Care Total time providing critical care: 40 minutes    Final Clinical Impression(s) / ED Diagnoses Final diagnoses:  Hyponatremia  Hypokalemia  Diarrhea, unspecified type    Rx /  DC Orders ED Discharge Orders     None        Truddie Hidden, MD 05/17/22 [phone]

## 2022-05-17 NOTE — ED Notes (Signed)
Pt walked to and from the bathroom, 1 x ast. Gave pt pericare.

## 2022-05-17 NOTE — ED Notes (Signed)
Rn approved crackers and ginger ale 218m. Pillow support under knees to take pressure off of bottom.
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
## 2022-05-17 NOTE — ED Notes (Signed)
Patient transported to CT 

## 2022-05-18 DIAGNOSIS — E871 Hypo-osmolality and hyponatremia: Secondary | ICD-10-CM | POA: Diagnosis not present

## 2022-05-18 LAB — HEPATIC FUNCTION PANEL
ALT: 23 U/L (ref 0–44)
AST: 32 U/L (ref 15–41)
Albumin: 3.1 g/dL — ABNORMAL LOW (ref 3.5–5.0)
Alkaline Phosphatase: 64 U/L (ref 38–126)
Bilirubin, Direct: 0.1 mg/dL (ref 0.0–0.2)
Indirect Bilirubin: 0.3 mg/dL (ref 0.3–0.9)
Total Bilirubin: 0.4 mg/dL (ref 0.3–1.2)
Total Protein: 5.8 g/dL — ABNORMAL LOW (ref 6.5–8.1)

## 2022-05-18 LAB — BASIC METABOLIC PANEL
Anion gap: 7 (ref 5–15)
Anion gap: 4 — ABNORMAL LOW (ref 5–15)
Anion gap: 6 (ref 5–15)
BUN: 10 mg/dL (ref 8–23)
BUN: 10 mg/dL (ref 8–23)
BUN: 10 mg/dL (ref 8–23)
CO2: 20 mmol/L — ABNORMAL LOW (ref 22–32)
CO2: 20 mmol/L — ABNORMAL LOW (ref 22–32)
CO2: 20 mmol/L — ABNORMAL LOW (ref 22–32)
Calcium: 8.4 mg/dL — ABNORMAL LOW (ref 8.9–10.3)
Calcium: 8.5 mg/dL — ABNORMAL LOW (ref 8.9–10.3)
Calcium: 8.6 mg/dL — ABNORMAL LOW (ref 8.9–10.3)
Chloride: 101 mmol/L (ref 98–111)
Chloride: 106 mmol/L (ref 98–111)
Chloride: 104 mmol/L (ref 98–111)
Creatinine, Ser: 0.71 mg/dL (ref 0.44–1.00)
Creatinine, Ser: 0.65 mg/dL (ref 0.44–1.00)
Creatinine, Ser: 0.63 mg/dL (ref 0.44–1.00)
GFR, Estimated: >60 mL/min (ref >60)
GFR, Estimated: >60 mL/min (ref >60)
GFR, Estimated: >60 mL/min (ref >60)
Glucose, Bld: 94 mg/dL (ref 70–99)
Glucose, Bld: 93 mg/dL (ref 70–99)
Glucose, Bld: 112 mg/dL — ABNORMAL HIGH (ref 70–99)
Potassium: 3.3 mmol/L — ABNORMAL LOW (ref 3.5–5.1)
Potassium: 3.4 mmol/L — ABNORMAL LOW (ref 3.5–5.1)
Potassium: 3.6 mmol/L (ref 3.5–5.1)
Sodium: 128 mmol/L — ABNORMAL LOW (ref 135–145)
Sodium: 130 mmol/L — ABNORMAL LOW (ref 135–145)
Sodium: 130 mmol/L — ABNORMAL LOW (ref 135–145)

## 2022-05-18 LAB — CBC
HCT: 28.9 % — ABNORMAL LOW (ref 36.0–46.0)
Hemoglobin: 10.3 g/dL — ABNORMAL LOW (ref 12.0–15.0)
MCH: 33.8 pg (ref 26.0–34.0)
MCHC: 35.6 g/dL (ref 30.0–36.0)
MCV: 94.8 fL (ref 80.0–100.0)
Platelets: 243 10*3/uL (ref 150–400)
RBC: 3.05 MIL/uL — ABNORMAL LOW (ref 3.87–5.11)
RDW: 14.2 % (ref 11.5–15.5)
WBC: 6.7 10*3/uL (ref 4.0–10.5)
nRBC: 0 % (ref 0.0–0.2)

## 2022-05-18 LAB — MAGNESIUM: Magnesium: 2 mg/dL (ref 1.7–2.4)

## 2022-05-18 MED ORDER — POTASSIUM CHLORIDE CRYS ER 20 MEQ PO TBCR
40.0000 meq | EXTENDED_RELEASE_TABLET | Freq: Once | ORAL | Status: AC
  Administered 2022-05-18: 40 meq via ORAL
  Filled 2022-05-18: qty 2

## 2022-05-18 NOTE — TOC Initial Note (Signed)
Transition of Care [facility]) - Initial/Assessment Note    Patient Details  Name: [name] MRN: [id number] Date of Birth: June 23, 1946  [facility] CM/SW Contact:    Dessa Phi, RN Phone Number: 05/18/2022, 10:43 AM  Clinical Narrative: Await PT eval.                  Expected Discharge Plan: Home/Self Care Barriers to Discharge: Continued Medical Work up   Patient Goals and CMS Choice Patient states their goals for this hospitalization and ongoing recovery are:: Home CMS Medicare.gov Compare Post Acute Care list provided to:: Patient    Expected Discharge Plan and Services Expected Discharge Plan: Home/Self Care   Discharge Planning Services: CM Consult   Living arrangements for the past 2 months: Single Family Home                                      Prior Living Arrangements/Services Living arrangements for the past 2 months: Single Family Home Lives with:: Adult Children Patient language and need for interpreter reviewed:: Yes Do you feel safe going back to the place where you live?: Yes      Need for Family Participation in Patient Care: Yes (Comment) Care giver support system in place?: Yes (comment)   Criminal Activity/Legal Involvement Pertinent to Current Situation/Hospitalization: No - Comment as needed  Activities of Daily Living Home Assistive Devices/Equipment: Cane (specify quad or straight) ADL Screening (condition at time of admission) Patient's cognitive ability adequate to safely complete daily activities?: Yes Is the patient deaf or have difficulty hearing?: No Does the patient have difficulty seeing, even when wearing glasses/contacts?: No Does the patient have difficulty concentrating, remembering, or making decisions?: No Patient able to express need for assistance with ADLs?: Yes Does the patient have difficulty dressing or bathing?: Yes Independently performs ADLs?: Yes (appropriate for developmental age) Does the  patient have difficulty walking or climbing stairs?: No Weakness of Legs: None Weakness of Arms/Hands: None  Permission Sought/Granted Permission sought to share information with : Case Manager Permission granted to share information with : Yes, Verbal Permission Granted  Share Information with NAME: Case manager           Emotional Assessment Appearance:: Appears stated age Attitude/Demeanor/Rapport: Gracious Affect (typically observed): Accepting Orientation: : Oriented to Self, Oriented to Place, Oriented to  Time, Oriented to Situation Alcohol / Substance Use: Not Applicable Psych Involvement: No (comment)  Admission diagnosis:  Hypokalemia [E87.6] Hyponatremia [E87.1] Diarrhea, unspecified type [R19.7] Patient Active Problem List   Diagnosis Date Noted   Hyponatremia 05/17/2022   Dehydration 05/17/2022   Hypochloremia 05/17/2022   B12 deficiency 06/27/2021   Vitamin D deficiency 06/27/2021   Tremor 12/23/2020   Gait disorder 12/23/2020   Drop attack 06/17/2020   Iron deficiency 12/12/2019   Right low back pain 06/13/2018   Urinary urgency 06/13/2018   Hypothyroidism 02/20/2018   Urinary frequency 09/21/2017   Cold sore 09/21/2017   Abnormal urine odor 03/24/2017   Rosacea 01/18/2017   Dysuria 12/20/2016   Greater trochanteric bursitis of right hip 11/21/2016   Rheumatoid arthritis flare ([place]) 11/08/2016   Right hip pain 09/30/2016   Skin lesion 05/04/2016   Cough 02/03/2016   Wheezing 02/03/2016   RA (rheumatoid arthritis) ([place]) 09/16/2015   Lower back pain 03/31/2015   Arthritis of right shoulder region 12/15/2014   Anemia 09/26/2014   Right shoulder pain  09/26/2014   Night sweats 06/28/2014   Depression with anxiety 05/16/2014   Chest pain 03/28/2014   RLS (restless legs syndrome) 03/28/2014   Bilateral hand pain 12/26/2013   Impaired glucose tolerance 10/04/2013   Palpitations 07/17/2013   Cervical dystonia 06/20/2013   Headache(784.0) 06/20/2013    Left arm pain 04/29/2013   Left arm swelling 04/29/2013   Osteopenia 04/04/2013   Tachy-brady syndrome ([place]) 04/04/2012   Encounter for well adult exam with abnormal findings 03/10/2012   Persistent headaches    DVT (deep venous thrombosis) ([facility])    Paresthesias    Junctional bradycardia    Hyperlipidemia    HTN (hypertension)    Atrial fibrillation ([place])    Recurrent UTI    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

## 2022-05-18 NOTE — Evaluation (Signed)
Physical Therapy Evaluation Patient Details Name: [name] MRN: [id number] DOB: 01-06-1946 Today's Date: 05/18/2022  History of Present Illness  [name] is a 76 y.o. female with medical history significant of hypertension, hypothyroidism, hyperlipidemia, rheumatoid arthritis on methotrexate weekly presented 05/17/22 with diarrhea, nausea with headaches, dizziness and tingling in her legs, sodium 113, potassium 2.4.  Clinical Impression  The patient is eager to ambulate today. The patient  reports that she has tremors and has had fallen in the past, uses a "hurry cane". The patient lives alone , has family support.  Pt admitted with above diagnosis.  Pt currently with functional limitations due to the deficits listed below (see PT Problem List). Pt will benefit from skilled PT to increase their independence and safety with mobility to allow discharge to the venue listed below.          Recommendations for follow up therapy are one component of a multi-disciplinary discharge planning process, led by the attending physician.  Recommendations may be updated based on patient status, additional functional criteria and insurance authorization.  Follow Up Recommendations No PT follow up      Assistance Recommended at Discharge PRN  Patient can return home with the following  Help with stairs or ramp for entrance;Assistance with cooking/housework;Assist for transportation    Equipment Recommendations None recommended by PT  Recommendations for Other Services       Functional Status Assessment Patient has had a recent decline in their functional status and demonstrates the ability to make significant improvements in function in a reasonable and predictable amount of time.     Precautions / Restrictions Precautions Precautions: Fall      Mobility  Bed Mobility Overal bed mobility: Modified Independent                  Transfers Overall transfer level: Needs  assistance Equipment used: Straight cane Transfers: Sit to/from Stand Sit to Stand: Min guard                Ambulation/Gait Ambulation/Gait assistance: Min guard Gait Distance (Feet): 220 Feet Assistive device: Straight cane Gait Pattern/deviations: Step-through pattern, Drifts right/left Gait velocity: decr     General Gait Details: gait mildly unsteady initially, improved with distance  Stairs            Wheelchair Mobility    Modified Rankin (Stroke Patients Only)       Balance Overall balance assessment: Mild deficits observed, not formally tested                                           Pertinent Vitals/Pain Pain Assessment Pain Assessment: No/denies pain    Home Living Family/patient expects to be discharged to:: Private residence Living Arrangements: Alone Available Help at Discharge: Family;Available PRN/intermittently Type of Home: House Home Access: Level entry (threshold)       Home Layout: One level Home Equipment: Cane - single point      Prior Function Prior Level of Function : Independent/Modified Independent             Mobility Comments: uses "hurricane" especially for community mobility, sometimes furniture surfs at home ADLs Comments: IND     Hand Dominance        Extremity/Trunk Assessment   Upper Extremity Assessment Upper Extremity Assessment: Overall WFL for tasks assessed    Lower Extremity Assessment Lower  Extremity Assessment: Generalized weakness    Cervical / Trunk Assessment Cervical / Trunk Assessment: Normal  Communication      Cognition Arousal/Alertness: Awake/alert Behavior During Therapy: WFL for tasks assessed/performed Overall Cognitive Status: Within Functional Limits for tasks assessed                                          General Comments      Exercises     Assessment/Plan    PT Assessment Patient needs continued PT services  PT Problem  List Decreased strength;Decreased balance;Decreased activity tolerance;Decreased knowledge of use of DME       PT Treatment Interventions DME instruction;Functional mobility training;Gait training;Therapeutic activities;Therapeutic exercise;Patient/family education    PT Goals (Current goals can be found in the Care Plan section)  Acute Rehab PT Goals Patient Stated Goal: to go home PT Goal Formulation: With patient Time For Goal Achievement: 06/01/22 Potential to Achieve Goals: Good    Frequency Min 3X/week     Co-evaluation               AM-PAC PT "6 Clicks" Mobility  Outcome Measure Help needed turning from your back to your side while in a flat bed without using bedrails?: None Help needed moving from lying on your back to sitting on the side of a flat bed without using bedrails?: None Help needed moving to and from a bed to a chair (including a wheelchair)?: A Little Help needed standing up from a chair using your arms (e.g., wheelchair or bedside chair)?: A Little Help needed to walk in hospital room?: A Little Help needed climbing 3-5 steps with a railing? : A Lot 6 Click Score: 19    End of Session Equipment Utilized During Treatment: Gait belt Activity Tolerance: Patient tolerated treatment well Patient left: in chair;with call bell/phone within reach;with chair alarm set Nurse Communication: Mobility status PT Visit Diagnosis: Unsteadiness on feet (R26.81);Difficulty in walking, not elsewhere classified (R26.2)    Time: 4628-6381 PT Time Calculation (min) (ACUTE ONLY): 22 min   Charges:   PT Evaluation $PT Eval Low Complexity: 1 Low          [place] Office [phone] Weekend [phone]   Claretha Cooper 05/18/2022, 12:52 PM

## 2022-05-18 NOTE — Care Management Obs Status (Signed)
[place] NOTIFICATION   Patient Details  Name: [name] MRN: [id number] Date of Birth: July 11, 1946   Medicare Observation Status Notification Given:  Yes    MahabirJuliann Pulse, RN 05/18/2022, 1:15 PM

## 2022-05-18 NOTE — Care Management CC44 (Signed)
Condition Code 44 Documentation Completed  Patient Details  Name: [name] MRN: [id number] Date of Birth: 13-Jul-1946   Condition Code 44 given:  Yes Patient signature on Condition Code 44 notice:  Yes Documentation of 2 MD's agreement:  Yes Code 44 added to claim:  Yes    Dessa Phi, RN 05/18/2022, 1:15 PM

## 2022-05-18 NOTE — Discharge Summary (Signed)
Physician Discharge Summary   [name] WPY:[id number] DOB: [birth year] DOA: 05/17/2022  PCP: Biagio Borg, MD  Admit date: 05/17/2022 Discharge date: 05/18/2022  Admitted From: Home Disposition:  Home Discharging physician: Dwyane Dee, MD  Recommendations for Outpatient Follow-up:  Continue routine outpt care  Home Health:  Equipment/Devices:   Discharge Condition: stable CODE STATUS: Full Diet recommendation:  Diet Orders (From admission, onward)     Start     Ordered   05/18/22 0000  Diet general        05/18/22 1240   05/17/22 1437  Diet regular Room service appropriate? Yes; Fluid consistency: Thin  Diet effective now       Question Answer Comment  Room service appropriate? Yes   Fluid consistency: Thin      05/17/22 1438            Hospital Course:  [name] is a 76 y.o. female with medical history significant of hypertension, hypothyroidism, hyperlipidemia, rheumatoid arthritis on methotrexate weekly who presented with several days of frequent watery diarrhea getting worse over the last week. Her diarrhea had resolved just prior to admission and therefore no stool testing was able to be obtained. Sodium level on admission was 113 which was presumed due to GI losses.  She was started on IV fluids on admission and sodium levels were trended. She was able to tolerate a diet and sodium levels stabilized. Sodium had improved to 130 by time of discharge and she remained asymptomatic. She was considered stable for discharging home.  The patient's chronic medical conditions were treated accordingly per the patient's home medication regimen except as noted.  On day of discharge, patient was felt deemed stable for discharge. Patient/family member advised to call PCP or come back to ER if needed.   Principal Diagnosis: Hyponatremia  Discharge Diagnoses: Active Hospital Problems   Diagnosis Date Noted   Hyponatremia 05/17/2022   Dehydration  05/17/2022   Hypochloremia 05/17/2022   Hypothyroidism 02/20/2018   Hyperlipidemia    HTN (hypertension)     Resolved Hospital Problems  No resolved problems to display.     Discharge Instructions     Diet general   Complete by: As directed    Increase activity slowly   Complete by: As directed       Allergies as of 05/18/2022       Reactions   Allegra [fexofenadine] Swelling   Lip/ eyes swelling   Atenolol Other (See Comments)   Increased BP   Ciprofloxacin Swelling   Tongue and lip swelling   Codeine Nausea And Vomiting, Swelling, Other (See Comments)   Swelling of eyes   Cortisone Swelling   Glucocorticoids specifically: (injection) causes swelling of face   Doxycycline Other (See Comments)   Per pt: unknown   Fosamax [alendronate] Other (See Comments)   Gi upset   Klonopin [clonazepam] Other (See Comments)   Urinary retention   Lisinopril Other (See Comments)   05/07/14 lower lip paresthesia   Prednisone Swelling   Ramipril Other (See Comments)   Increases BP   Sulfa Antibiotics Swelling   Sulfamethoxazole-trimethoprim Other (See Comments)    Unknown per patient   Tizanidine Other (See Comments)   Dizziness   Verapamil Other (See Comments)   Junctional bradycardia which resolved after stopping medication   Chocolate Rash        Medication List     STOP taking these medications    hydrochlorothiazide 25 MG tablet Commonly known as: HYDRODIURIL  TAKE these medications    acetaminophen 500 MG tablet Commonly known as: TYLENOL Take 1,000 mg by mouth daily as needed (pain).   albuterol 108 (90 Base) MCG/ACT inhaler Commonly known as: VENTOLIN HFA TAKE 2 PUFFS BY MOUTH EVERY 6 HOURS AS NEEDED FOR WHEEZE OR SHORTNESS OF BREATH   ascorbic acid 500 MG tablet Commonly known as: VITAMIN C Take 1,000 mg by mouth daily.   aspirin 81 MG tablet Take 81 mg by mouth every other day.   B-12 PO Take 1 tablet by mouth daily.   CENTRUM SILVER  PO Take 1 tablet by mouth daily.   FISH OIL PO Take 1-2 capsules by mouth daily.   folic acid 1 MG tablet Commonly known as: FOLVITE Take 2 mg by mouth daily.   ibuprofen 600 MG tablet Commonly known as: ADVIL Take 1 tablet (600 mg total) by mouth every 6 (six) hours as needed.   levothyroxine 50 MCG tablet Commonly known as: SYNTHROID TAKE 1 TABLET BY MOUTH EVERY DAY What changed: when to take this   lisinopril-hydrochlorothiazide 20-25 MG tablet Commonly known as: ZESTORETIC Take 1 tablet by mouth daily.   methotrexate 2.5 MG tablet Commonly known as: RHEUMATREX Take 17.5 mg by mouth once a week. Wednesday's   metoprolol tartrate 50 MG tablet Commonly known as: LOPRESSOR TAKE 1 TABLET BY MOUTH TWICE A DAY   pantoprazole 40 MG tablet Commonly known as: PROTONIX TAKE 1 TABLET BY MOUTH EVERY DAY   polyethylene glycol powder 17 GM/SCOOP powder Commonly known as: GLYCOLAX/MIRALAX Take 17 g by mouth daily. Drink 17g (1 scoop) dissolved in water per day. What changed:  when to take this reasons to take this additional instructions   pravastatin 40 MG tablet Commonly known as: PRAVACHOL TAKE 1 TABLET BY MOUTH EVERY DAY What changed: when to take this   topiramate 100 MG tablet Commonly known as: TOPAMAX TAKE 1 TABLET BY MOUTH TWICE A DAY   Vitamin D 125 MCG (5000 UT) Caps Take 5,000 Units by mouth daily in the afternoon. What changed: Another medication with the same name was removed. Continue taking this medication, and follow the directions you see here.        Allergies  Allergen Reactions   Allegra [Fexofenadine] Swelling    Lip/ eyes swelling   Atenolol Other (See Comments)    Increased BP   Ciprofloxacin Swelling    Tongue and lip swelling   Codeine Nausea And Vomiting, Swelling and Other (See Comments)    Swelling of eyes   Cortisone Swelling    Glucocorticoids specifically: (injection) causes swelling of face    Doxycycline Other (See  Comments)    Per pt: unknown   Fosamax [Alendronate] Other (See Comments)    Gi upset   Klonopin [Clonazepam] Other (See Comments)    Urinary retention   Lisinopril Other (See Comments)    05/07/14 lower lip paresthesia   Prednisone Swelling   Ramipril Other (See Comments)    Increases BP   Sulfa Antibiotics Swelling   Sulfamethoxazole-Trimethoprim Other (See Comments)     Unknown per patient   Tizanidine Other (See Comments)    Dizziness    Verapamil Other (See Comments)    Junctional bradycardia which resolved after stopping medication   Chocolate Rash    Consultations:   Procedures:   Discharge Exam: BP 122/67   Pulse (!) 57   Temp 98.4 F (36.9 C) (Oral)   Resp 14   Ht 5' (1.524 m)  Wt 64.3 kg   SpO2 100%   BMI 27.68 kg/m  Physical Exam Constitutional:      General: She is not in acute distress.    Appearance: Normal appearance.  HENT:     Head: Normocephalic and atraumatic.     Mouth/Throat:     Mouth: Mucous membranes are moist.  Eyes:     Extraocular Movements: Extraocular movements intact.  Cardiovascular:     Rate and Rhythm: Normal rate and regular rhythm.  Pulmonary:     Breath sounds: Normal breath sounds.  Abdominal:     General: Bowel sounds are normal. There is no distension.     Palpations: Abdomen is soft.     Tenderness: There is no abdominal tenderness.  Musculoskeletal:        General: Normal range of motion.     Cervical back: Normal range of motion and neck supple.  Skin:    General: Skin is warm and dry.  Neurological:     General: No focal deficit present.     Mental Status: She is alert.  Psychiatric:        Mood and Affect: Mood normal.        Behavior: Behavior normal.      The results of significant diagnostics from this hospitalization (including imaging, microbiology, ancillary and laboratory) are listed below for reference.   Microbiology: Recent Results (from the past 240 hour(s))  MRSA Next Gen by PCR, Nasal      Status: None   Collection Time: 05/17/22  2:18 PM   Specimen: Nasal Mucosa; Nasal Swab  Result Value Ref Range Status   MRSA by PCR Next Gen NOT DETECTED NOT DETECTED Final    Comment: (NOTE) The GeneXpert MRSA Assay (FDA approved for NASAL specimens only), is one component of a comprehensive MRSA colonization surveillance program. It is not intended to diagnose MRSA infection nor to guide or monitor treatment for MRSA infections. Test performance is not FDA approved in patients less than 43 years old. Performed at [facility], [place] [address], [place], [place] [address]      Labs: BNP (last 3 results) No results for input(s): "BNP" in the last 8760 hours. Basic Metabolic Panel: Recent Labs  Lab 05/17/22 0525 05/17/22 0545 05/17/22 1629 05/17/22 1947 05/18/22 0031 05/18/22 0445 05/18/22 1126  NA  --    < > 125* 127* 128* 130* 130*  K  --    < > 3.9 3.8 3.3* 3.4* 3.6  CL  --    < > 97* 99 101 106 104  CO2  --    < > 19* 23 20* 20* 20*  GLUCOSE  --    < > 105* 96 94 93 112*  BUN  --    < > '8 9 10 10 10  '$ CREATININE  --    < > 0.54 1.07* 0.71 0.65 0.63  CALCIUM  --    < > 9.0 8.6* 8.4* 8.5* 8.6*  MG 1.8  --   --   --  2.0  --   --    < > = values in this interval not displayed.   Liver Function Tests: Recent Labs  Lab 05/17/22 0013 05/18/22 0031  AST 43* 32  ALT 27 23  ALKPHOS 82 64  BILITOT 0.6 0.4  PROT 6.4* 5.8*  ALBUMIN 3.5 3.1*   Recent Labs  Lab 05/17/22 0013  LIPASE 33   No results for input(s): "AMMONIA" in the last 168  hours. CBC: Recent Labs  Lab 05/17/22 0013 05/18/22 0031  WBC 10.3 6.7  HGB 11.2* 10.3*  HCT 30.3* 28.9*  MCV 90.7 94.8  PLT 316 243   Cardiac Enzymes: No results for input(s): "CKTOTAL", "CKMB", "CKMBINDEX", "TROPONINI" in the last 168 hours. BNP: Invalid input(s): "POCBNP" CBG: No results for input(s): "GLUCAP" in the last 168 hours. D-Dimer No results for input(s): "DDIMER" in the last 72  hours. Hgb A1c No results for input(s): "HGBA1C" in the last 72 hours. Lipid Profile No results for input(s): "CHOL", "HDL", "LDLCALC", "TRIG", "CHOLHDL", "LDLDIRECT" in the last 72 hours. Thyroid function studies No results for input(s): "TSH", "T4TOTAL", "T3FREE", "THYROIDAB" in the last 72 hours.  Invalid input(s): "FREET3" Anemia work up No results for input(s): "VITAMINB12", "FOLATE", "FERRITIN", "TIBC", "IRON", "RETICCTPCT" in the last 72 hours. Urinalysis    Component Value Date/Time   COLORURINE YELLOW 05/17/2022 0120   APPEARANCEUR TURBID (A) 05/17/2022 0120   LABSPEC 1.020 05/17/2022 0120   PHURINE 8.5 (H) 05/17/2022 0120   GLUCOSEU NEGATIVE 05/17/2022 0120   GLUCOSEU NEGATIVE 12/22/2021 1124   HGBUR NEGATIVE 05/17/2022 0120   BILIRUBINUR NEGATIVE 05/17/2022 0120   BILIRUBINUR neg 09/21/2021 1316   KETONESUR NEGATIVE 05/17/2022 0120   PROTEINUR NEGATIVE 05/17/2022 0120   UROBILINOGEN 0.2 12/22/2021 1124   NITRITE NEGATIVE 05/17/2022 0120   LEUKOCYTESUR TRACE (A) 05/17/2022 0120   Sepsis Labs Recent Labs  Lab 05/17/22 0013 05/18/22 0031  WBC 10.3 6.7   Microbiology Recent Results (from the past 240 hour(s))  MRSA Next Gen by PCR, Nasal     Status: None   Collection Time: 05/17/22  2:18 PM   Specimen: Nasal Mucosa; Nasal Swab  Result Value Ref Range Status   MRSA by PCR Next Gen NOT DETECTED NOT DETECTED Final    Comment: (NOTE) The GeneXpert MRSA Assay (FDA approved for NASAL specimens only), is one component of a comprehensive MRSA colonization surveillance program. It is not intended to diagnose MRSA infection nor to guide or monitor treatment for MRSA infections. Test performance is not FDA approved in patients less than 68 years old. Performed at [facility], [place] [address] [place], [place] [address]     Procedures/Studies: CT Abdomen Pelvis W Contrast  Result Date: 05/17/2022 CLINICAL DATA:  Abdominal pain. EXAM: CT ABDOMEN  AND PELVIS WITH CONTRAST TECHNIQUE: Multidetector CT imaging of the abdomen and pelvis was performed using the standard protocol following bolus administration of intravenous contrast. RADIATION DOSE REDUCTION: This exam was performed according to the departmental dose-optimization program which includes automated exposure control, adjustment of the mA and/or kV according to patient size and/or use of iterative reconstruction technique. CONTRAST:  140m OMNIPAQUE IOHEXOL 300 MG/ML  SOLN COMPARISON:  None Available. FINDINGS: Lower chest: The visualized lung bases are clear. No intra-abdominal free air or free fluid. Hepatobiliary: No focal liver abnormality is seen. No gallstones, gallbladder wall thickening, or biliary dilatation. Pancreas: Unremarkable. No pancreatic ductal dilatation or surrounding inflammatory changes. Spleen: Normal in size without focal abnormality. Adrenals/Urinary Tract: The adrenal glands unremarkable. The kidneys, visualized ureters, and urinary bladder appear unremarkable. Stomach/Bowel: Small hiatal hernia. There is moderate stool throughout the colon. There is no bowel obstruction or active inflammation. The appendix is normal. Vascular/Lymphatic: Mild aortoiliac atherosclerotic disease. The IVC is unremarkable. No portal venous gas. There is no adenopathy. Reproductive: Hysterectomy.  No adnexal masses. Other: None Musculoskeletal: Osteopenia with degenerative changes of the spine. No acute osseous pathology. IMPRESSION: 1. No acute intra-abdominal or pelvic  pathology. 2. Moderate colonic stool burden. No bowel obstruction. Normal appendix. 3. Aortic Atherosclerosis (ICD10-I70.0). Electronically Signed   By: Anner Crete M.D.   On: 05/17/2022 01:49     Time coordinating discharge: Over 30 minutes    Dwyane Dee, MD  [facility] 05/18/2022, 2:52 PM

## 2022-05-20 ENCOUNTER — Telehealth: Payer: Self-pay | Admitting: Internal Medicine

## 2022-05-20 NOTE — Telephone Encounter (Signed)
Pt stated the ED took her off of Lisinopril-hydroCHLOROthiazide 20-25 MG. Pt would like to know if that is ok or should she still be taking it?    Please advise

## 2022-05-20 NOTE — Telephone Encounter (Signed)
Notified pt w/MD response.../lmb 

## 2022-05-20 NOTE — Telephone Encounter (Signed)
Ok to hold for now, but please make ROV

## 2022-06-28 ENCOUNTER — Encounter: Payer: Self-pay | Admitting: Internal Medicine

## 2022-06-28 ENCOUNTER — Ambulatory Visit (INDEPENDENT_AMBULATORY_CARE_PROVIDER_SITE_OTHER): Payer: Medicare HMO | Admitting: Internal Medicine

## 2022-06-28 VITALS — BP 120/64 | HR 68 | Temp 98.4°F | Ht 60.0 in | Wt 125.0 lb

## 2022-06-28 DIAGNOSIS — M79605 Pain in left leg: Secondary | ICD-10-CM

## 2022-06-28 DIAGNOSIS — Z23 Encounter for immunization: Secondary | ICD-10-CM | POA: Diagnosis not present

## 2022-06-28 DIAGNOSIS — E871 Hypo-osmolality and hyponatremia: Secondary | ICD-10-CM

## 2022-06-28 DIAGNOSIS — I10 Essential (primary) hypertension: Secondary | ICD-10-CM | POA: Diagnosis not present

## 2022-06-28 DIAGNOSIS — E559 Vitamin D deficiency, unspecified: Secondary | ICD-10-CM | POA: Diagnosis not present

## 2022-06-28 DIAGNOSIS — E7800 Pure hypercholesterolemia, unspecified: Secondary | ICD-10-CM

## 2022-06-28 DIAGNOSIS — R7302 Impaired glucose tolerance (oral): Secondary | ICD-10-CM

## 2022-06-28 DIAGNOSIS — E538 Deficiency of other specified B group vitamins: Secondary | ICD-10-CM | POA: Diagnosis not present

## 2022-06-28 DIAGNOSIS — E876 Hypokalemia: Secondary | ICD-10-CM | POA: Diagnosis not present

## 2022-06-28 LAB — CBC WITH DIFFERENTIAL/PLATELET
Basophils Absolute: 0.1 10*3/uL (ref 0.0–0.1)
Basophils Relative: 1.2 % (ref 0.0–3.0)
Eosinophils Absolute: 0.2 10*3/uL (ref 0.0–0.7)
Eosinophils Relative: 4.1 % (ref 0.0–5.0)
HCT: 35.2 % — ABNORMAL LOW (ref 36.0–46.0)
Hemoglobin: 12 g/dL (ref 12.0–15.0)
Lymphocytes Relative: 17.5 % (ref 12.0–46.0)
Lymphs Abs: 1.1 10*3/uL (ref 0.7–4.0)
MCHC: 34.1 g/dL (ref 30.0–36.0)
MCV: 99 fl (ref 78.0–100.0)
Monocytes Absolute: 0.5 10*3/uL (ref 0.1–1.0)
Monocytes Relative: 8.9 % (ref 3.0–12.0)
Neutro Abs: 4.1 10*3/uL (ref 1.4–7.7)
Neutrophils Relative %: 68.3 % (ref 43.0–77.0)
Platelets: 258 10*3/uL (ref 150.0–400.0)
RBC: 3.55 Mil/uL — ABNORMAL LOW (ref 3.87–5.11)
RDW: 15.6 % — ABNORMAL HIGH (ref 11.5–15.5)
WBC: 6 10*3/uL (ref 4.0–10.5)

## 2022-06-28 LAB — LIPID PANEL
Cholesterol: 106 mg/dL (ref 0–200)
HDL: 44 mg/dL (ref >39.00)
LDL Cholesterol: 45 mg/dL (ref 0–99)
NonHDL: 61.59
Total CHOL/HDL Ratio: 2
Triglycerides: 83 mg/dL (ref 0.0–149.0)
VLDL: 16.6 mg/dL (ref 0.0–40.0)

## 2022-06-28 LAB — VITAMIN B12: Vitamin B-12: 1309 pg/mL — ABNORMAL HIGH (ref 211–911)

## 2022-06-28 LAB — HEMOGLOBIN A1C: Hgb A1c MFr Bld: 5.3 % (ref 4.6–6.5)

## 2022-06-28 LAB — HEPATIC FUNCTION PANEL
ALT: 19 U/L (ref 0–35)
AST: 20 U/L (ref 0–37)
Albumin: 4 g/dL (ref 3.5–5.2)
Alkaline Phosphatase: 85 U/L (ref 39–117)
Bilirubin, Direct: 0.1 mg/dL (ref 0.0–0.3)
Total Bilirubin: 0.4 mg/dL (ref 0.2–1.2)
Total Protein: 7 g/dL (ref 6.0–8.3)

## 2022-06-28 LAB — VITAMIN D 25 HYDROXY (VIT D DEFICIENCY, FRACTURES): VITD: 84.98 ng/mL (ref 30.00–100.00)

## 2022-06-28 MED ORDER — TRIAMCINOLONE ACETONIDE 0.1 % EX CREA: 1.0000 | TOPICAL_CREAM | Freq: Two times a day (BID) | CUTANEOUS | 0 refills | Status: DC

## 2022-06-28 NOTE — Patient Instructions (Addendum)
You had the flu shot today  Please take all new medication as prescribed- the steroid cream for the spot to the left ankle  Ok to stay off the HCT as the BP looks good  Please continue all other medications as before, and refills have been done if requested.  Please have the pharmacy call with any other refills you may need.  Please continue your efforts at being more active, low cholesterol diet, and weight control.  Please keep your appointments with your specialists as you may have planned  You will be contacted regarding the referral for: Leg circulation testing  Please go to the LAB at the blood drawing area for the tests to be done  You will be contacted by phone if any changes need to be made immediately.  Otherwise, you will receive a letter about your results with an explanation, but please check with MyChart first.  Please remember to sign up for MyChart if you have not done so, as this will be important to you in the future with finding out test results, communicating by private email, and scheduling acute appointments online when needed.  Please make an Appointment to return in 6 months, or sooner if needed
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
## 2022-07-01 ENCOUNTER — Encounter: Payer: Self-pay | Admitting: Internal Medicine

## 2022-07-01 NOTE — Assessment & Plan Note (Signed)
Mild low due to HCT likely but now stopped , for f/u bmp today

## 2022-07-01 NOTE — Assessment & Plan Note (Addendum)
Lab Results  Component Value Date   VITAMINB12 1,309 (H) 06/28/2022   overcontrolled, cont oral replacement - b12 1000 mcg to mon - wed - fri

## 2022-07-01 NOTE — Assessment & Plan Note (Signed)
Last vitamin D Lab Results  Component Value Date   VD25OH 84.98 06/28/2022   Stable, cont oral replacement

## 2022-07-01 NOTE — Assessment & Plan Note (Signed)
Lab Results  Component Value Date   HGBA1C 5.3 06/28/2022   Stable, pt to continue current medical treatment  - diet, wt control, excercise

## 2022-07-01 NOTE — Assessment & Plan Note (Signed)
Lab Results  Component Value Date   LDLCALC 45 06/28/2022   Stable, pt to continue current statin pravachol 40 qd

## 2022-07-01 NOTE — Assessment & Plan Note (Signed)
BP Readings from Last 3 Encounters:  06/28/22 120/64  05/18/22 122/67  01/04/22 (!) 157/76   Stable, pt to continue medical treatment zestroetic 20-12.5 qd,  Lopressor 50 bid

## 2022-07-01 NOTE — Assessment & Plan Note (Signed)
With persistent bilateral leg pain cant r/o PAD - for ABI studies

## 2022-07-07 ENCOUNTER — Other Ambulatory Visit: Payer: Self-pay | Admitting: Internal Medicine

## 2022-07-07 DIAGNOSIS — E7800 Pure hypercholesterolemia, unspecified: Secondary | ICD-10-CM | POA: Diagnosis not present

## 2022-07-07 DIAGNOSIS — R251 Tremor, unspecified: Secondary | ICD-10-CM | POA: Diagnosis not present

## 2022-07-07 DIAGNOSIS — Z79899 Other long term (current) drug therapy: Secondary | ICD-10-CM | POA: Diagnosis not present

## 2022-07-07 DIAGNOSIS — M150 Primary generalized (osteo)arthritis: Secondary | ICD-10-CM | POA: Diagnosis not present

## 2022-07-07 DIAGNOSIS — I10 Essential (primary) hypertension: Secondary | ICD-10-CM | POA: Diagnosis not present

## 2022-07-07 DIAGNOSIS — M0609 Rheumatoid arthritis without rheumatoid factor, multiple sites: Secondary | ICD-10-CM | POA: Diagnosis not present

## 2022-07-07 NOTE — Telephone Encounter (Signed)
Please refill as per office routine med refill policy (all routine meds to be refilled for 3 mo or monthly (per pt preference) up to one year from last visit, then month to month grace period for 3 mo, then further med refills will have to be denied) ? ?

## 2022-07-12 ENCOUNTER — Encounter: Payer: Self-pay | Admitting: Internal Medicine

## 2022-07-12 ENCOUNTER — Ambulatory Visit (HOSPITAL_COMMUNITY)
Admission: RE | Admit: 2022-07-12 | Discharge: 2022-07-12 | Disposition: A | Discharge status: Home / Self Care | Payer: Medicare HMO | Source: Ambulatory Visit | Attending: Internal Medicine | Admitting: Internal Medicine

## 2022-07-12 ENCOUNTER — Other Ambulatory Visit: Payer: Self-pay | Admitting: Internal Medicine

## 2022-07-12 DIAGNOSIS — I739 Peripheral vascular disease, unspecified: Secondary | ICD-10-CM | POA: Insufficient documentation

## 2022-07-12 DIAGNOSIS — M79605 Pain in left leg: Secondary | ICD-10-CM | POA: Diagnosis not present

## 2022-07-26 ENCOUNTER — Other Ambulatory Visit: Payer: Self-pay | Admitting: Internal Medicine

## 2022-07-26 ENCOUNTER — Ambulatory Visit: Payer: Medicare HMO | Admitting: Internal Medicine

## 2022-07-26 DIAGNOSIS — E559 Vitamin D deficiency, unspecified: Secondary | ICD-10-CM

## 2022-07-27 ENCOUNTER — Other Ambulatory Visit: Payer: Self-pay | Admitting: Internal Medicine

## 2022-07-27 ENCOUNTER — Ambulatory Visit (INDEPENDENT_AMBULATORY_CARE_PROVIDER_SITE_OTHER): Payer: Medicare HMO

## 2022-07-27 ENCOUNTER — Ambulatory Visit (INDEPENDENT_AMBULATORY_CARE_PROVIDER_SITE_OTHER): Payer: Medicare HMO | Admitting: Internal Medicine

## 2022-07-27 VITALS — BP 128/72 | HR 77 | Temp 97.7°F | Ht 60.0 in | Wt 126.0 lb

## 2022-07-27 DIAGNOSIS — I10 Essential (primary) hypertension: Secondary | ICD-10-CM | POA: Diagnosis not present

## 2022-07-27 DIAGNOSIS — R079 Chest pain, unspecified: Secondary | ICD-10-CM

## 2022-07-27 DIAGNOSIS — E559 Vitamin D deficiency, unspecified: Secondary | ICD-10-CM | POA: Diagnosis not present

## 2022-07-27 DIAGNOSIS — R0781 Pleurodynia: Secondary | ICD-10-CM | POA: Diagnosis not present
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
## 2022-07-27 NOTE — Assessment & Plan Note (Signed)
Last vitamin D Lab Results  Component Value Date   VD25OH 84.98 06/28/2022   Stable, cont oral replacement

## 2022-07-27 NOTE — Patient Instructions (Signed)
Your EKG was done today  Ok to take tylenol as needed for pain  Please continue all other medications as before, and refills have been done if requested.  Please have the pharmacy call with any other refills you may need.  Please continue your efforts at being more active, low cholesterol diet, and weight control.  You are otherwise up to date with prevention measures today.  Please keep your appointments with your specialists as you may have planned  Please go to the XRAY Department in the first floor for the x-ray testing  You will be contacted by phone if any changes need to be made immediately.  Otherwise, you will receive a letter about your results with an explanation, but please check with MyChart first.  Please remember to sign up for MyChart if you have not done so, as this will be important to you in the future with finding out test results, communicating by private email, and scheduling acute appointments online when needed.

## 2022-07-27 NOTE — Assessment & Plan Note (Signed)
BP Readings from Last 3 Encounters:  07/27/22 128/72  06/28/22 120/64  05/18/22 122/67   Stable, pt to continue medical treatment zestroetic 20- 25 mg qd, lopressor 50 bid
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
## 2022-08-04 DIAGNOSIS — Z90711 Acquired absence of uterus with remaining cervical stump: Secondary | ICD-10-CM | POA: Diagnosis not present

## 2022-08-04 DIAGNOSIS — Z01419 Encounter for gynecological examination (general) (routine) without abnormal findings: Secondary | ICD-10-CM | POA: Diagnosis not present

## 2022-08-05 ENCOUNTER — Other Ambulatory Visit: Payer: Self-pay | Admitting: Internal Medicine

## 2022-08-05 NOTE — Telephone Encounter (Signed)
For thyroid med only  Please refill as per office routine med refill policy (all routine meds to be refilled for 3 mo or monthly (per pt preference) up to one year from last visit, then month to month grace period for 3 mo, then further med refills will have to be denied)

## 2022-08-29 ENCOUNTER — Other Ambulatory Visit: Payer: Self-pay | Admitting: Internal Medicine

## 2022-09-21 ENCOUNTER — Ambulatory Visit
Admission: RE | Admit: 2022-09-21 | Discharge: 2022-09-21 | Disposition: A | Discharge status: Home / Self Care | Payer: Medicare HMO | Source: Ambulatory Visit | Attending: Internal Medicine | Admitting: Internal Medicine

## 2022-09-21 DIAGNOSIS — Z1231 Encounter for screening mammogram for malignant neoplasm of breast: Secondary | ICD-10-CM | POA: Diagnosis not present

## 2022-09-21 DIAGNOSIS — E559 Vitamin D deficiency, unspecified: Secondary | ICD-10-CM

## 2022-10-05 DIAGNOSIS — M150 Primary generalized (osteo)arthritis: Secondary | ICD-10-CM | POA: Diagnosis not present

## 2022-10-05 DIAGNOSIS — E7800 Pure hypercholesterolemia, unspecified: Secondary | ICD-10-CM | POA: Diagnosis not present

## 2022-10-05 DIAGNOSIS — I10 Essential (primary) hypertension: Secondary | ICD-10-CM | POA: Diagnosis not present

## 2022-10-05 DIAGNOSIS — R251 Tremor, unspecified: Secondary | ICD-10-CM | POA: Diagnosis not present

## 2022-10-05 DIAGNOSIS — Z79899 Other long term (current) drug therapy: Secondary | ICD-10-CM | POA: Diagnosis not present

## 2022-10-05 DIAGNOSIS — M0609 Rheumatoid arthritis without rheumatoid factor, multiple sites: Secondary | ICD-10-CM | POA: Diagnosis not present

## 2022-11-22 ENCOUNTER — Encounter: Payer: Self-pay | Admitting: Internal Medicine

## 2022-11-22 ENCOUNTER — Ambulatory Visit (INDEPENDENT_AMBULATORY_CARE_PROVIDER_SITE_OTHER): Payer: Medicare HMO | Admitting: Internal Medicine

## 2022-11-22 VITALS — BP 128/76 | HR 55 | Temp 98.1°F | Ht 60.0 in | Wt 126.0 lb

## 2022-11-22 DIAGNOSIS — R300 Dysuria: Secondary | ICD-10-CM

## 2022-11-22 DIAGNOSIS — E7800 Pure hypercholesterolemia, unspecified: Secondary | ICD-10-CM | POA: Diagnosis not present

## 2022-11-22 DIAGNOSIS — R7302 Impaired glucose tolerance (oral): Secondary | ICD-10-CM | POA: Diagnosis not present

## 2022-11-22 DIAGNOSIS — Z0001 Encounter for general adult medical examination with abnormal findings: Secondary | ICD-10-CM | POA: Diagnosis not present

## 2022-11-22 DIAGNOSIS — I10 Essential (primary) hypertension: Secondary | ICD-10-CM | POA: Diagnosis not present

## 2022-11-22 DIAGNOSIS — E538 Deficiency of other specified B group vitamins: Secondary | ICD-10-CM

## 2022-11-22 DIAGNOSIS — E039 Hypothyroidism, unspecified: Secondary | ICD-10-CM

## 2022-11-22 DIAGNOSIS — E559 Vitamin D deficiency, unspecified: Secondary | ICD-10-CM

## 2022-11-22 LAB — BASIC METABOLIC PANEL
BUN: 13 mg/dL (ref 6–23)
CO2: 24 mEq/L (ref 19–32)
Calcium: 9.2 mg/dL (ref 8.4–10.5)
Chloride: 96 mEq/L (ref 96–112)
Creatinine, Ser: 0.68 mg/dL (ref 0.40–1.20)
GFR: 83.94 mL/min (ref >60.00)
Glucose, Bld: 114 mg/dL — ABNORMAL HIGH (ref 70–99)
Potassium: 4.5 mEq/L (ref 3.5–5.1)
Sodium: 129 mEq/L — ABNORMAL LOW (ref 135–145)

## 2022-11-22 LAB — URINALYSIS, ROUTINE W REFLEX MICROSCOPIC
Bilirubin Urine: NEGATIVE
Hgb urine dipstick: NEGATIVE
Ketones, ur: NEGATIVE
Nitrite: NEGATIVE
Specific Gravity, Urine: 1.01 (ref 1.000–1.030)
Total Protein, Urine: NEGATIVE
Urine Glucose: NEGATIVE
Urobilinogen, UA: 0.2 (ref 0.0–1.0)
pH: 7.5 (ref 5.0–8.0)

## 2022-11-22 LAB — CBC WITH DIFFERENTIAL/PLATELET
Basophils Absolute: 0.1 10*3/uL (ref 0.0–0.1)
Basophils Relative: 1.1 % (ref 0.0–3.0)
Eosinophils Absolute: 0.2 10*3/uL (ref 0.0–0.7)
Eosinophils Relative: 2.7 % (ref 0.0–5.0)
HCT: 33.9 % — ABNORMAL LOW (ref 36.0–46.0)
Hemoglobin: 11.9 g/dL — ABNORMAL LOW (ref 12.0–15.0)
Lymphocytes Relative: 24.7 % (ref 12.0–46.0)
Lymphs Abs: 1.5 10*3/uL (ref 0.7–4.0)
MCHC: 35.1 g/dL (ref 30.0–36.0)
MCV: 99.2 fl (ref 78.0–100.0)
Monocytes Absolute: 0.6 10*3/uL (ref 0.1–1.0)
Monocytes Relative: 9.2 % (ref 3.0–12.0)
Neutro Abs: 3.9 10*3/uL (ref 1.4–7.7)
Neutrophils Relative %: 62.3 % (ref 43.0–77.0)
Platelets: 289 10*3/uL (ref 150.0–400.0)
RBC: 3.42 Mil/uL — ABNORMAL LOW (ref 3.87–5.11)
RDW: 13.7 % (ref 11.5–15.5)
WBC: 6.2 10*3/uL (ref 4.0–10.5)

## 2022-11-22 LAB — HEPATIC FUNCTION PANEL
ALT: 16 U/L (ref 0–35)
AST: 21 U/L (ref 0–37)
Albumin: 4.4 g/dL (ref 3.5–5.2)
Alkaline Phosphatase: 98 U/L (ref 39–117)
Bilirubin, Direct: 0.1 mg/dL (ref 0.0–0.3)
Total Bilirubin: 0.4 mg/dL (ref 0.2–1.2)
Total Protein: 7 g/dL (ref 6.0–8.3)

## 2022-11-22 LAB — TSH: TSH: 2.08 u[IU]/mL (ref 0.35–5.50)

## 2022-11-22 LAB — LIPID PANEL
Cholesterol: 139 mg/dL (ref 0–200)
HDL: 56.4 mg/dL (ref >39.00)
LDL Cholesterol: 72 mg/dL (ref 0–99)
NonHDL: 83.06
Total CHOL/HDL Ratio: 2
Triglycerides: 53 mg/dL (ref 0.0–149.0)
VLDL: 10.6 mg/dL (ref 0.0–40.0)

## 2022-11-22 LAB — VITAMIN B12: Vitamin B-12: >1500 pg/mL — ABNORMAL HIGH (ref 211–911)

## 2022-11-22 LAB — VITAMIN D 25 HYDROXY (VIT D DEFICIENCY, FRACTURES): VITD: 103.41 ng/mL (ref 30.00–100.00)

## 2022-11-22 LAB — HEMOGLOBIN A1C: Hgb A1c MFr Bld: 5.1 % (ref 4.6–6.5)
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
## 2022-11-22 NOTE — Patient Instructions (Signed)

## 2022-11-24 ENCOUNTER — Telehealth: Payer: Self-pay | Admitting: Internal Medicine

## 2022-11-24 NOTE — Telephone Encounter (Signed)
Patient called and asked for her recent lab results. The only note I saw said a letter was sent, so I let her know. She would like a call back explaining her results. Best callback number is [phone].

## 2022-11-25 ENCOUNTER — Encounter: Payer: Self-pay | Admitting: Internal Medicine

## 2022-11-25 NOTE — Assessment & Plan Note (Signed)
Last vitamin D Lab Results  Component Value Date   VD25OH 103.41 ([place]) 11/22/2022   Stable, cont oral replacement

## 2022-11-25 NOTE — Assessment & Plan Note (Signed)
Lab Results  Component Value Date   VITAMINB12 >1500 (H) 11/22/2022   Stable, cont oral replacement - b12 1000 mcg qd

## 2022-11-25 NOTE — Assessment & Plan Note (Signed)

## 2022-11-25 NOTE — Assessment & Plan Note (Signed)
Mild intermittent for several days but no other red flags - for UA with labs, and tx pending results

## 2022-11-25 NOTE — Telephone Encounter (Signed)
Spoke with patient and explained results, no further questions at this time

## 2022-11-25 NOTE — Assessment & Plan Note (Signed)
Lab Results  Component Value Date   LDLCALC 72 11/22/2022   Stable, pt to continue current statin pravachol 40 mg qd

## 2022-11-25 NOTE — Assessment & Plan Note (Signed)
Lab Results  Component Value Date   TSH 2.08 11/22/2022   Stable, pt to continue levothyroxine 50 mcg qd

## 2022-11-25 NOTE — Assessment & Plan Note (Signed)
BP Readings from Last 3 Encounters:  11/22/22 128/76  07/27/22 128/72  06/28/22 120/64   Stable, pt to continue medical treatment lisinopril hct 20-25 qd, lopressor 50 bid

## 2022-11-25 NOTE — Assessment & Plan Note (Signed)
Lab Results  Component Value Date   HGBA1C 5.1 11/22/2022   Stable, pt to continue current medical treatment  - diet, wt control

## 2022-11-26 ENCOUNTER — Other Ambulatory Visit: Payer: Self-pay | Admitting: Internal Medicine

## 2022-11-26 NOTE — Telephone Encounter (Signed)
Please refill as per office routine med refill policy (all routine meds to be refilled for 3 mo or monthly (per pt preference) up to one year from last visit, then month to month grace period for 3 mo, then further med refills will have to be denied) ? ?

## 2022-12-26 ENCOUNTER — Other Ambulatory Visit: Payer: Self-pay | Admitting: Internal Medicine

## 2022-12-27 ENCOUNTER — Ambulatory Visit: Payer: Medicare HMO | Admitting: Internal Medicine

## 2022-12-29 ENCOUNTER — Encounter: Payer: Self-pay | Admitting: Gastroenterology

## 2023-01-07 ENCOUNTER — Other Ambulatory Visit: Payer: Self-pay | Admitting: Internal Medicine

## 2023-01-09 ENCOUNTER — Telehealth: Payer: Self-pay | Admitting: Gastroenterology

## 2023-01-09 NOTE — Telephone Encounter (Signed)
Inbound call from patient wanting to scheduled procedure. Please advise on scheduling.

## 2023-01-09 NOTE — Telephone Encounter (Signed)
The patient was mailed letter to schedule her recall colonoscopy. Pt can be scheduled for pre-visit and direct colonoscopy in the [place]. Please return call to patient and schedule.

## 2023-01-10 DIAGNOSIS — M150 Primary generalized (osteo)arthritis: Secondary | ICD-10-CM | POA: Diagnosis not present

## 2023-01-10 DIAGNOSIS — Z79899 Other long term (current) drug therapy: Secondary | ICD-10-CM | POA: Diagnosis not present

## 2023-01-10 DIAGNOSIS — M0609 Rheumatoid arthritis without rheumatoid factor, multiple sites: Secondary | ICD-10-CM | POA: Diagnosis not present

## 2023-01-10 DIAGNOSIS — I10 Essential (primary) hypertension: Secondary | ICD-10-CM | POA: Diagnosis not present

## 2023-01-10 DIAGNOSIS — R251 Tremor, unspecified: Secondary | ICD-10-CM | POA: Diagnosis not present

## 2023-01-10 DIAGNOSIS — E7800 Pure hypercholesterolemia, unspecified: Secondary | ICD-10-CM | POA: Diagnosis not present

## 2023-01-10 LAB — LAB REPORT - SCANNED: EGFR: 78

## 2023-01-11 ENCOUNTER — Encounter: Payer: Self-pay | Admitting: Gastroenterology

## 2023-01-12 ENCOUNTER — Other Ambulatory Visit: Payer: Self-pay | Admitting: Internal Medicine

## 2023-01-17 ENCOUNTER — Encounter: Payer: Self-pay | Admitting: Rheumatology

## 2023-01-31 ENCOUNTER — Telehealth: Payer: Self-pay

## 2023-01-31 NOTE — Telephone Encounter (Signed)
Called patient to schedule Medicare Annual Wellness Visit (AWV). No voicemail available to leave a message.  Last date of AWV: 02/12/23  Please schedule an appointment at any time with [facility] on Annual Wellness Schedule.    Agnes Lawrence, CMA (AAMA)  [facility]- AWV Program

## 2023-03-08 DIAGNOSIS — H524 Presbyopia: Secondary | ICD-10-CM | POA: Diagnosis not present

## 2023-03-08 DIAGNOSIS — H353131 Nonexudative age-related macular degeneration, bilateral, early dry stage: Secondary | ICD-10-CM | POA: Diagnosis not present

## 2023-03-08 DIAGNOSIS — H40013 Open angle with borderline findings, low risk, bilateral: Secondary | ICD-10-CM | POA: Diagnosis not present

## 2023-03-08 DIAGNOSIS — H5203 Hypermetropia, bilateral: Secondary | ICD-10-CM | POA: Diagnosis not present

## 2023-03-08 DIAGNOSIS — H35033 Hypertensive retinopathy, bilateral: Secondary | ICD-10-CM | POA: Diagnosis not present

## 2023-03-08 DIAGNOSIS — H52223 Regular astigmatism, bilateral: Secondary | ICD-10-CM | POA: Diagnosis not present

## 2023-04-11 DIAGNOSIS — I10 Essential (primary) hypertension: Secondary | ICD-10-CM | POA: Diagnosis not present

## 2023-04-11 DIAGNOSIS — R251 Tremor, unspecified: Secondary | ICD-10-CM | POA: Diagnosis not present

## 2023-04-11 DIAGNOSIS — Z79899 Other long term (current) drug therapy: Secondary | ICD-10-CM | POA: Diagnosis not present

## 2023-04-11 DIAGNOSIS — M150 Primary generalized (osteo)arthritis: Secondary | ICD-10-CM | POA: Diagnosis not present

## 2023-04-11 DIAGNOSIS — M0609 Rheumatoid arthritis without rheumatoid factor, multiple sites: Secondary | ICD-10-CM | POA: Diagnosis not present

## 2023-04-11 DIAGNOSIS — E7800 Pure hypercholesterolemia, unspecified: Secondary | ICD-10-CM | POA: Diagnosis not present

## 2023-04-21 ENCOUNTER — Telehealth: Payer: Self-pay | Admitting: *Deleted

## 2023-04-21 NOTE — Telephone Encounter (Signed)
Attempted to  reach pt for pre-visit with stared # listed no answer. Will attempt other #listed in profile.Able to LM on second # listed gave contact # for call back and informed pt that I will attempt in 5 minutes to reach her for pre-visit but if not able to reach her to that she will need to contact the # given and reschedule her pre-visit or per protocol her procedure will be canceled. Unable to reach pt for pre-visit. Gave # to call again with instructions for her to call # given by end of day and reschedule pre-visit or her procedure will be canceled.

## 2023-04-25 ENCOUNTER — Encounter: Payer: Self-pay | Admitting: Gastroenterology

## 2023-04-25 ENCOUNTER — Ambulatory Visit (AMBULATORY_SURGERY_CENTER): Payer: Medicare HMO

## 2023-04-25 VITALS — Ht 65.0 in | Wt 119.0 lb

## 2023-04-25 DIAGNOSIS — Z86010 Personal history of colonic polyps: Secondary | ICD-10-CM

## 2023-04-25 MED ORDER — NA SULFATE-K SULFATE-MG SULF 17.5-3.13-1.6 GM/177ML PO SOLN: 1.0000 | Freq: Once | ORAL | 0 refills | Status: AC

## 2023-04-25 NOTE — Progress Notes (Signed)
No egg or soy allergy known to patient  No issues known to pt with past sedation with any surgeries or procedures Patient denies ever being told they had issues or difficulty with intubation  No FH of Malignant Hyperthermia Pt is not on diet pills Pt is not on  home 02  Pt is not on blood thinners  Pt denies issues with constipation  No A flutter, no AFIB Have any cardiac testing pending--no Pt instructed to use Singlecare.com or GoodRx for a price reduction on prep .Patient's chart reviewed by Cathlyn Parsons CNRA prior to previsit and patient appropriate for the [facility].  Previsit completed and red dot placed by patient's name on their procedure day (on provider's schedule).   Can ambulate independently

## 2023-05-08 NOTE — Telephone Encounter (Signed)
Patient needing to be further advised on prep.

## 2023-05-08 NOTE — Telephone Encounter (Signed)
Returned call to patient. Patient lost her instructions. New instructions were printed and will be left at the desk on the 2nd floor. Patient will pick them up on Tuesday 05/09/23.

## 2023-05-11 ENCOUNTER — Encounter: Payer: Self-pay | Admitting: Certified Registered Nurse Anesthetist

## 2023-05-12 ENCOUNTER — Ambulatory Visit (AMBULATORY_SURGERY_CENTER): Payer: Medicare HMO | Admitting: Gastroenterology

## 2023-05-12 ENCOUNTER — Encounter: Payer: Self-pay | Admitting: Gastroenterology

## 2023-05-12 VITALS — BP 144/63 | HR 53 | Temp 96.8°F | Resp 12 | Ht 60.0 in | Wt 119.0 lb

## 2023-05-12 DIAGNOSIS — Z86010 Personal history of colonic polyps: Secondary | ICD-10-CM

## 2023-05-12 DIAGNOSIS — Z83719 Family history of colon polyps, unspecified: Secondary | ICD-10-CM

## 2023-05-12 DIAGNOSIS — Z09 Encounter for follow-up examination after completed treatment for conditions other than malignant neoplasm: Secondary | ICD-10-CM

## 2023-05-12 DIAGNOSIS — M069 Rheumatoid arthritis, unspecified: Secondary | ICD-10-CM | POA: Diagnosis not present

## 2023-05-12 DIAGNOSIS — D122 Benign neoplasm of ascending colon: Secondary | ICD-10-CM | POA: Diagnosis not present

## 2023-05-12 DIAGNOSIS — I10 Essential (primary) hypertension: Secondary | ICD-10-CM | POA: Diagnosis not present

## 2023-05-12 DIAGNOSIS — D123 Benign neoplasm of transverse colon: Secondary | ICD-10-CM | POA: Diagnosis not present

## 2023-05-12 MED ORDER — SODIUM CHLORIDE 0.9 % IV SOLN: 500.0000 mL | INTRAVENOUS | Status: DC

## 2023-05-12 NOTE — Patient Instructions (Signed)
Handouts provided about high fiber diet, hemorrhoids, diverticulosis and polyps.  High fiber diet. Continue present medications.  Await pathology results.   YOU HAD AN ENDOSCOPIC PROCEDURE TODAY AT THE [facility]:   Refer to the procedure report that was given to you for any specific questions about what was found during the examination.  If the procedure report does not answer your questions, please call your gastroenterologist to clarify.  If you requested that your care partner not be given the details of your procedure findings, then the procedure report has been included in a sealed envelope for you to review at your convenience later.  YOU SHOULD EXPECT: Some feelings of bloating in the abdomen. Passage of more gas than usual.  Walking can help get rid of the air that was put into your GI tract during the procedure and reduce the bloating. If you had a lower endoscopy (such as a colonoscopy or flexible sigmoidoscopy) you may notice spotting of blood in your stool or on the toilet paper. If you underwent a bowel prep for your procedure, you may not have a normal bowel movement for a few days.  Please Note:  You might notice some irritation and congestion in your nose or some drainage.  This is from the oxygen used during your procedure.  There is no need for concern and it should clear up in a day or so.  SYMPTOMS TO REPORT IMMEDIATELY:  Following lower endoscopy (colonoscopy or flexible sigmoidoscopy):  Excessive amounts of blood in the stool  Significant tenderness or worsening of abdominal pains  Swelling of the abdomen that is new, acute  Fever of 100F or higher   For urgent or emergent issues, a gastroenterologist can be reached at any hour by calling [phone]. Do not use MyChart messaging for urgent concerns.    DIET:  We do recommend a small meal at first, but then you may proceed to your regular diet.  Drink plenty of fluids but you should avoid alcoholic  beverages for 24 hours.  ACTIVITY:  You should plan to take it easy for the rest of today and you should NOT DRIVE or use heavy machinery until tomorrow (because of the sedation medicines used during the test).    FOLLOW UP: Our staff will call the number listed on your records the next business day following your procedure.  We will call around 7:15- 8:00 am to check on you and address any questions or concerns that you may have regarding the information given to you following your procedure. If we do not reach you, we will leave a message.     If any biopsies were taken you will be contacted by phone or by letter within the next 1-3 weeks.  Please call us at [phone] if you have not heard about the biopsies in 3 weeks.    SIGNATURES/CONFIDENTIALITY: You and/or your care partner have signed paperwork which will be entered into your electronic medical record.  These signatures attest to the fact that that the information above on your After Visit Summary has been reviewed and is understood.  Full responsibility of the confidentiality of this discharge information lies with you and/or your care-partner.

## 2023-05-12 NOTE — Progress Notes (Signed)
Report given to PACU, vss 

## 2023-05-12 NOTE — Progress Notes (Signed)
Pt's states no medical or surgical changes since previsit or office visit. 

## 2023-05-12 NOTE — Progress Notes (Signed)
Called to room to assist during endoscopic procedure.  Patient ID and intended procedure confirmed with present staff. Received instructions for my participation in the procedure from the performing physician.  
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
## 2023-05-12 NOTE — Op Note (Signed)
[facility] Patient Name: [name] Procedure Date: 05/12/2023 9:06 AM MRN: [id number] Endoscopist: Meryl Dare , MD, [phone] Age: 77 Referring MD:  Date of Birth: [birth year] Gender: Female Account #: [id number] Procedure:                Colonoscopy Indications:              Surveillance: Personal history of adenomatous                            polyps on last colonoscopy 5 years ago, Family                            history of colon polyps Medicines:                Monitored Anesthesia Care Procedure:                Pre-Anesthesia Assessment:                           - Prior to the procedure, a History and Physical                            was performed, and patient medications and                            allergies were reviewed. The patient's tolerance of                            previous anesthesia was also reviewed. The risks                            and benefits of the procedure and the sedation                            options and risks were discussed with the patient.                            All questions were answered, and informed consent                            was obtained. Prior Anticoagulants: The patient has                            taken no anticoagulant or antiplatelet agents. ASA                            Grade Assessment: II - A patient with mild systemic                            disease. After reviewing the risks and benefits,                            the patient was deemed in satisfactory condition to  undergo the procedure.                           After obtaining informed consent, the colonoscope                            was passed under direct vision. Throughout the                            procedure, the patient's blood pressure, pulse, and                            oxygen saturations were monitored continuously. The                            [id number]-HQ190L Colonoscope [id number] was  introduced                            through the anus and advanced to the the cecum,                            identified by appendiceal orifice and ileocecal                            valve. The ileocecal valve, appendiceal orifice,                            and rectum were photographed. The quality of the                            bowel preparation was adequate. The colonoscopy was                            performed without difficulty. The patient tolerated                            the procedure well. Scope In: 9:19:41 AM Scope Out: 9:42:18 AM Scope Withdrawal Time: 0 hours 16 minutes 28 seconds  Total Procedure Duration: 0 hours 22 minutes 37 seconds  Findings:                 The perianal and digital rectal examinations were                            normal.                           A 3 mm polyp was found in the transverse colon. The                            polyp was sessile. The polyp was removed with a                            cold biopsy forceps. Resection and retrieval were  complete.                           Three sessile polyps were found in the ascending                            colon. The polyps were 4 to 5 mm in size. These                            polyps were removed with a cold snare. Resection                            and retrieval were complete.                           Multiple medium-mouthed and small-mouthed                            diverticula were found in the left colon. There was                            no evidence of diverticular bleeding.                           External hemorrhoids were found during                            retroflexion. The hemorrhoids were moderate.                           The exam was otherwise without abnormality on                            direct and retroflexion views. Complications:            No immediate complications. Estimated blood loss:                             None. Estimated Blood Loss:     Estimated blood loss: none. Impression:               - One 3 mm polyp in the transverse colon, removed                            with a cold biopsy forceps. Resected and retrieved.                           - Three 4 to 5 mm polyps in the ascending colon,                            removed with a cold snare. Resected and retrieved.                           - Mild diverticulosis in the left colon.                           -  External hemorrhoids.                           - The examination was otherwise normal on direct                            and retroflexion views. Recommendation:           - Repeat colonoscopy vs no repeat due to age after                            studies are complete for surveillance based on                            pathology results.                           - Patient has a contact number available for                            emergencies. The signs and symptoms of potential                            delayed complications were discussed with the                            patient. Return to normal activities tomorrow.                            Written discharge instructions were provided to the                            patient.                           - High fiber diet.                           - Continue present medications.                           - Await pathology results. Meryl Dare, MD 05/12/2023 9:46:35 AM This report has been signed electronically.

## 2023-05-15 ENCOUNTER — Telehealth: Payer: Self-pay

## 2023-05-15 NOTE — Telephone Encounter (Signed)
  Follow up Call-     05/12/2023    8:12 AM  Call back number  Post procedure Call Back phone  # [phone]  Permission to leave phone message Yes     Patient questions:  Do you have a fever, pain , or abdominal swelling? No. Pain Score  0 *  Have you tolerated food without any problems? Yes.    Have you been able to return to your normal activities? Yes.    Do you have any questions about your discharge instructions: Diet   No. Medications  No. Follow up visit  No.  Do you have questions or concerns about your Care? No.  Actions: * If pain score is 4 or above: No action needed, pain <4.

## 2023-05-24 ENCOUNTER — Ambulatory Visit (INDEPENDENT_AMBULATORY_CARE_PROVIDER_SITE_OTHER): Payer: Medicare HMO | Admitting: Internal Medicine

## 2023-05-24 VITALS — BP 128/70 | HR 74 | Temp 97.7°F | Ht 60.0 in | Wt 117.4 lb

## 2023-05-24 DIAGNOSIS — E7800 Pure hypercholesterolemia, unspecified: Secondary | ICD-10-CM

## 2023-05-24 DIAGNOSIS — E538 Deficiency of other specified B group vitamins: Secondary | ICD-10-CM

## 2023-05-24 DIAGNOSIS — I10 Essential (primary) hypertension: Secondary | ICD-10-CM | POA: Diagnosis not present

## 2023-05-24 DIAGNOSIS — E039 Hypothyroidism, unspecified: Secondary | ICD-10-CM | POA: Diagnosis not present

## 2023-05-24 DIAGNOSIS — R7302 Impaired glucose tolerance (oral): Secondary | ICD-10-CM

## 2023-05-24 DIAGNOSIS — E559 Vitamin D deficiency, unspecified: Secondary | ICD-10-CM | POA: Diagnosis not present
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
## 2023-05-26 ENCOUNTER — Encounter: Payer: Self-pay | Admitting: Gastroenterology

## 2023-05-26 ENCOUNTER — Encounter: Payer: Self-pay | Admitting: Internal Medicine

## 2023-05-26 NOTE — Assessment & Plan Note (Signed)
Lab Results  Component Value Date   TSH 2.08 11/22/2022   Stable, pt to continue levothyroxine 50 mcg qd

## 2023-05-26 NOTE — Assessment & Plan Note (Signed)
BP Readings from Last 3 Encounters:  05/24/23 128/70  05/12/23 (!) 144/63  11/22/22 128/76   Stable, pt to continue medical treatment zestoretic 20 25 mg every day, lopressor 50 bid

## 2023-05-26 NOTE — Assessment & Plan Note (Signed)
Lab Results  Component Value Date   HGBA1C 5.1 11/22/2022   Stable, pt to continue current medical treatment  - diet, wt control

## 2023-05-26 NOTE — Assessment & Plan Note (Signed)
Lab Results  Component Value Date   LDLCALC 72 11/22/2022   Uncontrolled, goal ldl < 70,, pt to continue current statin pravastatin 40 mg every day and lower chol diet, declines increase today

## 2023-05-26 NOTE — Assessment & Plan Note (Addendum)
Lab Results  Component Value Date   VITAMINB12 >1500 (H) 11/22/2022   Overcontrolled, ok to cont oral replacement - b12 1000 mcg at lower dosing mon - wed - fri

## 2023-05-26 NOTE — Assessment & Plan Note (Signed)
Last vitamin D Lab Results  Component Value Date   VD25OH 103.41 (HH) 11/22/2022   Overcontrolled, ok to cont oral replacement at mon wed fri

## 2023-05-26 NOTE — Patient Instructions (Signed)
Please continue all other medications as before, and refills have been done if requested.  Please have the pharmacy call with any other refills you may need.  Please continue your efforts at being more active, low cholesterol diet, and weight control.  Please keep your appointments with your specialists as you may have planned  Please make an Appointment to return in 6 months, or sooner if needed, also with Lab Appointment for testing done 3-5 days before at the FIRST FLOOR Lab (so this is for TWO appointments - please see the scheduling desk as you leave)  

## 2023-07-06 ENCOUNTER — Other Ambulatory Visit: Payer: Self-pay | Admitting: Internal Medicine

## 2023-07-06 ENCOUNTER — Other Ambulatory Visit: Payer: Self-pay

## 2023-07-18 DIAGNOSIS — M150 Primary generalized (osteo)arthritis: Secondary | ICD-10-CM | POA: Diagnosis not present

## 2023-07-18 DIAGNOSIS — R251 Tremor, unspecified: Secondary | ICD-10-CM | POA: Diagnosis not present

## 2023-07-18 DIAGNOSIS — I10 Essential (primary) hypertension: Secondary | ICD-10-CM | POA: Diagnosis not present

## 2023-07-18 DIAGNOSIS — Z79899 Other long term (current) drug therapy: Secondary | ICD-10-CM | POA: Diagnosis not present

## 2023-07-18 DIAGNOSIS — M0609 Rheumatoid arthritis without rheumatoid factor, multiple sites: Secondary | ICD-10-CM | POA: Diagnosis not present

## 2023-07-18 DIAGNOSIS — E7800 Pure hypercholesterolemia, unspecified: Secondary | ICD-10-CM | POA: Diagnosis not present

## 2023-07-26 ENCOUNTER — Ambulatory Visit (INDEPENDENT_AMBULATORY_CARE_PROVIDER_SITE_OTHER): Payer: Medicare HMO | Admitting: Radiology

## 2023-07-26 DIAGNOSIS — Z23 Encounter for immunization: Secondary | ICD-10-CM

## 2023-07-26 NOTE — Progress Notes (Signed)
Patient here for her flu shot. Patient tolerate well with no complications.

## 2023-08-20 ENCOUNTER — Other Ambulatory Visit: Payer: Self-pay | Admitting: Internal Medicine

## 2023-08-21 ENCOUNTER — Other Ambulatory Visit: Payer: Self-pay

## 2023-08-25 ENCOUNTER — Other Ambulatory Visit: Payer: Self-pay | Admitting: Internal Medicine

## 2023-08-25 DIAGNOSIS — Z1231 Encounter for screening mammogram for malignant neoplasm of breast: Secondary | ICD-10-CM

## 2023-09-05 ENCOUNTER — Encounter: Payer: Self-pay | Admitting: Internal Medicine

## 2023-09-05 ENCOUNTER — Ambulatory Visit: Payer: Medicare HMO | Admitting: Internal Medicine

## 2023-09-05 VITALS — BP 150/90 | HR 65 | Temp 99.3°F | Ht 60.0 in | Wt 120.0 lb

## 2023-09-05 DIAGNOSIS — E538 Deficiency of other specified B group vitamins: Secondary | ICD-10-CM

## 2023-09-05 DIAGNOSIS — E039 Hypothyroidism, unspecified: Secondary | ICD-10-CM

## 2023-09-05 DIAGNOSIS — R7302 Impaired glucose tolerance (oral): Secondary | ICD-10-CM | POA: Diagnosis not present

## 2023-09-05 DIAGNOSIS — E7800 Pure hypercholesterolemia, unspecified: Secondary | ICD-10-CM | POA: Diagnosis not present

## 2023-09-05 DIAGNOSIS — E559 Vitamin D deficiency, unspecified: Secondary | ICD-10-CM

## 2023-09-05 DIAGNOSIS — I10 Essential (primary) hypertension: Secondary | ICD-10-CM | POA: Diagnosis not present

## 2023-09-05 MED ORDER — EZETIMIBE 10 MG PO TABS: 10.0000 mg | ORAL_TABLET | Freq: Every day | ORAL | 3 refills | Status: DC

## 2023-09-05 NOTE — Patient Instructions (Addendum)
Ok to stay off the pravachol as you have been doing  Please take all new medication as prescribed - the zetia 10 mg per day  Please continue all other medications as before, and refills have been done if requested.  Please have the pharmacy call with any other refills you may need.  Please continue your efforts at being more active, low cholesterol diet, and weight control.  Please keep your appointments with your specialists as you may have planned  Please make an Appointment to return in Nov 24 2023, or sooner if needed, also with Lab Appointment for testing done 3-5 days before at the FIRST FLOOR Lab (so this is for TWO appointments - please see the scheduling desk as you leave)
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
## 2023-09-06 ENCOUNTER — Encounter: Payer: Self-pay | Admitting: Internal Medicine

## 2023-09-06 NOTE — Assessment & Plan Note (Signed)
Lab Results  Component Value Date   TSH 2.08 11/22/2022   Stable, pt to continue levothyroxine 50 mcg qd

## 2023-09-06 NOTE — Assessment & Plan Note (Signed)
Lab Results  Component Value Date   VITAMINB12 >1500 (H) 11/22/2022   Stable, cont oral replacement - b12 1000 mcg qd

## 2023-09-06 NOTE — Assessment & Plan Note (Signed)
Last vitamin D Lab Results  Component Value Date   VD25OH 103.41 ([place]) 11/22/2022   Stable, cont oral replacement

## 2023-09-06 NOTE — Assessment & Plan Note (Signed)
Lab Results  Component Value Date   LDLCALC 72 11/22/2022   Uncontrolled, pt states statin intolerant, now for zettia 10 every day, f/u lab feb 2025

## 2023-09-06 NOTE — Assessment & Plan Note (Signed)
Lab Results  Component Value Date   HGBA1C 5.1 11/22/2022   Stable, pt to continue current medical treatment  - diet, wt control

## 2023-09-06 NOTE — Assessment & Plan Note (Signed)
/   BP Readings from Last 3 Encounters:  09/05/23 (!) 150/90  05/24/23 128/70  05/12/23 (!) 144/63   Uncontrolled, pt stated controleld at home, dcines change today,, pt to continue medical treatment zestorestic 20 25 every day, lopressor 50 bid, hct 25 qd

## 2023-09-26 ENCOUNTER — Ambulatory Visit
Admission: RE | Admit: 2023-09-26 | Discharge: 2023-09-26 | Disposition: A | Discharge status: Home / Self Care | Payer: Medicare HMO | Source: Ambulatory Visit | Attending: Internal Medicine | Admitting: Internal Medicine

## 2023-09-26 DIAGNOSIS — Z1231 Encounter for screening mammogram for malignant neoplasm of breast: Secondary | ICD-10-CM | POA: Diagnosis not present

## 2023-09-27 ENCOUNTER — Encounter: Payer: Self-pay | Admitting: Family Medicine

## 2023-09-27 ENCOUNTER — Telehealth: Payer: Self-pay

## 2023-09-27 ENCOUNTER — Ambulatory Visit (INDEPENDENT_AMBULATORY_CARE_PROVIDER_SITE_OTHER): Payer: Medicare HMO | Admitting: Family Medicine

## 2023-09-27 VITALS — BP 160/80 | HR 74 | Temp 98.3°F | Ht 60.0 in | Wt 120.0 lb

## 2023-09-27 DIAGNOSIS — R300 Dysuria: Secondary | ICD-10-CM

## 2023-09-27 LAB — POCT URINALYSIS DIPSTICK
Bilirubin, UA: NEGATIVE
Blood, UA: NEGATIVE
Glucose, UA: NEGATIVE
Ketones, UA: NEGATIVE
Nitrite, UA: NEGATIVE
Protein, UA: NEGATIVE
Spec Grav, UA: 1.015 (ref 1.010–1.025)
Urobilinogen, UA: NEGATIVE U/dL — AB
pH, UA: 6.5 (ref 5.0–8.0)

## 2023-09-27 MED ORDER — CEPHALEXIN 500 MG PO CAPS: 500.0000 mg | ORAL_CAPSULE | Freq: Two times a day (BID) | ORAL | 0 refills | Status: AC

## 2023-09-27 NOTE — Progress Notes (Signed)
   Acute Office Visit  Subjective:     Patient ID: [name], female    DOB: 11/13/1945, 77 y.o.   MRN: [id number]  Chief Complaint  Patient presents with   Urinary Tract Infection    Pt states that she hurts in her lower back and have chills and being cold. Also having pain in the private area     HPI Patient is in today for evaluation of possible UTI. She has history of RA and does take methotrexate for this.  States she has not had a UTI in "quite a while." Reports lower abdominal pain, chills, dysuria, bilateral flank pain for the last 5 days. Has tried drinking cranberry juice with little relief. Denies nausea, vomiting, diarrhea, rash, fever, hematuria, other symptoms. Denies other concerns today. Medical history as outlined below  ROS Per HPI      Objective:    BP (!) 160/80 (BP Location: Left Arm, Patient Position: Sitting, Cuff Size: Normal)   Pulse 74   Temp 98.3 F (36.8 C) (Oral)   Ht 5' (1.524 m)   Wt 120 lb (54.4 kg)   SpO2 98%   BMI 23.44 kg/m    Physical Exam Vitals and nursing note reviewed.  Constitutional:      Appearance: Normal appearance. She is normal weight.  HENT:     Head: Normocephalic and atraumatic.  Eyes:     Extraocular Movements: Extraocular movements intact.  Cardiovascular:     Rate and Rhythm: Normal rate and regular rhythm.     Heart sounds: Normal heart sounds.  Pulmonary:     Effort: Pulmonary effort is normal.     Breath sounds: Normal breath sounds.  Abdominal:     Tenderness: There is abdominal tenderness (suprapubic). There is right CVA tenderness and left CVA tenderness.  Musculoskeletal:        General: Normal range of motion.     Cervical back: Normal range of motion.  Neurological:     General: No focal deficit present.     Mental Status: She is alert and oriented to person, place, and time.    Results for orders placed or performed in visit on 09/27/23  POCT Urinalysis Dipstick  Result Value Ref  Range   Color, UA     Clarity, UA     Glucose, UA Negative Negative   Bilirubin, UA negative    Ketones, UA negative    Spec Grav, UA 1.015 1.010 - 1.025   Blood, UA negative    pH, UA 6.5 5.0 - 8.0   Protein, UA Negative Negative   Urobilinogen, UA negative (A) 0.2 or 1.0 E.U./dL   Nitrite, UA negative    Leukocytes, UA Moderate (2+) (A) Negative   Appearance     Odor          Assessment & Plan:  1. Dysuria  - POCT Urinalysis Dipstick - cephALEXin (KEFLEX) 500 MG capsule; Take 1 capsule (500 mg total) by mouth 2 (two) times daily for 3 days.  Dispense: 6 capsule; Refill: 0 - Urine Culture; Future -Concern for possible pyelonephritis given chills, methotrexate use, clinical presentation   Meds ordered this encounter  Medications   cephALEXin (KEFLEX) 500 MG capsule    Sig: Take 1 capsule (500 mg total) by mouth 2 (two) times daily for 3 days.    Dispense:  6 capsule    Refill:  0    Return if symptoms worsen or fail to improve.  Moshe Cipro, FNP

## 2023-09-27 NOTE — Patient Instructions (Addendum)
I have sent in keflex for you to take twice a day for 3 days.   Your urine is suspicious for infection, so we are also going to culture your urine.  We are checking labs today, will be in contact with any results that require further attention

## 2023-09-27 NOTE — Telephone Encounter (Signed)
Sorry, no results yet from the exam yesterday on the chart  She would normally be notified by them anyway, especially if abnormal

## 2023-09-28 LAB — URINE CULTURE

## 2023-10-02 ENCOUNTER — Telehealth: Payer: Self-pay | Admitting: Internal Medicine

## 2023-10-02 NOTE — Telephone Encounter (Signed)
Ok to let pt know - I would not know how to answer this, as I did not see her at last visit.   She could make ROV or o/w go to UC or ED if pain is getting worse and more severe now   thanks

## 2023-10-02 NOTE — Telephone Encounter (Signed)
Pt back and side is still bothering her she was seen by Moshe Cipro and she mentioned that she is still feeling the same.. She's having a hard time using the bathroom "sometimes she can go and sometimes she can't. She saying she's dealing with a bacteria infection and is wondering "if she was just seen what needs to be done from here??" CB # : [phone] PLEASE ADVISE, [name]

## 2023-10-03 NOTE — Telephone Encounter (Signed)
Same answer, I am unable to answer her question as I am not sure of her reason or type or severity of pain

## 2023-10-03 NOTE — Telephone Encounter (Signed)
Pt also inquiring about more medication please.

## 2023-10-04 ENCOUNTER — Ambulatory Visit (INDEPENDENT_AMBULATORY_CARE_PROVIDER_SITE_OTHER): Payer: Medicare HMO | Admitting: Internal Medicine

## 2023-10-04 ENCOUNTER — Encounter: Payer: Self-pay | Admitting: Internal Medicine

## 2023-10-04 VITALS — BP 134/82 | HR 70 | Temp 98.3°F | Ht 60.0 in | Wt 120.0 lb

## 2023-10-04 DIAGNOSIS — I10 Essential (primary) hypertension: Secondary | ICD-10-CM | POA: Diagnosis not present

## 2023-10-04 DIAGNOSIS — R7302 Impaired glucose tolerance (oral): Secondary | ICD-10-CM

## 2023-10-04 DIAGNOSIS — R1031 Right lower quadrant pain: Secondary | ICD-10-CM | POA: Diagnosis not present

## 2023-10-04 DIAGNOSIS — R350 Frequency of micturition: Secondary | ICD-10-CM

## 2023-10-04 MED ORDER — TRAMADOL HCL 50 MG PO TABS: 50.0000 mg | ORAL_TABLET | Freq: Four times a day (QID) | ORAL | 0 refills | Status: DC | PRN

## 2023-10-04 NOTE — Telephone Encounter (Signed)
Pt has in office today.
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
## 2023-10-04 NOTE — Patient Instructions (Signed)
Please take all new medication as prescribed - the tramadol for pain as needed  Please also take miralax 17 gm by mouth once daily for possible constipation  Please continue all other medications as before, and refills have been done if requested.  Please have the pharmacy call with any other refills you may need.  Please keep your appointments with your specialists as you may have planned  You will be contacted regarding the referral for: CT scan - asap  Please go to the LAB at the blood drawing area for the tests to be done  You will be contacted by phone if any changes need to be made immediately.  Otherwise, you will receive a letter about your results with an explanation, but please check with MyChart first.

## 2023-10-05 ENCOUNTER — Other Ambulatory Visit: Payer: Self-pay | Admitting: Internal Medicine

## 2023-10-05 ENCOUNTER — Encounter: Payer: Self-pay | Admitting: Internal Medicine

## 2023-10-05 DIAGNOSIS — R1031 Right lower quadrant pain: Secondary | ICD-10-CM | POA: Insufficient documentation

## 2023-10-05 LAB — URINE CULTURE: Result:: NO GROWTH

## 2023-10-05 LAB — LIPASE: Lipase: 28 U/L (ref 11.0–59.0)

## 2023-10-05 NOTE — Assessment & Plan Note (Signed)
Etiology unclear, also for urine culture

## 2023-10-05 NOTE — Assessment & Plan Note (Signed)
BP Readings from Last 3 Encounters:  10/04/23 134/82  09/27/23 (!) 160/80  09/05/23 (!) 150/90   Stable, pt to continue medical treatment zestroetic 20 25 every day, lopressor 50 bid

## 2023-10-05 NOTE — Assessment & Plan Note (Signed)
Lab Results  Component Value Date   HGBA1C 5.1 11/22/2022   Stable, pt to continue current medical treatment  - diet, wt control

## 2023-10-05 NOTE — Assessment & Plan Note (Addendum)
Also with right side and flank area pain, ? Constipation but can't r/o renal stone or other - for CT abd pelvis, lipase, ua and cbc with labs as ordered, and tramadol prn pain

## 2023-10-06 ENCOUNTER — Telehealth: Payer: Self-pay

## 2023-10-06 NOTE — Telephone Encounter (Signed)
Reason for CRM: Patient stated she called the pharmacy about medication topiramate (TOPAMAX) 100 MG tablet and they stated the dr was not refilling it. Patient is requesting a callback regarding medication

## 2023-10-09 NOTE — Telephone Encounter (Signed)
Line was busy two times

## 2023-10-09 NOTE — Telephone Encounter (Signed)
It looks refilled was sent in on 08/21/2023

## 2023-10-13 ENCOUNTER — Encounter: Payer: Self-pay | Admitting: Internal Medicine

## 2023-10-13 ENCOUNTER — Ambulatory Visit
Admission: RE | Admit: 2023-10-13 | Discharge: 2023-10-13 | Disposition: A | Discharge status: Home / Self Care | Payer: Medicare HMO | Source: Ambulatory Visit | Attending: Internal Medicine | Admitting: Internal Medicine

## 2023-10-13 DIAGNOSIS — N200 Calculus of kidney: Secondary | ICD-10-CM | POA: Diagnosis not present

## 2023-10-13 DIAGNOSIS — R1031 Right lower quadrant pain: Secondary | ICD-10-CM | POA: Diagnosis not present

## 2023-10-16 DIAGNOSIS — H524 Presbyopia: Secondary | ICD-10-CM | POA: Diagnosis not present

## 2023-10-16 DIAGNOSIS — H52223 Regular astigmatism, bilateral: Secondary | ICD-10-CM | POA: Diagnosis not present

## 2023-11-09 ENCOUNTER — Telehealth: Payer: Self-pay | Admitting: Internal Medicine

## 2023-11-09 ENCOUNTER — Ambulatory Visit: Payer: Self-pay | Admitting: Internal Medicine

## 2023-11-09 MED ORDER — LEVOTHYROXINE SODIUM 50 MCG PO TABS: 50.0000 ug | ORAL_TABLET | Freq: Every day | ORAL | 3 refills | Status: AC

## 2023-11-09 NOTE — Telephone Encounter (Signed)
This is a second note - apparently pt needs a different brand sent to the same pharmacy - I will do this, thanks

## 2023-11-09 NOTE — Telephone Encounter (Signed)
1st attempt, called pt x3, first two attempts pt was unable to hear me. Pt did not answer, no VM. Routing for call back.  Copied from CRM [id number]. Topic: Clinical - Prescription Issue >> Nov 09, 2023  9:40 AM [name] wrote: Reason for CRM: Patient stated that the pharmacy sent her a letter stating that her medication  levothyroxine (SYNTHROID) 50 MCG tablet has a recall and she shouldn't be taking it. Patient needs an alternative because she did not take it this morning.

## 2023-11-09 NOTE — Telephone Encounter (Signed)
I imagine that a recall means the brand she has has been Held or Discontinued for some reason  It should be ok for a different brand, so let me know if you would want a new script sent to the same pharmacy, or a different pharmacy for some reason. thanks

## 2023-11-09 NOTE — Telephone Encounter (Signed)
Pt states she received a letter that her current Synthroid medication was recalled. She notes she just needs an alternative sent to her pharmacy. Please notify pt when this has been sent.

## 2023-11-09 NOTE — Telephone Encounter (Signed)
Patient called in to report that she received a letter from her pharmacy stating that her Synthroid had been recalled. The pharmacy instructed the patient to call the office. Patient is requesting a call back from the office with advice on how to proceed.   Reason for Disposition  [1] Caller requesting NON-URGENT health information AND [2] PCP's office is the best resource  Protocols used: Information Only Call - No Triage-A-AH

## 2023-11-09 NOTE — Telephone Encounter (Signed)
See below- second message

## 2023-11-09 NOTE — Addendum Note (Signed)
Addended by: Corwin Levins on: 11/09/2023 02:12 PM   Modules accepted: Orders

## 2023-11-10 ENCOUNTER — Other Ambulatory Visit: Payer: Self-pay | Admitting: Internal Medicine

## 2023-11-10 ENCOUNTER — Other Ambulatory Visit: Payer: Self-pay

## 2023-11-10 NOTE — Telephone Encounter (Signed)
Called and let Pt know

## 2023-11-16 DIAGNOSIS — I10 Essential (primary) hypertension: Secondary | ICD-10-CM | POA: Diagnosis not present

## 2023-11-16 DIAGNOSIS — M0609 Rheumatoid arthritis without rheumatoid factor, multiple sites: Secondary | ICD-10-CM | POA: Diagnosis not present

## 2023-11-16 DIAGNOSIS — R251 Tremor, unspecified: Secondary | ICD-10-CM | POA: Diagnosis not present

## 2023-11-16 DIAGNOSIS — E7800 Pure hypercholesterolemia, unspecified: Secondary | ICD-10-CM | POA: Diagnosis not present

## 2023-11-16 DIAGNOSIS — M150 Primary generalized (osteo)arthritis: Secondary | ICD-10-CM | POA: Diagnosis not present

## 2023-11-16 DIAGNOSIS — Z79899 Other long term (current) drug therapy: Secondary | ICD-10-CM | POA: Diagnosis not present

## 2023-11-20 ENCOUNTER — Other Ambulatory Visit (INDEPENDENT_AMBULATORY_CARE_PROVIDER_SITE_OTHER): Payer: Medicare HMO

## 2023-11-20 ENCOUNTER — Telehealth: Payer: Self-pay

## 2023-11-20 DIAGNOSIS — E559 Vitamin D deficiency, unspecified: Secondary | ICD-10-CM

## 2023-11-20 DIAGNOSIS — E7800 Pure hypercholesterolemia, unspecified: Secondary | ICD-10-CM

## 2023-11-20 DIAGNOSIS — R7302 Impaired glucose tolerance (oral): Secondary | ICD-10-CM

## 2023-11-20 DIAGNOSIS — E538 Deficiency of other specified B group vitamins: Secondary | ICD-10-CM

## 2023-11-20 LAB — HEPATIC FUNCTION PANEL
ALT: 14 U/L (ref 0–35)
AST: 19 U/L (ref 0–37)
Albumin: 3.9 g/dL (ref 3.5–5.2)
Alkaline Phosphatase: 73 U/L (ref 39–117)
Bilirubin, Direct: 0.1 mg/dL (ref 0.0–0.3)
Total Bilirubin: 0.5 mg/dL (ref 0.2–1.2)
Total Protein: 6.5 g/dL (ref 6.0–8.3)

## 2023-11-20 LAB — CBC WITH DIFFERENTIAL/PLATELET
Basophils Absolute: 0.1 10*3/uL (ref 0.0–0.1)
Basophils Relative: 1.2 % (ref 0.0–3.0)
Eosinophils Absolute: 0.2 10*3/uL (ref 0.0–0.7)
Eosinophils Relative: 3.2 % (ref 0.0–5.0)
HCT: 34.5 % — ABNORMAL LOW (ref 36.0–46.0)
Hemoglobin: 12 g/dL (ref 12.0–15.0)
Lymphocytes Relative: 25.8 % (ref 12.0–46.0)
Lymphs Abs: 1.3 10*3/uL (ref 0.7–4.0)
MCHC: 34.9 g/dL (ref 30.0–36.0)
MCV: 99.7 fL (ref 78.0–100.0)
Monocytes Absolute: 0.4 10*3/uL (ref 0.1–1.0)
Monocytes Relative: 8.8 % (ref 3.0–12.0)
Neutro Abs: 3 10*3/uL (ref 1.4–7.7)
Neutrophils Relative %: 61 % (ref 43.0–77.0)
Platelets: 279 10*3/uL (ref 150.0–400.0)
RBC: 3.46 Mil/uL — ABNORMAL LOW (ref 3.87–5.11)
RDW: 13.8 % (ref 11.5–15.5)
WBC: 4.9 10*3/uL (ref 4.0–10.5)

## 2023-11-20 LAB — LIPID PANEL
Cholesterol: 136 mg/dL (ref 0–200)
HDL: 55.8 mg/dL (ref >39.00)
LDL Cholesterol: 70 mg/dL (ref 0–99)
NonHDL: 80.62
Total CHOL/HDL Ratio: 2
Triglycerides: 51 mg/dL (ref 0.0–149.0)
VLDL: 10.2 mg/dL (ref 0.0–40.0)

## 2023-11-20 LAB — URINALYSIS, ROUTINE W REFLEX MICROSCOPIC
Bilirubin Urine: NEGATIVE
Hgb urine dipstick: NEGATIVE
Ketones, ur: NEGATIVE
Nitrite: NEGATIVE
RBC / HPF: NONE SEEN (ref 0–?)
Specific Gravity, Urine: 1.01 (ref 1.000–1.030)
Total Protein, Urine: NEGATIVE
Urine Glucose: NEGATIVE
Urobilinogen, UA: 0.2 (ref 0.0–1.0)
pH: 6.5 (ref 5.0–8.0)

## 2023-11-20 LAB — BASIC METABOLIC PANEL
BUN: 18 mg/dL (ref 6–23)
CO2: 24 meq/L (ref 19–32)
Calcium: 8.8 mg/dL (ref 8.4–10.5)
Chloride: 106 meq/L (ref 96–112)
Creatinine, Ser: 0.8 mg/dL (ref 0.40–1.20)
GFR: 70.52 mL/min (ref >60.00)
Glucose, Bld: 92 mg/dL (ref 70–99)
Potassium: 3.2 meq/L — ABNORMAL LOW (ref 3.5–5.1)
Sodium: 137 meq/L (ref 135–145)

## 2023-11-20 LAB — VITAMIN D 25 HYDROXY (VIT D DEFICIENCY, FRACTURES): VITD: >120 ng/mL

## 2023-11-20 LAB — HEMOGLOBIN A1C: Hgb A1c MFr Bld: 5.1 % (ref 4.6–6.5)

## 2023-11-20 LAB — MICROALBUMIN / CREATININE URINE RATIO
Creatinine,U: 41.1 mg/dL
Microalb Creat Ratio: 1.7 mg/g (ref 0.0–30.0)
Microalb, Ur: <0.7 mg/dL (ref 0.0–1.9)

## 2023-11-20 LAB — VITAMIN B12: Vitamin B-12: 831 pg/mL (ref 211–911)

## 2023-11-20 LAB — TSH: TSH: 2.65 u[IU]/mL (ref 0.35–5.50)

## 2023-11-24 ENCOUNTER — Encounter: Payer: Self-pay | Admitting: Internal Medicine

## 2023-11-24 ENCOUNTER — Ambulatory Visit (INDEPENDENT_AMBULATORY_CARE_PROVIDER_SITE_OTHER): Payer: Medicare HMO | Admitting: Internal Medicine

## 2023-11-24 VITALS — BP 122/76 | HR 64 | Temp 98.3°F | Ht 60.0 in | Wt 118.0 lb

## 2023-11-24 DIAGNOSIS — Z0000 Encounter for general adult medical examination without abnormal findings: Secondary | ICD-10-CM | POA: Diagnosis not present

## 2023-11-24 DIAGNOSIS — M21611 Bunion of right foot: Secondary | ICD-10-CM | POA: Diagnosis not present

## 2023-11-24 DIAGNOSIS — I10 Essential (primary) hypertension: Secondary | ICD-10-CM | POA: Diagnosis not present

## 2023-11-24 DIAGNOSIS — E538 Deficiency of other specified B group vitamins: Secondary | ICD-10-CM

## 2023-11-24 DIAGNOSIS — E559 Vitamin D deficiency, unspecified: Secondary | ICD-10-CM

## 2023-11-24 DIAGNOSIS — Z0001 Encounter for general adult medical examination with abnormal findings: Secondary | ICD-10-CM

## 2023-11-24 DIAGNOSIS — E7800 Pure hypercholesterolemia, unspecified: Secondary | ICD-10-CM | POA: Diagnosis not present

## 2023-11-24 DIAGNOSIS — E039 Hypothyroidism, unspecified: Secondary | ICD-10-CM | POA: Diagnosis not present

## 2023-11-24 DIAGNOSIS — R7302 Impaired glucose tolerance (oral): Secondary | ICD-10-CM
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
## 2023-11-24 NOTE — Patient Instructions (Signed)
 Please continue all other medications as before, and refills have been done if requested.  Please have the pharmacy call with any other refills you may need.  Please continue your efforts at being more active, low cholesterol diet, and weight control.  You are otherwise up to date with prevention measures today.  Please keep your appointments with your specialists as you may have planned  Please call if you change your mind about seeing podiatry  Please make an Appointment to return in 6 months, or sooner if needed

## 2023-11-26 ENCOUNTER — Encounter: Payer: Self-pay | Admitting: Internal Medicine

## 2023-11-26 DIAGNOSIS — M21611 Bunion of right foot: Secondary | ICD-10-CM | POA: Insufficient documentation

## 2023-11-26 NOTE — Assessment & Plan Note (Signed)
 BP Readings from Last 3 Encounters:  11/24/23 122/76  10/04/23 134/82  09/27/23 (!) 160/80   Stable, pt to continue medical treatment zestoretic  20 25 every day, lopressor  50 bid,

## 2023-11-26 NOTE — Assessment & Plan Note (Signed)
 Mild to mod, for otc volt gel prn,  to f/u any worsening symptoms or concerns

## 2023-11-26 NOTE — Assessment & Plan Note (Signed)
 Age and sex appropriate education and counseling updated with regular exercise and diet Referrals for preventative services - none needed Immunizations addressed - for shingrix at pharmacy, declines covid booster Smoking counseling  - none needed Evidence for depression or other mood disorder - none significant Most recent labs reviewed. I have personally reviewed and have noted: 1) the patient's medical and social history 2) The patient's current medications and supplements 3) The patient's height, weight, and BMI have been recorded in the chart

## 2023-11-26 NOTE — Assessment & Plan Note (Signed)
 Lab Results  Component Value Date   TSH 2.65 11/20/2023   Stable, pt to continue levothyroxine  50 mg qd

## 2023-11-26 NOTE — Assessment & Plan Note (Signed)
 Lab Results  Component Value Date   LDLCALC 70 11/20/2023   Stable, pt to continue current statin zetia  10 qd

## 2023-11-26 NOTE — Assessment & Plan Note (Signed)
 Last vitamin D  Lab Results  Component Value Date   VD25OH >120 11/20/2023   Stable, cont oral replacement

## 2023-11-26 NOTE — Assessment & Plan Note (Signed)
 Lab Results  Component Value Date   HGBA1C 5.1 11/20/2023   Stable, pt to continue current medical treatment  - diet, wt control

## 2023-11-26 NOTE — Assessment & Plan Note (Signed)
 Lab Results  Component Value Date   VITAMINB12 831 11/20/2023   Stable, cont oral replacement - b12 1000 mcg qd

## 2023-12-12 DIAGNOSIS — Z01419 Encounter for gynecological examination (general) (routine) without abnormal findings: Secondary | ICD-10-CM | POA: Diagnosis not present

## 2024-01-02 ENCOUNTER — Ambulatory Visit (INDEPENDENT_AMBULATORY_CARE_PROVIDER_SITE_OTHER): Payer: Medicare HMO

## 2024-01-02 VITALS — Ht 60.0 in | Wt 118.0 lb

## 2024-01-02 DIAGNOSIS — Z1212 Encounter for screening for malignant neoplasm of rectum: Secondary | ICD-10-CM

## 2024-01-02 DIAGNOSIS — Z0000 Encounter for general adult medical examination without abnormal findings: Secondary | ICD-10-CM

## 2024-01-02 DIAGNOSIS — Z1211 Encounter for screening for malignant neoplasm of colon: Secondary | ICD-10-CM

## 2024-01-02 NOTE — Patient Instructions (Signed)
 [name] , Thank you for taking time to come for your Medicare Wellness Visit. I appreciate your ongoing commitment to your health goals. Please review the following plan we discussed and let me know if I can assist you in the future.   Referrals/Orders/Follow-Ups/Clinician Recommendations: It was nice talking to you today.  You are due for a 2nd Shingles vaccine.  You have an order for:  [x]   Bone Density     Please call for appointment:  [facility] [address] [place], [place] [address] [phone]  Make sure to wear two-piece clothing.  No lotions, powders, or deodorants the day of the appointment. Make sure to bring picture ID and insurance card.  Bring list of medications you are currently taking including any supplements.    This is a list of the screening recommended for you and due dates:  Health Maintenance  Topic Date Due   Zoster (Shingles) Vaccine (2 of 2) 08/17/2022   COVID-19 Vaccine (8 - Pfizer risk 2024-25 season) 12/28/2023   Medicare Annual Wellness Visit  01/01/2025   Colon Cancer Screening  05/11/2026   DTaP/Tdap/Td vaccine (3 - Td or Tdap) 04/26/2032   Pneumonia Vaccine  Completed   Flu Shot  Completed   DEXA scan (bone density measurement)  Completed   Hepatitis C Screening  Completed   HPV Vaccine  Aged Out    Advanced directives: (In Chart) A copy of your advanced directives are scanned into your chart should your provider ever need it.  Next Medicare Annual Wellness Visit scheduled for next year: Yes
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
## 2024-01-09 ENCOUNTER — Telehealth: Payer: Self-pay | Admitting: Internal Medicine

## 2024-01-09 NOTE — Telephone Encounter (Unsigned)
 Copied from CRM [id number]. Topic: General - Other >> Jan 09, 2024  2:51 PM Denese Killings wrote: Reason for CRM: Patient wants to let the nurse know that she did have her 2nd shingle shot on 06/22/2022  1st-04/18/2022 2nd- 06/22/2022

## 2024-01-10 NOTE — Telephone Encounter (Signed)
Pt chart has been updated.

## 2024-01-30 ENCOUNTER — Encounter (HOSPITAL_BASED_OUTPATIENT_CLINIC_OR_DEPARTMENT_OTHER): Payer: Self-pay | Admitting: Pharmacy Technician

## 2024-01-30 ENCOUNTER — Emergency Department (HOSPITAL_BASED_OUTPATIENT_CLINIC_OR_DEPARTMENT_OTHER)

## 2024-01-30 ENCOUNTER — Other Ambulatory Visit: Payer: Self-pay

## 2024-01-30 ENCOUNTER — Inpatient Hospital Stay (HOSPITAL_BASED_OUTPATIENT_CLINIC_OR_DEPARTMENT_OTHER)
Admission: EM | Admit: 2024-01-30 | Discharge: 2024-02-06 | DRG: 481 | Disposition: A | Discharge status: Skilled Nursing Facility | Attending: Internal Medicine | Admitting: Internal Medicine

## 2024-01-30 DIAGNOSIS — Y92009 Unspecified place in unspecified non-institutional (private) residence as the place of occurrence of the external cause: Secondary | ICD-10-CM

## 2024-01-30 DIAGNOSIS — Z860101 Personal history of adenomatous and serrated colon polyps: Secondary | ICD-10-CM

## 2024-01-30 DIAGNOSIS — Z86718 Personal history of other venous thrombosis and embolism: Secondary | ICD-10-CM

## 2024-01-30 DIAGNOSIS — Z90710 Acquired absence of both cervix and uterus: Secondary | ICD-10-CM

## 2024-01-30 DIAGNOSIS — Z91018 Allergy to other foods: Secondary | ICD-10-CM

## 2024-01-30 DIAGNOSIS — S72121A Displaced fracture of lesser trochanter of right femur, initial encounter for closed fracture: Secondary | ICD-10-CM | POA: Diagnosis not present

## 2024-01-30 DIAGNOSIS — W19XXXA Unspecified fall, initial encounter: Secondary | ICD-10-CM

## 2024-01-30 DIAGNOSIS — Z9842 Cataract extraction status, left eye: Secondary | ICD-10-CM

## 2024-01-30 DIAGNOSIS — Z555 Less than a high school diploma: Secondary | ICD-10-CM

## 2024-01-30 DIAGNOSIS — I959 Hypotension, unspecified: Secondary | ICD-10-CM | POA: Diagnosis not present

## 2024-01-30 DIAGNOSIS — S72009A Fracture of unspecified part of neck of unspecified femur, initial encounter for closed fracture: Secondary | ICD-10-CM | POA: Diagnosis present

## 2024-01-30 DIAGNOSIS — E785 Hyperlipidemia, unspecified: Secondary | ICD-10-CM | POA: Diagnosis present

## 2024-01-30 DIAGNOSIS — S72001A Fracture of unspecified part of neck of right femur, initial encounter for closed fracture: Principal | ICD-10-CM

## 2024-01-30 DIAGNOSIS — Z83719 Family history of colon polyps, unspecified: Secondary | ICD-10-CM

## 2024-01-30 DIAGNOSIS — Z79899 Other long term (current) drug therapy: Secondary | ICD-10-CM

## 2024-01-30 DIAGNOSIS — W010XXA Fall on same level from slipping, tripping and stumbling without subsequent striking against object, initial encounter: Secondary | ICD-10-CM | POA: Diagnosis present

## 2024-01-30 DIAGNOSIS — N179 Acute kidney failure, unspecified: Secondary | ICD-10-CM | POA: Diagnosis not present

## 2024-01-30 DIAGNOSIS — Z823 Family history of stroke: Secondary | ICD-10-CM

## 2024-01-30 DIAGNOSIS — Z888 Allergy status to other drugs, medicaments and biological substances status: Secondary | ICD-10-CM

## 2024-01-30 DIAGNOSIS — S72141A Displaced intertrochanteric fracture of right femur, initial encounter for closed fracture: Principal | ICD-10-CM | POA: Diagnosis present

## 2024-01-30 DIAGNOSIS — Z9841 Cataract extraction status, right eye: Secondary | ICD-10-CM

## 2024-01-30 DIAGNOSIS — M069 Rheumatoid arthritis, unspecified: Secondary | ICD-10-CM | POA: Diagnosis present

## 2024-01-30 DIAGNOSIS — Z882 Allergy status to sulfonamides status: Secondary | ICD-10-CM

## 2024-01-30 DIAGNOSIS — M51369 Other intervertebral disc degeneration, lumbar region without mention of lumbar back pain or lower extremity pain: Secondary | ICD-10-CM | POA: Diagnosis not present

## 2024-01-30 DIAGNOSIS — G250 Essential tremor: Secondary | ICD-10-CM | POA: Diagnosis present

## 2024-01-30 DIAGNOSIS — R791 Abnormal coagulation profile: Secondary | ICD-10-CM | POA: Diagnosis not present

## 2024-01-30 DIAGNOSIS — Z885 Allergy status to narcotic agent status: Secondary | ICD-10-CM

## 2024-01-30 DIAGNOSIS — I4891 Unspecified atrial fibrillation: Secondary | ICD-10-CM | POA: Diagnosis present

## 2024-01-30 DIAGNOSIS — Z79890 Hormone replacement therapy (postmenopausal): Secondary | ICD-10-CM

## 2024-01-30 DIAGNOSIS — Z800 Family history of malignant neoplasm of digestive organs: Secondary | ICD-10-CM

## 2024-01-30 DIAGNOSIS — Z7982 Long term (current) use of aspirin: Secondary | ICD-10-CM

## 2024-01-30 DIAGNOSIS — Z8249 Family history of ischemic heart disease and other diseases of the circulatory system: Secondary | ICD-10-CM

## 2024-01-30 DIAGNOSIS — E039 Hypothyroidism, unspecified: Secondary | ICD-10-CM | POA: Diagnosis present

## 2024-01-30 DIAGNOSIS — Z881 Allergy status to other antibiotic agents status: Secondary | ICD-10-CM

## 2024-01-30 DIAGNOSIS — R7989 Other specified abnormal findings of blood chemistry: Secondary | ICD-10-CM | POA: Diagnosis present

## 2024-01-30 DIAGNOSIS — K219 Gastro-esophageal reflux disease without esophagitis: Secondary | ICD-10-CM | POA: Diagnosis present

## 2024-01-30 DIAGNOSIS — Z961 Presence of intraocular lens: Secondary | ICD-10-CM | POA: Diagnosis present

## 2024-01-30 DIAGNOSIS — I10 Essential (primary) hypertension: Secondary | ICD-10-CM | POA: Diagnosis present

## 2024-01-30 LAB — BASIC METABOLIC PANEL WITH GFR
Anion gap: 11 (ref 5–15)
BUN: 29 mg/dL — ABNORMAL HIGH (ref 8–23)
CO2: 19 mmol/L — ABNORMAL LOW (ref 22–32)
Calcium: 9.1 mg/dL (ref 8.9–10.3)
Chloride: 106 mmol/L (ref 98–111)
Creatinine, Ser: 0.76 mg/dL (ref 0.44–1.00)
GFR, Estimated: >60 mL/min (ref >60)
Glucose, Bld: 104 mg/dL — ABNORMAL HIGH (ref 70–99)
Potassium: 3.4 mmol/L — ABNORMAL LOW (ref 3.5–5.1)
Sodium: 136 mmol/L (ref 135–145)

## 2024-01-30 LAB — CBC
HCT: 34 % — ABNORMAL LOW (ref 36.0–46.0)
Hemoglobin: 12.1 g/dL (ref 12.0–15.0)
MCH: 35.2 pg — ABNORMAL HIGH (ref 26.0–34.0)
MCHC: 35.6 g/dL (ref 30.0–36.0)
MCV: 98.8 fL (ref 80.0–100.0)
Platelets: 216 10*3/uL (ref 150–400)
RBC: 3.44 MIL/uL — ABNORMAL LOW (ref 3.87–5.11)
RDW: 13.7 % (ref 11.5–15.5)
WBC: 11.1 10*3/uL — ABNORMAL HIGH (ref 4.0–10.5)
nRBC: 0 % (ref 0.0–0.2)

## 2024-01-30 MED ORDER — ACETAMINOPHEN 325 MG PO TABS: 650.0000 mg | ORAL_TABLET | Freq: Once | ORAL | Status: DC

## 2024-01-30 MED ORDER — POTASSIUM CHLORIDE 20 MEQ PO PACK
20.0000 meq | PACK | Freq: Once | ORAL | Status: AC
  Administered 2024-01-30: 20 meq via ORAL
  Filled 2024-01-30: qty 1

## 2024-01-30 NOTE — ED Triage Notes (Signed)
 Pt bib family with reports of mechanical fall onto the floor. Pt unable to get herself up after fall. Pt endorses unable to bear weight on R leg due to R hip pain. Denies hitting head, not on anticoags.

## 2024-01-30 NOTE — ED Provider Notes (Signed)
 [facility] Provider Note   CSN: [id number] Arrival date & time: 01/30/24  1542     History  Chief Complaint  Patient presents with   Marletta Lor    [name] is a 78 y.o. female.   Fall   78 year old female presents to the emergency department with complaint of right hip pain.  Patient states that she was walking in her living room and fell that she misstep and lost her balance causing her to fall landing on her right hip.  Denies trauma to head, LOC, blood thinner use.  Denies pain elsewhere.  States that she was on the ground for around 10 minutes before feel member helped her back up.  Has not been able to put any weight on her right leg secondary to right hip pain.  Denies any chest pain, shortness of breath, abdominal pain, nausea, vomiting, pain in upper or lower extremities otherwise.  Past medical history significant for RA, OA, tachybradycardia syndrome, GERD, DVT, gastritis, hypertension, hyperlipidemia, atrial fibrillation  Home Medications Prior to Admission medications   Medication Sig Start Date End Date Taking? Authorizing Provider  acetaminophen (TYLENOL) 500 MG tablet Take 1,000 mg by mouth daily as needed (pain). Patient not taking: Reported on 01/02/2024    [provider]  albuterol (VENTOLIN HFA) 108 (90 Base) MCG/ACT inhaler TAKE 2 PUFFS BY MOUTH EVERY 6 HOURS AS NEEDED FOR WHEEZE OR SHORTNESS OF BREATH 12/16/20   Corwin Levins, MD  ascorbic acid (VITAMIN C) 500 MG tablet Take 1,000 mg by mouth daily.    [provider]  aspirin 81 MG tablet Take 81 mg by mouth every other day.    [provider]  Cholecalciferol (VITAMIN D) 125 MCG (5000 UT) CAPS Take 5,000 Units by mouth daily in the afternoon.    [provider]  Cyanocobalamin (B-12 PO) Take 1 tablet by mouth daily.    [provider]  ezetimibe (ZETIA) 10 MG tablet Take 1 tablet (10 mg total) by mouth daily. 09/05/23    Corwin Levins, MD  folic acid (FOLVITE) 1 MG tablet Take 2 mg by mouth daily.     [provider]  hydrochlorothiazide (HYDRODIURIL) 25 MG tablet TAKE 1 TABLET (25 MG TOTAL) BY MOUTH DAILY. 08/29/22 08/24/23  Corwin Levins, MD  ibuprofen (ADVIL) 600 MG tablet Take 1 tablet (600 mg total) by mouth every 6 (six) hours as needed. 11/18/21   Marguerita Beards, MD  levothyroxine (SYNTHROID) 50 MCG tablet Take 1 tablet (50 mcg total) by mouth daily. 11/09/23   Corwin Levins, MD  lisinopril-hydrochlorothiazide (ZESTORETIC) 20-25 MG tablet Take 1 tablet by mouth daily.    [provider]  methotrexate (RHEUMATREX) 2.5 MG tablet Take 17.5 mg by mouth once a week. Wednesday's 04/13/14   [provider]  methotrexate (RHEUMATREX) 2.5 MG tablet     [provider]  metoprolol tartrate (LOPRESSOR) 50 MG tablet TAKE 1 TABLET BY MOUTH TWICE A DAY 11/10/23   Corwin Levins, MD  Multiple Vitamins-Minerals (CENTRUM SILVER PO) Take 1 tablet by mouth daily.    [provider]  Omega-3 Fatty Acids (FISH OIL PO) Take 1-2 capsules by mouth daily.    [provider]  pantoprazole (PROTONIX) 40 MG tablet TAKE 1 TABLET BY MOUTH EVERY DAY 07/06/23   Corwin Levins, MD  polyethylene glycol powder (GLYCOLAX/MIRALAX) 17 GM/SCOOP powder Take 17 g by mouth daily. Drink 17g (1 scoop) dissolved  in water per day. Patient taking differently: Take 17 g by mouth daily as needed for mild constipation. 11/18/21   Arma Lamp, MD  topiramate (TOPAMAX) 100 MG tablet TAKE 1 TABLET BY MOUTH TWICE A DAY 08/21/23   Roslyn Coombe, MD  traMADol (ULTRAM) 50 MG tablet Take 1 tablet (50 mg total) by mouth every 6 (six) hours as needed. Patient not taking: Reported on 01/02/2024 10/04/23   Roslyn Coombe, MD  triamcinolone cream (KENALOG) 0.1 % APPLY TO AFFECTED AREA TWICE A DAY 01/12/23   Roslyn Coombe, MD      Allergies    Allegra [fexofenadine], Atenolol, Ciprofloxacin, Codeine, Cortisone,  Doxycycline, Fosamax [alendronate], Hydrochlorothiazide, Klonopin [clonazepam], Lisinopril, Prednisone, Ramipril, Sulfa antibiotics, Sulfamethoxazole-trimethoprim, Tizanidine, Verapamil, and Chocolate    Review of Systems   Review of Systems  All other systems reviewed and are negative.   Physical Exam Updated Vital Signs BP (!) 135/48   Pulse 76   Temp 99.2 F (37.3 C) (Oral)   Resp 15   SpO2 98%  Physical Exam Vitals and nursing note reviewed.  Constitutional:      General: She is not in acute distress.    Appearance: She is well-developed.  HENT:     Head: Normocephalic and atraumatic.  Eyes:     Conjunctiva/sclera: Conjunctivae normal.  Cardiovascular:     Rate and Rhythm: Normal rate and regular rhythm.  Pulmonary:     Effort: Pulmonary effort is normal. No respiratory distress.     Breath sounds: Normal breath sounds.  Abdominal:     Palpations: Abdomen is soft.     Tenderness: There is no abdominal tenderness.  Musculoskeletal:        General: No swelling.     Cervical back: Neck supple.     Comments: No midline tenderness cervical, thoracic, lumbar spine without step-off or deformity.  Tender palpation right proximal hip.  Pain with ranging right hip any direction.  Right lower extremity shortened with external rotation.  Otherwise, no reproducible tenderness bilateral upper or lower extremities.  No chest wall tenderness.  Pedal and radial pulses 2+ bilaterally.  Skin:    General: Skin is warm and dry.     Capillary Refill: Capillary refill takes less than 2 seconds.  Neurological:     Mental Status: She is alert.  Psychiatric:        Mood and Affect: Mood normal.     ED Results / Procedures / Treatments   Labs (all labs ordered are listed, but only abnormal results are displayed) Labs Reviewed  BASIC METABOLIC PANEL WITH GFR - Abnormal; Notable for the following components:      Result Value   Potassium 3.4 (*)    CO2 19 (*)    Glucose, Bld 104 (*)     BUN 29 (*)    All other components within normal limits  CBC - Abnormal; Notable for the following components:   WBC 11.1 (*)    RBC 3.44 (*)    HCT 34.0 (*)    MCH 35.2 (*)    All other components within normal limits    EKG None  Radiology DG Hip Unilat  With Pelvis 2-3 Views Right Result Date: 01/30/2024 CLINICAL DATA:  Fall, right hip pain EXAM: DG HIP (WITH OR WITHOUT PELVIS) 2-3V RIGHT COMPARISON:  09/30/2016 FINDINGS: Right proximal femoral intertrochanteric fracture, with deformity observed and cortical discontinuity along the lesser trochanter favoring intertrochanteric fracture over basicervical femoral neck fracture. No other fracture  identified. Spurring and intervertebral space narrowing at L4-5 and L5-S1. IMPRESSION: 1. Right proximal femoral intertrochanteric fracture. 2. Degenerative disc disease at L4-5 and L5-S1. Electronically Signed   By: Gaylyn Rong M.D.   On: 01/30/2024 18:45    Procedures Procedures    Medications Ordered in ED Medications  acetaminophen (TYLENOL) tablet 650 mg (650 mg Oral Patient Refused/Not Given 01/30/24 1737)  potassium chloride (KLOR-CON) packet 20 mEq (has no administration in time range)    ED Course/ Medical Decision Making/ A&P Clinical Course as of 01/30/24 2053  Tue Jan 30, 2024  1943 Consult to Dr. Shon Baton of orthopedics.  Recommended admission to [facility], n.p.o. after midnight with planned surgical intervention tomorrow.  Will consult hospitalist. [CR]  2052 Consulted Dr. Toniann Fail who agreed with admission and seen further treatment/care. [CR]    Clinical Course User Index [CR] Peter Garter, PA                                 Medical Decision Making Amount and/or Complexity of Data Reviewed Labs: ordered. Radiology: ordered.  Risk OTC drugs. Decision regarding hospitalization.   This patient presents to the ED for concern of fall, right hip pain, this involves an extensive number of treatment  options, and is a complaint that carries with it a high risk of complications and morbidity.  The differential diagnosis includes fracture, strain/pain, dislocation, ligamentous/tendinous injury, rest, ice, pneumothorax, CVA, other   Co morbidities that complicate the patient evaluation  See HPI   Additional history obtained:  Additional history obtained from EMR External records from outside source obtained and reviewed including hospital records   Lab Tests:  I Ordered, and personally interpreted labs.  The pertinent results include: Leukocytosis 11.  No evidence of anemia.  Platelets within range.  Mild hypokalemia 3.4, decreased bicarb of 19 otherwise, S within limits.  No renal dysfunction.   Imaging Studies ordered:  I ordered imaging studies including pelvis x-ray with right hip I independently visualized and interpreted imaging which showed right proximal femoral intertrochanteric fracture.  Degenerative disc disease. I agree with the radiologist interpretation  Cardiac Monitoring: / EKG:  The patient was maintained on a cardiac monitor.  I personally viewed and interpreted the cardiac monitored which showed an underlying rhythm of: Sinus rhythm   Consultations Obtained:  See ED course  Problem List / ED Course / Critical interventions / Medication management  Right hip fracture, fall I ordered medication including Tylenol   Reevaluation of the patient after these medicines showed that the patient improved I have reviewed the patients home medicines and have made adjustments as needed   Social Determinants of Health:  Denies tobacco, licit drug use.   Test / Admission - Considered:  Right fracture, fall Vitals signs significant for hypertension blood pressure 168/68. Otherwise within normal range and stable throughout visit. Laboratory/imaging studies significant for: See above 78 year old female presents to the emergency department with complaint of right  hip pain.  Patient states that she was walking in her living room and fell that she misstep and lost her balance causing her to fall landing on her right hip.  Denies trauma to head, LOC, blood thinner use.  Denies pain elsewhere.  States that she was on the ground for around 10 minutes before feel member helped her back up.  Has not been able to put any weight on her right leg secondary to right hip pain.  Denies any chest pain, shortness of breath, abdominal pain, nausea, vomiting, pain in upper or lower extremities otherwise. On exam, tender palpation right proximal hip.  Leg shortened and externally rotated.  Pulses intact bilateral lower extremities.  X-ray obtained which is concerning for right-sided intertrochanteric fracture.  Consulted orthopedics who recommended n.p.o. after midnight, admission through hospitalist with planned intervention tomorrow.  Consulted hospitalist who agreed with admission. Treatment plan were discussed at length with patient and they knowledge understanding was agreeable to said plan.  Appropriate consultations were made as described in the ED course.  Patient was stable upon admission to the hospital.         Final Clinical Impression(s) / ED Diagnoses Final diagnoses:  Closed fracture of right hip, initial encounter [facility])  Fall, initial encounter    Rx / DC Orders ED Discharge Orders     None         Corning Butter, [place] 01/30/24 2053    Scarlette Currier, MD 01/31/24 1303

## 2024-01-31 ENCOUNTER — Inpatient Hospital Stay (HOSPITAL_COMMUNITY)

## 2024-01-31 ENCOUNTER — Inpatient Hospital Stay (HOSPITAL_COMMUNITY): Admitting: Anesthesiology

## 2024-01-31 DIAGNOSIS — S72001A Fracture of unspecified part of neck of right femur, initial encounter for closed fracture: Secondary | ICD-10-CM | POA: Diagnosis not present

## 2024-01-31 DIAGNOSIS — J309 Allergic rhinitis, unspecified: Secondary | ICD-10-CM | POA: Diagnosis not present

## 2024-01-31 DIAGNOSIS — I700 Atherosclerosis of aorta: Secondary | ICD-10-CM | POA: Diagnosis not present

## 2024-01-31 DIAGNOSIS — M6281 Muscle weakness (generalized): Secondary | ICD-10-CM | POA: Diagnosis not present

## 2024-01-31 DIAGNOSIS — Z9842 Cataract extraction status, left eye: Secondary | ICD-10-CM | POA: Diagnosis not present

## 2024-01-31 DIAGNOSIS — I495 Sick sinus syndrome: Secondary | ICD-10-CM | POA: Diagnosis not present

## 2024-01-31 DIAGNOSIS — I10 Essential (primary) hypertension: Secondary | ICD-10-CM

## 2024-01-31 DIAGNOSIS — R519 Headache, unspecified: Secondary | ICD-10-CM | POA: Diagnosis not present

## 2024-01-31 DIAGNOSIS — Z01818 Encounter for other preprocedural examination: Secondary | ICD-10-CM | POA: Diagnosis not present

## 2024-01-31 DIAGNOSIS — Z90710 Acquired absence of both cervix and uterus: Secondary | ICD-10-CM | POA: Diagnosis not present

## 2024-01-31 DIAGNOSIS — Z881 Allergy status to other antibiotic agents status: Secondary | ICD-10-CM | POA: Diagnosis not present

## 2024-01-31 DIAGNOSIS — Z888 Allergy status to other drugs, medicaments and biological substances status: Secondary | ICD-10-CM | POA: Diagnosis not present

## 2024-01-31 DIAGNOSIS — E785 Hyperlipidemia, unspecified: Secondary | ICD-10-CM | POA: Diagnosis not present

## 2024-01-31 DIAGNOSIS — E039 Hypothyroidism, unspecified: Secondary | ICD-10-CM | POA: Diagnosis not present

## 2024-01-31 DIAGNOSIS — H3530 Unspecified macular degeneration: Secondary | ICD-10-CM | POA: Diagnosis not present

## 2024-01-31 DIAGNOSIS — S72011A Unspecified intracapsular fracture of right femur, initial encounter for closed fracture: Secondary | ICD-10-CM | POA: Diagnosis not present

## 2024-01-31 DIAGNOSIS — Z91018 Allergy to other foods: Secondary | ICD-10-CM | POA: Diagnosis not present

## 2024-01-31 DIAGNOSIS — M5431 Sciatica, right side: Secondary | ICD-10-CM | POA: Diagnosis not present

## 2024-01-31 DIAGNOSIS — Z743 Need for continuous supervision: Secondary | ICD-10-CM | POA: Diagnosis not present

## 2024-01-31 DIAGNOSIS — S72141A Displaced intertrochanteric fracture of right femur, initial encounter for closed fracture: Secondary | ICD-10-CM | POA: Diagnosis not present

## 2024-01-31 DIAGNOSIS — W010XXA Fall on same level from slipping, tripping and stumbling without subsequent striking against object, initial encounter: Secondary | ICD-10-CM | POA: Diagnosis not present

## 2024-01-31 DIAGNOSIS — Z800 Family history of malignant neoplasm of digestive organs: Secondary | ICD-10-CM | POA: Diagnosis not present

## 2024-01-31 DIAGNOSIS — Z01810 Encounter for preprocedural cardiovascular examination: Secondary | ICD-10-CM | POA: Diagnosis not present

## 2024-01-31 DIAGNOSIS — R2681 Unsteadiness on feet: Secondary | ICD-10-CM | POA: Diagnosis not present

## 2024-01-31 DIAGNOSIS — Z86718 Personal history of other venous thrombosis and embolism: Secondary | ICD-10-CM | POA: Diagnosis not present

## 2024-01-31 DIAGNOSIS — R1310 Dysphagia, unspecified: Secondary | ICD-10-CM | POA: Diagnosis not present

## 2024-01-31 DIAGNOSIS — G8918 Other acute postprocedural pain: Secondary | ICD-10-CM | POA: Diagnosis not present

## 2024-01-31 DIAGNOSIS — R7989 Other specified abnormal findings of blood chemistry: Secondary | ICD-10-CM

## 2024-01-31 DIAGNOSIS — Y92009 Unspecified place in unspecified non-institutional (private) residence as the place of occurrence of the external cause: Secondary | ICD-10-CM

## 2024-01-31 DIAGNOSIS — Z860101 Personal history of adenomatous and serrated colon polyps: Secondary | ICD-10-CM | POA: Diagnosis not present

## 2024-01-31 DIAGNOSIS — Z83719 Family history of colon polyps, unspecified: Secondary | ICD-10-CM | POA: Diagnosis not present

## 2024-01-31 DIAGNOSIS — K219 Gastro-esophageal reflux disease without esophagitis: Secondary | ICD-10-CM | POA: Diagnosis not present

## 2024-01-31 DIAGNOSIS — Z823 Family history of stroke: Secondary | ICD-10-CM | POA: Diagnosis not present

## 2024-01-31 DIAGNOSIS — W19XXXA Unspecified fall, initial encounter: Secondary | ICD-10-CM | POA: Diagnosis not present

## 2024-01-31 DIAGNOSIS — Z961 Presence of intraocular lens: Secondary | ICD-10-CM | POA: Diagnosis not present

## 2024-01-31 DIAGNOSIS — G250 Essential tremor: Secondary | ICD-10-CM

## 2024-01-31 DIAGNOSIS — S72009A Fracture of unspecified part of neck of unspecified femur, initial encounter for closed fracture: Secondary | ICD-10-CM | POA: Diagnosis not present

## 2024-01-31 DIAGNOSIS — K2950 Unspecified chronic gastritis without bleeding: Secondary | ICD-10-CM | POA: Diagnosis not present

## 2024-01-31 DIAGNOSIS — Z7982 Long term (current) use of aspirin: Secondary | ICD-10-CM | POA: Diagnosis not present

## 2024-01-31 DIAGNOSIS — Z9181 History of falling: Secondary | ICD-10-CM | POA: Diagnosis not present

## 2024-01-31 DIAGNOSIS — R2689 Other abnormalities of gait and mobility: Secondary | ICD-10-CM | POA: Diagnosis not present

## 2024-01-31 DIAGNOSIS — M069 Rheumatoid arthritis, unspecified: Secondary | ICD-10-CM | POA: Diagnosis not present

## 2024-01-31 DIAGNOSIS — Z8249 Family history of ischemic heart disease and other diseases of the circulatory system: Secondary | ICD-10-CM | POA: Diagnosis not present

## 2024-01-31 DIAGNOSIS — N179 Acute kidney failure, unspecified: Secondary | ICD-10-CM | POA: Diagnosis not present

## 2024-01-31 DIAGNOSIS — M1990 Unspecified osteoarthritis, unspecified site: Secondary | ICD-10-CM | POA: Diagnosis not present

## 2024-01-31 DIAGNOSIS — Z9841 Cataract extraction status, right eye: Secondary | ICD-10-CM | POA: Diagnosis not present

## 2024-01-31 DIAGNOSIS — Z79890 Hormone replacement therapy (postmenopausal): Secondary | ICD-10-CM | POA: Diagnosis not present

## 2024-01-31 DIAGNOSIS — I4891 Unspecified atrial fibrillation: Secondary | ICD-10-CM | POA: Diagnosis not present

## 2024-01-31 DIAGNOSIS — I498 Other specified cardiac arrhythmias: Secondary | ICD-10-CM | POA: Diagnosis not present

## 2024-01-31 DIAGNOSIS — Z885 Allergy status to narcotic agent status: Secondary | ICD-10-CM | POA: Diagnosis not present

## 2024-01-31 DIAGNOSIS — M0680 Other specified rheumatoid arthritis, unspecified site: Secondary | ICD-10-CM | POA: Diagnosis not present

## 2024-01-31 DIAGNOSIS — S72141D Displaced intertrochanteric fracture of right femur, subsequent encounter for closed fracture with routine healing: Secondary | ICD-10-CM | POA: Diagnosis not present

## 2024-01-31 LAB — CBC
HCT: 31.8 % — ABNORMAL LOW (ref 36.0–46.0)
Hemoglobin: 10.8 g/dL — ABNORMAL LOW (ref 12.0–15.0)
MCH: 33.8 pg (ref 26.0–34.0)
MCHC: 34 g/dL (ref 30.0–36.0)
MCV: 99.4 fL (ref 80.0–100.0)
Platelets: 189 10*3/uL (ref 150–400)
RBC: 3.2 MIL/uL — ABNORMAL LOW (ref 3.87–5.11)
RDW: 13.8 % (ref 11.5–15.5)
WBC: 7.4 10*3/uL (ref 4.0–10.5)
nRBC: 0 % (ref 0.0–0.2)

## 2024-01-31 LAB — COMPREHENSIVE METABOLIC PANEL WITH GFR
ALT: 18 U/L (ref 0–44)
AST: 24 U/L (ref 15–41)
Albumin: 3 g/dL — ABNORMAL LOW (ref 3.5–5.0)
Alkaline Phosphatase: 58 U/L (ref 38–126)
Anion gap: 9 (ref 5–15)
BUN: 22 mg/dL (ref 8–23)
CO2: 18 mmol/L — ABNORMAL LOW (ref 22–32)
Calcium: 8.8 mg/dL — ABNORMAL LOW (ref 8.9–10.3)
Chloride: 108 mmol/L (ref 98–111)
Creatinine, Ser: 0.84 mg/dL (ref 0.44–1.00)
GFR, Estimated: >60 mL/min (ref >60)
Glucose, Bld: 119 mg/dL — ABNORMAL HIGH (ref 70–99)
Potassium: 3.1 mmol/L — ABNORMAL LOW (ref 3.5–5.1)
Sodium: 135 mmol/L (ref 135–145)
Total Bilirubin: 1.2 mg/dL (ref 0.0–1.2)
Total Protein: 5.6 g/dL — ABNORMAL LOW (ref 6.5–8.1)

## 2024-01-31 LAB — SURGICAL PCR SCREEN
MRSA, PCR: NEGATIVE
Staphylococcus aureus: POSITIVE — AB

## 2024-01-31 LAB — TROPONIN I (HIGH SENSITIVITY)
Troponin I (High Sensitivity): 190 ng/L (ref <18)
Troponin I (High Sensitivity): 151 ng/L (ref <18)

## 2024-01-31 LAB — CK: Total CK: 121 U/L (ref 38–234)

## 2024-01-31 MED ORDER — MUPIROCIN 2 % EX OINT
1.0000 | TOPICAL_OINTMENT | Freq: Two times a day (BID) | CUTANEOUS | Status: AC
  Administered 2024-01-31 – 2024-02-04 (×9): 1 via NASAL
  Filled 2024-01-31 (×2): qty 22

## 2024-01-31 MED ORDER — SODIUM CHLORIDE 0.9 % IV SOLN: INTRAVENOUS | Status: DC

## 2024-01-31 MED ORDER — FOLIC ACID 1 MG PO TABS
2.0000 mg | ORAL_TABLET | Freq: Every day | ORAL | Status: DC
  Administered 2024-01-31 – 2024-02-06 (×7): 2 mg via ORAL
  Filled 2024-01-31 (×7): qty 2

## 2024-01-31 MED ORDER — MORPHINE SULFATE (PF) 2 MG/ML IV SOLN: 2.0000 mg | INTRAVENOUS | Status: DC | PRN

## 2024-01-31 MED ORDER — PANTOPRAZOLE SODIUM 40 MG PO TBEC
40.0000 mg | DELAYED_RELEASE_TABLET | Freq: Every day | ORAL | Status: DC
  Administered 2024-02-01 – 2024-02-05 (×5): 40 mg via ORAL
  Filled 2024-01-31 (×7): qty 1

## 2024-01-31 MED ORDER — OXYCODONE HCL ER 10 MG PO T12A
10.0000 mg | EXTENDED_RELEASE_TABLET | Freq: Two times a day (BID) | ORAL | Status: AC
  Filled 2024-01-31: qty 1

## 2024-01-31 MED ORDER — ALBUTEROL SULFATE HFA 108 (90 BASE) MCG/ACT IN AERS: 2.0000 | INHALATION_SPRAY | RESPIRATORY_TRACT | Status: DC | PRN

## 2024-01-31 MED ORDER — FENTANYL CITRATE PF 50 MCG/ML IJ SOSY
50.0000 ug | PREFILLED_SYRINGE | Freq: Once | INTRAMUSCULAR | Status: AC
  Administered 2024-01-31: 50 ug via INTRAVENOUS
  Filled 2024-01-31: qty 1

## 2024-01-31 MED ORDER — HYDROCHLOROTHIAZIDE 25 MG PO TABS
25.0000 mg | ORAL_TABLET | Freq: Every day | ORAL | Status: DC
  Administered 2024-01-31: 25 mg via ORAL
  Filled 2024-01-31: qty 1

## 2024-01-31 MED ORDER — LEVOTHYROXINE SODIUM 50 MCG PO TABS
50.0000 ug | ORAL_TABLET | Freq: Every day | ORAL | Status: DC
  Administered 2024-01-31 – 2024-02-06 (×7): 50 ug via ORAL
  Filled 2024-01-31 (×2): qty 1
  Filled 2024-01-31: qty 2
  Filled 2024-01-31 (×5): qty 1

## 2024-01-31 MED ORDER — SENNOSIDES-DOCUSATE SODIUM 8.6-50 MG PO TABS
2.0000 | ORAL_TABLET | Freq: Two times a day (BID) | ORAL | Status: DC
  Administered 2024-01-31 – 2024-02-06 (×6): 2 via ORAL
  Filled 2024-01-31 (×9): qty 2

## 2024-01-31 MED ORDER — TOPIRAMATE 100 MG PO TABS
100.0000 mg | ORAL_TABLET | Freq: Two times a day (BID) | ORAL | Status: DC
  Administered 2024-01-31 – 2024-02-06 (×12): 100 mg via ORAL
  Filled 2024-01-31 (×14): qty 1

## 2024-01-31 MED ORDER — EZETIMIBE 10 MG PO TABS
10.0000 mg | ORAL_TABLET | Freq: Every day | ORAL | Status: DC
  Administered 2024-01-31 – 2024-02-06 (×7): 10 mg via ORAL
  Filled 2024-01-31 (×8): qty 1

## 2024-01-31 MED ORDER — ROPIVACAINE HCL 5 MG/ML IJ SOLN
INTRAMUSCULAR | Status: DC | PRN
  Administered 2024-01-31: 30 mL

## 2024-01-31 MED ORDER — ASPIRIN 81 MG PO CHEW
81.0000 mg | CHEWABLE_TABLET | ORAL | Status: DC
  Administered 2024-01-31: 81 mg via ORAL
  Filled 2024-01-31: qty 1

## 2024-01-31 MED ORDER — FENTANYL CITRATE (PF) 100 MCG/2ML IJ SOLN
INTRAMUSCULAR | Status: AC
  Administered 2024-01-31: 50 ug
  Filled 2024-01-31: qty 2

## 2024-01-31 MED ORDER — METOPROLOL TARTRATE 50 MG PO TABS
50.0000 mg | ORAL_TABLET | Freq: Two times a day (BID) | ORAL | Status: DC
Start: 2024-01-31 — End: 2024-02-06
  Administered 2024-01-31 – 2024-02-06 (×11): 50 mg via ORAL
  Filled 2024-01-31 (×13): qty 1

## 2024-01-31 MED ORDER — LISINOPRIL 20 MG PO TABS
20.0000 mg | ORAL_TABLET | Freq: Every day | ORAL | Status: DC
  Administered 2024-01-31: 20 mg via ORAL
  Filled 2024-01-31: qty 1

## 2024-01-31 MED ORDER — ACETAMINOPHEN 325 MG PO TABS
650.0000 mg | ORAL_TABLET | Freq: Once | ORAL | Status: AC
  Administered 2024-01-31: 650 mg via ORAL
  Filled 2024-01-31: qty 2

## 2024-01-31 MED ORDER — POTASSIUM CHLORIDE 20 MEQ PO PACK
40.0000 meq | PACK | Freq: Once | ORAL | Status: AC
  Administered 2024-01-31: 40 meq via ORAL
  Filled 2024-01-31: qty 2

## 2024-01-31 MED ORDER — ENOXAPARIN SODIUM 40 MG/0.4ML IJ SOSY
40.0000 mg | PREFILLED_SYRINGE | INTRAMUSCULAR | Status: DC
  Administered 2024-02-02: 40 mg via SUBCUTANEOUS
  Filled 2024-01-31: qty 0.4

## 2024-01-31 MED ORDER — CHLORHEXIDINE GLUCONATE CLOTH 2 % EX PADS
6.0000 | MEDICATED_PAD | Freq: Every day | CUTANEOUS | Status: DC
  Administered 2024-02-01 – 2024-02-02 (×2): 6 via TOPICAL

## 2024-01-31 MED ORDER — OXYCODONE HCL 5 MG PO TABS
5.0000 mg | ORAL_TABLET | ORAL | Status: DC | PRN
  Administered 2024-02-02: 5 mg via ORAL
  Filled 2024-01-31: qty 1

## 2024-01-31 MED ORDER — LISINOPRIL-HYDROCHLOROTHIAZIDE 20-25 MG PO TABS: 1.0000 | ORAL_TABLET | Freq: Every day | ORAL | Status: DC

## 2024-01-31 MED ORDER — METHOTREXATE 2.5 MG PO TABS
17.5000 mg | ORAL_TABLET | ORAL | Status: DC
  Administered 2024-01-31: 17.5 mg via ORAL
  Filled 2024-01-31 (×2): qty 7

## 2024-01-31 MED ORDER — CELECOXIB 200 MG PO CAPS
200.0000 mg | ORAL_CAPSULE | Freq: Two times a day (BID) | ORAL | Status: DC
  Administered 2024-01-31 – 2024-02-01 (×2): 200 mg via ORAL
  Filled 2024-01-31 (×3): qty 1

## 2024-01-31 MED ORDER — POLYETHYLENE GLYCOL 3350 17 G PO PACK
17.0000 g | PACK | Freq: Every day | ORAL | Status: DC | PRN
  Administered 2024-02-02: 17 g via ORAL
  Filled 2024-01-31: qty 1

## 2024-01-31 MED ORDER — ACETAMINOPHEN 325 MG PO TABS
650.0000 mg | ORAL_TABLET | ORAL | Status: DC
  Administered 2024-01-31 (×2): 650 mg via ORAL
  Filled 2024-01-31 (×3): qty 2

## 2024-01-31 NOTE — Anesthesia Procedure Notes (Signed)
 Anesthesia Regional Block: Peng block   Pre-Anesthetic Checklist: , timeout performed,  Correct Patient, Correct Site, Correct Laterality,  Correct Procedure, Correct Position, site marked,  Risks and benefits discussed,  Surgical consent,  Pre-op evaluation,  At surgeon's request and post-op pain management  Laterality: Right  Prep: chloraprep       Needles:  Injection technique: Single-shot  Needle Type: Echogenic Needle     Needle Length: 9cm  Needle Gauge: 21     Additional Needles:   Procedures:,,,, ultrasound used (permanent image in chart),,    Narrative:  Start time: 01/31/2024 3:25 PM End time: 01/31/2024 3:41 PM Injection made incrementally with aspirations every 5 mL.  Performed by: Personally  Anesthesiologist: Jonne Netters, MD  Additional Notes: Pt identified in Holding room.  Monitors applied. Working IV access confirmed. Timeout, Sterile prep R ant iliac crest and inguinal crease.  #21ga ECHOgenic Arrow block needle to PENG with US  guidance.  30cc 0.5% Ropivacaine injected incrementally after negative test dose.  Patient asymptomatic, VSS, no heme aspirated, tolerated well.   Fay Hoop, MD

## 2024-01-31 NOTE — Anesthesia Preprocedure Evaluation (Addendum)
 Anesthesia Evaluation  Patient identified by MRN, date of birth, ID band Patient awake    Reviewed: Allergy & Precautions, NPO status , Patient's Chart, lab work & pertinent test results, reviewed documented beta blocker date and time   History of Anesthesia Complications Negative for: history of anesthetic complications  Airway Mallampati: II  TM Distance: >3 FB Neck ROM: Full    Dental  (+) Dental Advisory Given   Pulmonary COPD,  COPD inhaler   breath sounds clear to auscultation       Cardiovascular hypertension, Pt. on medications and Pt. on home beta blockers (-) angina + dysrhythmias (2012) Atrial Fibrillation  Rhythm:Regular Rate:Normal  '18 ECHO:  - Left ventricle: The cavity size was normal. Wall thickness was    normal. Systolic function was normal. EF 55% to 60%. Wall motion was normal;    there were no regional wall motion abnormalities. (grade 1 diastolic dysfunction).  - Aortic valve: There was no stenosis. There was mild    regurgitation.  - Mitral valve: Mildly calcified annulus. There was trivial    regurgitation.  - Right ventricle: The cavity size was normal. Systolic function    was normal.     Neuro/Psych  Headaches  Anxiety Depression       GI/Hepatic Neg liver ROS,GERD  Medicated and Controlled,,  Endo/Other  Hypothyroidism    Renal/GU negative Renal ROS     Musculoskeletal  (+) Arthritis , Rheumatoid disorders,    Abdominal   Peds  Hematology  (+) Blood dyscrasia (Hb 10.8, plt 189k), anemia   Anesthesia Other Findings   Reproductive/Obstetrics                             Anesthesia Physical Anesthesia Plan  ASA: 3  Anesthesia Plan: Regional   Post-op Pain Management:    Induction:   PONV Risk Score and Plan: Treatment may vary due to age or medical condition  Airway Management Planned: Natural Airway  Additional Equipment:   Intra-op Plan:    Post-operative Plan:   Informed Consent: I have reviewed the patients History and Physical, chart, labs and discussed the procedure including the risks, benefits and alternatives for the proposed anesthesia with the patient or authorized representative who has indicated his/her understanding and acceptance.       Plan Discussed with:   Anesthesia Plan Comments: (Plan PENG block for analgesia)        Anesthesia Quick Evaluation

## 2024-01-31 NOTE — Assessment & Plan Note (Signed)
 C.w. metoprolol . Hold lisinorpil and hydrochlorothiazide as patient DBP below 60 at this time. Likely due to pain meds.

## 2024-01-31 NOTE — Assessment & Plan Note (Signed)
 C/w topamax

## 2024-01-31 NOTE — Assessment & Plan Note (Addendum)
 Not in flare. Cw.. methotrexate. Folic acid. Check LFTs C/w pantop for GI ppx.
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
## 2024-01-31 NOTE — Assessment & Plan Note (Signed)
 C/w synthroid

## 2024-01-31 NOTE — Progress Notes (Signed)
 Bladder scan 975ml+. Will place foley for now.

## 2024-01-31 NOTE — Progress Notes (Signed)
 Charge RN received critical value of Troponin of 190. MD was notified, new orders ordered including Troponin check. The pt is currently going to get nerve block, procedural RN was notified of the critical value, foley will be inserted too in the procedural area.  01/31/24 1415  Provider Notification  Provider Name/Title Cleda Curly  Date Provider Notified 01/31/24  Time Provider Notified 1410  Method of Notification Page (secure chat)  Notification Reason Critical Result (Trop 190)  Test performed and critical result Trop 190  Date Critical Result Received 01/31/24  Time Critical Result Received 1411  Provider response See new orders  Date of Provider Response 01/31/24  Time of Provider Response 1414
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
## 2024-01-31 NOTE — Assessment & Plan Note (Signed)
 Patinet does not recall manner of fall. Denies head trauama. Will maintain on cardiac monitoring and check troponin. No focal weakness on exam.

## 2024-01-31 NOTE — Assessment & Plan Note (Addendum)
 Appreciate ortho eval. Plan fixation tomorrow with Dr. Rozelle Corning. Will check baseline EKG and CXR.   Pain - standing oxycontin, acetaminophen and celebrex ordered. Morphine as needed. Senna docusate  DVT ppx with lovenox.
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
## 2024-02-01 ENCOUNTER — Encounter (HOSPITAL_COMMUNITY)
Admission: EM | Disposition: A | Discharge status: Skilled Nursing Facility | Payer: Self-pay | Source: Home / Self Care | Attending: Internal Medicine

## 2024-02-01 ENCOUNTER — Inpatient Hospital Stay (HOSPITAL_COMMUNITY)

## 2024-02-01 ENCOUNTER — Encounter (HOSPITAL_COMMUNITY): Payer: Self-pay | Admitting: Internal Medicine

## 2024-02-01 ENCOUNTER — Other Ambulatory Visit: Payer: Self-pay

## 2024-02-01 DIAGNOSIS — Z01810 Encounter for preprocedural cardiovascular examination: Secondary | ICD-10-CM | POA: Diagnosis not present

## 2024-02-01 DIAGNOSIS — S72001A Fracture of unspecified part of neck of right femur, initial encounter for closed fracture: Secondary | ICD-10-CM

## 2024-02-01 DIAGNOSIS — S72141A Displaced intertrochanteric fracture of right femur, initial encounter for closed fracture: Secondary | ICD-10-CM

## 2024-02-01 DIAGNOSIS — R7989 Other specified abnormal findings of blood chemistry: Secondary | ICD-10-CM

## 2024-02-01 HISTORY — PX: INTRAMEDULLARY (IM) NAIL INTERTROCHANTERIC: SHX5875

## 2024-02-01 LAB — PROTIME-INR
INR: 1.7 — ABNORMAL HIGH (ref 0.8–1.2)
Prothrombin Time: 20.2 s — ABNORMAL HIGH (ref 11.4–15.2)

## 2024-02-01 LAB — ECHOCARDIOGRAM COMPLETE
Area-P 1/2: 2.69 cm2
Height: 60 in
S' Lateral: 3 cm
Weight: 1888 [oz_av]

## 2024-02-01 LAB — CBC
HCT: 31.3 % — ABNORMAL LOW (ref 36.0–46.0)
Hemoglobin: 11.1 g/dL — ABNORMAL LOW (ref 12.0–15.0)
MCH: 34.8 pg — ABNORMAL HIGH (ref 26.0–34.0)
MCHC: 35.5 g/dL (ref 30.0–36.0)
MCV: 98.1 fL (ref 80.0–100.0)
Platelets: 169 10*3/uL (ref 150–400)
RBC: 3.19 MIL/uL — ABNORMAL LOW (ref 3.87–5.11)
RDW: 14 % (ref 11.5–15.5)
WBC: 6.8 10*3/uL (ref 4.0–10.5)
nRBC: 0 % (ref 0.0–0.2)

## 2024-02-01 LAB — BASIC METABOLIC PANEL WITH GFR
Anion gap: 8 (ref 5–15)
BUN: 27 mg/dL — ABNORMAL HIGH (ref 8–23)
CO2: 20 mmol/L — ABNORMAL LOW (ref 22–32)
Calcium: 8.9 mg/dL (ref 8.9–10.3)
Chloride: 108 mmol/L (ref 98–111)
Creatinine, Ser: 0.85 mg/dL (ref 0.44–1.00)
GFR, Estimated: >60 mL/min (ref >60)
Glucose, Bld: 100 mg/dL — ABNORMAL HIGH (ref 70–99)
Potassium: 3.8 mmol/L (ref 3.5–5.1)
Sodium: 136 mmol/L (ref 135–145)

## 2024-02-01 LAB — APTT: aPTT: 41 s — ABNORMAL HIGH (ref 24–36)

## 2024-02-01 SURGERY — FIXATION, FRACTURE, INTERTROCHANTERIC, WITH INTRAMEDULLARY ROD
Anesthesia: General | Site: Hip | Laterality: Right

## 2024-02-01 MED ORDER — DROPERIDOL 2.5 MG/ML IJ SOLN: 0.6250 mg | Freq: Once | INTRAMUSCULAR | Status: DC | PRN

## 2024-02-01 MED ORDER — PHENYLEPHRINE HCL-NACL 20-0.9 MG/250ML-% IV SOLN
INTRAVENOUS | Status: DC | PRN
  Administered 2024-02-01: 35 ug/min via INTRAVENOUS

## 2024-02-01 MED ORDER — PHENOL 1.4 % MT LIQD: 1.0000 | OROMUCOSAL | Status: DC | PRN

## 2024-02-01 MED ORDER — MORPHINE SULFATE (PF) 4 MG/ML IV SOLN
INTRAVENOUS | Status: DC | PRN
  Administered 2024-02-01: 34.5 mL

## 2024-02-01 MED ORDER — LIDOCAINE 2% (20 MG/ML) 5 ML SYRINGE
INTRAMUSCULAR | Status: DC | PRN
  Administered 2024-02-01: 60 mg via INTRAVENOUS

## 2024-02-01 MED ORDER — FENTANYL CITRATE (PF) 250 MCG/5ML IJ SOLN
INTRAMUSCULAR | Status: DC | PRN
  Administered 2024-02-01: 50 ug via INTRAVENOUS
  Administered 2024-02-01: 100 ug via INTRAVENOUS

## 2024-02-01 MED ORDER — ACETAMINOPHEN 500 MG PO TABS
1000.0000 mg | ORAL_TABLET | Freq: Four times a day (QID) | ORAL | Status: AC
  Administered 2024-02-01 (×2): 1000 mg via ORAL
  Filled 2024-02-01 (×5): qty 2

## 2024-02-01 MED ORDER — CLONIDINE HCL (ANALGESIA) 100 MCG/ML EP SOLN
EPIDURAL | Status: AC
  Filled 2024-02-01: qty 10

## 2024-02-01 MED ORDER — ACETAMINOPHEN 10 MG/ML IV SOLN
INTRAVENOUS | Status: DC | PRN
  Administered 2024-02-01: 1000 mg via INTRAVENOUS

## 2024-02-01 MED ORDER — TRANEXAMIC ACID 1000 MG/10ML IV SOLN
2000.0000 mg | Freq: Once | INTRAVENOUS | Status: DC
  Filled 2024-02-01: qty 20

## 2024-02-01 MED ORDER — ASPIRIN 81 MG PO TBEC
81.0000 mg | DELAYED_RELEASE_TABLET | Freq: Two times a day (BID) | ORAL | Status: DC
  Administered 2024-02-01 – 2024-02-06 (×10): 81 mg via ORAL
  Filled 2024-02-01 (×10): qty 1

## 2024-02-01 MED ORDER — METOCLOPRAMIDE HCL 5 MG PO TABS: 5.0000 mg | ORAL_TABLET | Freq: Three times a day (TID) | ORAL | Status: DC | PRN

## 2024-02-01 MED ORDER — ACETAMINOPHEN 325 MG PO TABS
325.0000 mg | ORAL_TABLET | Freq: Four times a day (QID) | ORAL | Status: DC | PRN
  Administered 2024-02-05: 650 mg via ORAL
  Filled 2024-02-01 (×2): qty 2

## 2024-02-01 MED ORDER — LACTATED RINGERS IV SOLN: INTRAVENOUS | Status: DC

## 2024-02-01 MED ORDER — SUGAMMADEX SODIUM 200 MG/2ML IV SOLN
INTRAVENOUS | Status: DC | PRN
  Administered 2024-02-01: 200 mg via INTRAVENOUS

## 2024-02-01 MED ORDER — TRANEXAMIC ACID-NACL 1000-0.7 MG/100ML-% IV SOLN
1000.0000 mg | INTRAVENOUS | Status: DC
  Filled 2024-02-01: qty 100

## 2024-02-01 MED ORDER — CHLORHEXIDINE GLUCONATE 0.12 % MT SOLN: 15.0000 mL | Freq: Once | OROMUCOSAL | Status: AC

## 2024-02-01 MED ORDER — PROPOFOL 10 MG/ML IV BOLUS
INTRAVENOUS | Status: AC
  Filled 2024-02-01: qty 20

## 2024-02-01 MED ORDER — ACETAMINOPHEN 10 MG/ML IV SOLN
INTRAVENOUS | Status: AC
  Filled 2024-02-01: qty 100

## 2024-02-01 MED ORDER — HYDROMORPHONE HCL 1 MG/ML IJ SOLN: 0.5000 mg | INTRAMUSCULAR | Status: DC | PRN

## 2024-02-01 MED ORDER — ONDANSETRON HCL 4 MG/2ML IJ SOLN
4.0000 mg | Freq: Four times a day (QID) | INTRAMUSCULAR | Status: DC | PRN
Start: 2024-02-01 — End: 2024-02-06

## 2024-02-01 MED ORDER — CEFAZOLIN SODIUM-DEXTROSE 2-4 GM/100ML-% IV SOLN
2.0000 g | Freq: Once | INTRAVENOUS | Status: AC
  Administered 2024-02-01: 2 g via INTRAVENOUS
  Filled 2024-02-01: qty 100

## 2024-02-01 MED ORDER — PROPOFOL 10 MG/ML IV BOLUS
INTRAVENOUS | Status: DC | PRN
Start: 2024-02-01 — End: 2024-02-01
  Administered 2024-02-01: 130 mg via INTRAVENOUS

## 2024-02-01 MED ORDER — ONDANSETRON HCL 4 MG/2ML IJ SOLN
INTRAMUSCULAR | Status: DC | PRN
  Administered 2024-02-01: 4 mg via INTRAVENOUS

## 2024-02-01 MED ORDER — MENTHOL 3 MG MT LOZG: 1.0000 | LOZENGE | OROMUCOSAL | Status: DC | PRN

## 2024-02-01 MED ORDER — ORAL CARE MOUTH RINSE: 15.0000 mL | Freq: Once | OROMUCOSAL | Status: AC

## 2024-02-01 MED ORDER — FENTANYL CITRATE (PF) 250 MCG/5ML IJ SOLN
INTRAMUSCULAR | Status: AC
  Filled 2024-02-01: qty 5

## 2024-02-01 MED ORDER — ROCURONIUM BROMIDE 10 MG/ML (PF) SYRINGE
PREFILLED_SYRINGE | INTRAVENOUS | Status: DC | PRN
  Administered 2024-02-01: 50 mg via INTRAVENOUS

## 2024-02-01 MED ORDER — METOCLOPRAMIDE HCL 5 MG/ML IJ SOLN: 5.0000 mg | Freq: Three times a day (TID) | INTRAMUSCULAR | Status: DC | PRN

## 2024-02-01 MED ORDER — ONDANSETRON HCL 4 MG PO TABS: 4.0000 mg | ORAL_TABLET | Freq: Four times a day (QID) | ORAL | Status: DC | PRN

## 2024-02-01 MED ORDER — METHOCARBAMOL 500 MG PO TABS: 500.0000 mg | ORAL_TABLET | Freq: Four times a day (QID) | ORAL | Status: DC | PRN

## 2024-02-01 MED ORDER — MORPHINE SULFATE (PF) 4 MG/ML IV SOLN
INTRAVENOUS | Status: AC
  Filled 2024-02-01: qty 1

## 2024-02-01 MED ORDER — DOCUSATE SODIUM 100 MG PO CAPS
100.0000 mg | ORAL_CAPSULE | Freq: Two times a day (BID) | ORAL | Status: DC
  Administered 2024-02-01 – 2024-02-06 (×6): 100 mg via ORAL
  Filled 2024-02-01 (×9): qty 1

## 2024-02-01 MED ORDER — MIDAZOLAM HCL 2 MG/2ML IJ SOLN
INTRAMUSCULAR | Status: AC
  Filled 2024-02-01: qty 2

## 2024-02-01 MED ORDER — BUPIVACAINE HCL (PF) 0.25 % IJ SOLN
INTRAMUSCULAR | Status: AC
  Filled 2024-02-01: qty 30

## 2024-02-01 MED ORDER — PHENYLEPHRINE 80 MCG/ML (10ML) SYRINGE FOR IV PUSH (FOR BLOOD PRESSURE SUPPORT)
PREFILLED_SYRINGE | INTRAVENOUS | Status: DC | PRN
  Administered 2024-02-01 (×2): 80 ug via INTRAVENOUS

## 2024-02-01 MED ORDER — FENTANYL CITRATE (PF) 100 MCG/2ML IJ SOLN: 25.0000 ug | INTRAMUSCULAR | Status: DC | PRN

## 2024-02-01 MED ORDER — 0.9 % SODIUM CHLORIDE (POUR BTL) OPTIME
TOPICAL | Status: DC | PRN
  Administered 2024-02-01: 1000 mL

## 2024-02-01 MED ORDER — CEFAZOLIN SODIUM-DEXTROSE 2-4 GM/100ML-% IV SOLN
2.0000 g | Freq: Three times a day (TID) | INTRAVENOUS | Status: AC
  Administered 2024-02-01 (×2): 2 g via INTRAVENOUS
  Filled 2024-02-01 (×2): qty 100

## 2024-02-01 MED ORDER — EPHEDRINE SULFATE-NACL 50-0.9 MG/10ML-% IV SOSY
PREFILLED_SYRINGE | INTRAVENOUS | Status: DC | PRN
Start: 2024-02-01 — End: 2024-02-01
  Administered 2024-02-01: 5 mg via INTRAVENOUS

## 2024-02-01 MED ORDER — CHLORHEXIDINE GLUCONATE 0.12 % MT SOLN
OROMUCOSAL | Status: AC
  Administered 2024-02-01: 15 mL via OROMUCOSAL
  Filled 2024-02-01: qty 15

## 2024-02-01 MED ORDER — METHOCARBAMOL 1000 MG/10ML IJ SOLN: 500.0000 mg | Freq: Four times a day (QID) | INTRAMUSCULAR | Status: DC | PRN

## 2024-02-01 SURGICAL SUPPLY — 49 items
BAG COUNTER SPONGE SURGICOUNT (BAG) ×1 IMPLANT
BIT DRILL INTERTAN LAG SCREW (BIT) IMPLANT
BLADE CLIPPER SURG (BLADE) IMPLANT
BLADE SURG 15 STRL LF DISP TIS (BLADE) ×1 IMPLANT
COVER PERINEAL POST (MISCELLANEOUS) ×1 IMPLANT
COVER SURGICAL LIGHT HANDLE (MISCELLANEOUS) ×1 IMPLANT
DRAPE HALF SHEET 40X57 (DRAPES) IMPLANT
DRAPE STERI IOBAN 125X83 (DRAPES) ×1 IMPLANT
DRAPE SURG ORHT 6 SPLT 77X108 (DRAPES) IMPLANT
DRESSING MEPILEX FLEX 4X4 (GAUZE/BANDAGES/DRESSINGS) IMPLANT
DRSG AQUACEL AG ADV 3.5X 6 (GAUZE/BANDAGES/DRESSINGS) IMPLANT
DRSG MEPILEX FLEX 4X4 (GAUZE/BANDAGES/DRESSINGS) IMPLANT
DRSG MEPILEX POST OP 4X8 (GAUZE/BANDAGES/DRESSINGS) ×1 IMPLANT
DURAPREP 26ML APPLICATOR (WOUND CARE) ×1 IMPLANT
ELECT REM PT RETURN 9FT ADLT (ELECTROSURGICAL) ×1 IMPLANT
ELECTRODE REM PT RTRN 9FT ADLT (ELECTROSURGICAL) ×1 IMPLANT
FACESHIELD WRAPAROUND (MASK) IMPLANT
FACESHIELD WRAPAROUND OR TEAM (MASK) ×1 IMPLANT
GAUZE XEROFORM 5X9 LF (GAUZE/BANDAGES/DRESSINGS) ×1 IMPLANT
GLOVE BIOGEL PI IND STRL 8 (GLOVE) ×1 IMPLANT
GLOVE ECLIPSE 8.0 STRL XLNG CF (GLOVE) ×1 IMPLANT
GOWN STRL REUS W/ TWL LRG LVL3 (GOWN DISPOSABLE) ×1 IMPLANT
GOWN STRL REUS W/ TWL XL LVL3 (GOWN DISPOSABLE) IMPLANT
GUIDE PIN 3.2X343 (PIN) ×2 IMPLANT
KIT BASIN OR (CUSTOM PROCEDURE TRAY) ×1 IMPLANT
KIT TURNOVER KIT B (KITS) ×1 IMPLANT
MANIFOLD NEPTUNE II (INSTRUMENTS) ×1 IMPLANT
NAIL RIGHT 10X38MM (Nail) IMPLANT
NDL HYPO 21X1.5 SAFETY (NEEDLE) IMPLANT
NDL SPNL 22GX3.5 QUINCKE BK (NEEDLE) IMPLANT
NEEDLE HYPO 21X1.5 SAFETY (NEEDLE) ×1 IMPLANT
NEEDLE SPNL 22GX3.5 QUINCKE BK (NEEDLE) ×1 IMPLANT
NS IRRIG 1000ML POUR BTL (IV SOLUTION) ×1 IMPLANT
PACK GENERAL/GYN (CUSTOM PROCEDURE TRAY) ×1 IMPLANT
PAD ARMBOARD POSITIONER FOAM (MISCELLANEOUS) ×2 IMPLANT
PIN GUIDE 3.2X343MM (PIN) IMPLANT
SCREW LAG COMPR KIT 85/80 (Screw) IMPLANT
SPONGE T-LAP 4X18 ~~LOC~~+RFID (SPONGE) IMPLANT
STAPLER VISISTAT 35W (STAPLE) ×1 IMPLANT
SUT ETHILON 2 0 FS 18 (SUTURE) IMPLANT
SUT ETHILON 3 0 PS 1 (SUTURE) IMPLANT
SUT VIC AB 0 CT1 27XBRD ANTBC (SUTURE) IMPLANT
SUT VIC AB 1 CT1 27XBRD ANBCTR (SUTURE) IMPLANT
SUT VIC AB 2-0 CT2 27 (SUTURE) IMPLANT
SUT VIC AB 2-0 CTB1 (SUTURE) ×1 IMPLANT
TAPE STRIPS DRAPE STRL (GAUZE/BANDAGES/DRESSINGS) IMPLANT
TOWEL GREEN STERILE (TOWEL DISPOSABLE) ×1 IMPLANT
TOWEL GREEN STERILE FF (TOWEL DISPOSABLE) ×1 IMPLANT
WATER STERILE IRR 1000ML POUR (IV SOLUTION) ×1 IMPLANT

## 2024-02-01 NOTE — Progress Notes (Signed)
  Echocardiogram 2D Echocardiogram has been performed.  Fain Home RDCS 02/01/2024, 1:32 PM

## 2024-02-01 NOTE — Anesthesia Preprocedure Evaluation (Signed)
 Anesthesia Evaluation  Patient identified by MRN, date of birth, ID band Patient awake    Reviewed: Allergy & Precautions, NPO status , Patient's Chart, lab work & pertinent test results  Airway Mallampati: II  TM Distance: >3 FB Neck ROM: Full    Dental no notable dental hx.    Pulmonary neg pulmonary ROS   Pulmonary exam normal        Cardiovascular hypertension, Pt. on medications and Pt. on home beta blockers  Rhythm:Regular Rate:Normal     Neuro/Psych  Headaches  Anxiety Depression       GI/Hepatic Neg liver ROS,GERD  Medicated,,  Endo/Other  Hypothyroidism    Renal/GU negative Renal ROS  negative genitourinary   Musculoskeletal  (+) Arthritis , Osteoarthritis,    Abdominal Normal abdominal exam  (+)   Peds  Hematology  (+) Blood dyscrasia, anemia Lab Results      Component                Value               Date                      WBC                      7.4                 01/31/2024                HGB                      10.8 (L)            01/31/2024                HCT                      31.8 (L)            01/31/2024                MCV                      99.4                01/31/2024                PLT                      189                 01/31/2024              Anesthesia Other Findings   Reproductive/Obstetrics                             Anesthesia Physical Anesthesia Plan  ASA: 2  Anesthesia Plan: General   Post-op Pain Management:    Induction: Intravenous  PONV Risk Score and Plan: 3 and Ondansetron, Dexamethasone and Treatment may vary due to age or medical condition  Airway Management Planned: Mask and Oral ETT  Additional Equipment: None  Intra-op Plan:   Post-operative Plan: Extubation in OR  Informed Consent: I have reviewed the patients History and Physical, chart, labs and discussed the procedure including the risks, benefits and  alternatives for the proposed anesthesia with the patient  or authorized representative who has indicated his/her understanding and acceptance.     Dental advisory given  Plan Discussed with: CRNA  Anesthesia Plan Comments:        Anesthesia Quick Evaluation

## 2024-02-01 NOTE — Progress Notes (Signed)
 Daughter called an made aware pt going to surgery

## 2024-02-01 NOTE — Plan of Care (Signed)

## 2024-02-01 NOTE — Progress Notes (Signed)
 PROGRESS NOTE    [name]  MWU:[id number] DOB: Sep 09, 1946 DOA: 01/30/2024 PCP: Corwin Levins, MD   Brief Narrative:  [name] is a 78 y.o. female with medical history significant of rheumatoid arthritis, essential tremors.  Patient presents with right hip pain after mechanical fall, imaging confirms right proximal intertrochanteric femur fracture.  Hospitalist called for admission, orthopedics called in consult.  Assessment & Plan:   Principal Problem:   Hip fracture (HCC) Active Problems:   Fall at home, initial encounter   Essential tremor   Right intertrochanteric femur fracture, POA Fall at home, initial encounter -Unclear fall, patient denies loss of consciousness or syncope but cannot recall exact mechanism of fall -Pain currently well-controlled, Ortho following along, plan for ORIF later today per their schedule; Dr. August Saucer - PT OT to follow, disposition plan pending patient's amatory status -DVT prophylaxis and pain per orthopedics   Essential tremor, chronic Continue home topamax  Hypothyroidism C/w synthroid   Rheumatoid arthritis, chronic Stable, continue methotrexate, folic acid, LFTs within normal limits  HTN (hypertension) - Borderline hypotension at intake, likely complicated by narcotics - Continue metoprolol, discontinue lisinopril HCTZ in the interim  DVT prophylaxis: enoxaparin (LOVENOX) injection 40 mg Start: 02/01/24 0800 SCDs Start: 01/31/24 0950   Code Status:   Code Status: Full Code Family Communication: None present  Status is: Inpatient  Dispo: The patient is from: Home              Anticipated d/c is to: To be determined              Anticipated d/c date is: 40 to 72 hours              Patient currently not medically stable for discharge  Consultants:  Orthopedic surgery  Procedures:  ORIF right femur 02/01/2024  Antimicrobials:  Perioperatively  Subjective: No acute issues or events  overnight  Objective: Vitals:   01/31/24 1535 01/31/24 1540 01/31/24 2035 02/01/24 0640  BP: (!) 151/77 (!) 153/62 (!) 132/55 (!) 138/59  Pulse: 63 (!) 59 66 64  Resp: 14 16 16 18   Temp:   98.8 F (37.1 C) 98.6 F (37 C)  TempSrc:   Oral Oral  SpO2: 97% 99% 97% 96%  Weight:    53.5 kg  Height:    5' (1.524 m)    Intake/Output Summary (Last 24 hours) at 02/01/2024 0830 Last data filed at 02/01/2024 0749 Gross per 24 hour  Intake 700 ml  Output 1800 ml  Net -1100 ml   Filed Weights   01/31/24 0900 02/01/24 0640  Weight: 53.5 kg 53.5 kg    Examination:  General:  Pleasantly resting in bed, No acute distress. HEENT:  Normocephalic atraumatic.  Sclerae nonicteric, noninjected.  Extraocular movements intact bilaterally. Neck:  Without mass or deformity.  Trachea is midline. Lungs:  Clear to auscultate bilaterally without rhonchi, wheeze, or rales. Heart:  Regular rate and rhythm.  Without murmurs, rubs, or gallops. Abdomen:  Soft, nontender, nondistended.  Without guarding or rebound. Extremities: Without cyanosis, clubbing, edema  Data Reviewed: I have personally reviewed following labs and imaging studies  CBC: Recent Labs  Lab 01/30/24 1656 01/31/24 1219 02/01/24 0557  WBC 11.1* 7.4 6.8  HGB 12.1 10.8* 11.1*  HCT 34.0* 31.8* 31.3*  MCV 98.8 99.4 98.1  PLT 216 189 169   Basic Metabolic Panel: Recent Labs  Lab 01/30/24 1656 01/31/24 1219 02/01/24 0557  NA 136 135 136  K 3.4* 3.1* 3.8  CL 106 108 108  CO2 19* 18* 20*  GLUCOSE 104* 119* 100*  BUN 29* 22 27*  CREATININE 0.76 0.84 0.85  CALCIUM 9.1 8.8* 8.9   GFR: Estimated Creatinine Clearance: 39.2 mL/min (by C-G formula based on SCr of 0.85 mg/dL). Liver Function Tests: Recent Labs  Lab 01/31/24 1219  AST 24  ALT 18  ALKPHOS 58  BILITOT 1.2  PROT 5.6*  ALBUMIN 3.0*   No results for input(s): "LIPASE", "AMYLASE" in the last 168 hours. No results for input(s): "AMMONIA" in the last 168  hours. Coagulation Profile: Recent Labs  Lab 02/01/24 0557  INR 1.7*   Cardiac Enzymes: Recent Labs  Lab 01/31/24 1638  CKTOTAL 121   BNP (last 3 results) No results for input(s): "PROBNP" in the last 8760 hours. HbA1C: No results for input(s): "HGBA1C" in the last 72 hours. CBG: No results for input(s): "GLUCAP" in the last 168 hours. Lipid Profile: No results for input(s): "CHOL", "HDL", "LDLCALC", "TRIG", "CHOLHDL", "LDLDIRECT" in the last 72 hours. Thyroid Function Tests: No results for input(s): "TSH", "T4TOTAL", "FREET4", "T3FREE", "THYROIDAB" in the last 72 hours. Anemia Panel: No results for input(s): "VITAMINB12", "FOLATE", "FERRITIN", "TIBC", "IRON", "RETICCTPCT" in the last 72 hours. Sepsis Labs: No results for input(s): "PROCALCITON", "LATICACIDVEN" in the last 168 hours.  Recent Results (from the past 240 hours)  Surgical PCR screen     Status: Abnormal   Collection Time: 01/31/24 10:04 AM   Specimen: Nasal Mucosa; Nasal Swab  Result Value Ref Range Status   MRSA, PCR NEGATIVE NEGATIVE Final   Staphylococcus aureus POSITIVE (A) NEGATIVE Final    Comment: (NOTE) The Xpert SA Assay (FDA approved for NASAL specimens in patients 68 years of age and older), is one component of a comprehensive surveillance program. It is not intended to diagnose infection nor to guide or monitor treatment. Performed at [facility], [address] [place], [place] [address]          Radiology Studies: DG CHEST PORT 1 VIEW Result Date: 01/31/2024 CLINICAL DATA:  Fall.  Preoperative exam. EXAM: PORTABLE CHEST 1 VIEW COMPARISON:  Chest radiograph dated 07/27/2022. FINDINGS: The heart size and mediastinal contours are within normal limits. Aortic atherosclerosis. No focal consolidation, pleural effusion, or pneumothorax. No acute osseous abnormality identified. IMPRESSION: 1. No acute cardiopulmonary findings. 2.  Aortic Atherosclerosis (ICD10-I70.0). Electronically Signed    By: Mannie Seek M.D.   On: 01/31/2024 16:24   CT HIP RIGHT WO CONTRAST Result Date: 01/31/2024 CLINICAL DATA:  Right hip trauma after fall. EXAM: CT OF THE RIGHT HIP WITHOUT CONTRAST TECHNIQUE: Multidetector CT imaging of the right hip was performed according to the standard protocol. Multiplanar CT image reconstructions were also generated. RADIATION DOSE REDUCTION: This exam was performed according to the departmental dose-optimization program which includes automated exposure control, adjustment of the mA and/or kV according to patient size and/or use of iterative reconstruction technique. COMPARISON:  Right hip radiographs dated 01/30/2024. FINDINGS: Bones/Joint/Cartilage Oblique fracture of the right proximal femur extending proximally through the subcapital femoral head anteriorly with intertrochanteric extension distally and posteriorly. Fracture margins extend to the posterior facet of the greater trochanter and through the lesser trochanter. There is approximately 4 cm of impaction and 6 mm of posterior displacement at the level of the posterior facet of the greater trochanter. The right femoral head is seated within the acetabulum. The visualized right sacroiliac joint is intact. Ligaments Ligaments are suboptimally evaluated by CT. Soft tissue and Muscles No  discrete soft tissue and intramuscular fluid collection. IMPRESSION: Acute impacted and displaced oblique fracture of the right proximal femur involving the subcapital femoral head anteriorly with intertrochanteric extension. Electronically Signed   By: Mannie Seek M.D.   On: 01/31/2024 16:22   DG Hip Unilat  With Pelvis 2-3 Views Right Result Date: 01/30/2024 CLINICAL DATA:  Fall, right hip pain EXAM: DG HIP (WITH OR WITHOUT PELVIS) 2-3V RIGHT COMPARISON:  09/30/2016 FINDINGS: Right proximal femoral intertrochanteric fracture, with deformity observed and cortical discontinuity along the lesser trochanter favoring intertrochanteric  fracture over basicervical femoral neck fracture. No other fracture identified. Spurring and intervertebral space narrowing at L4-5 and L5-S1. IMPRESSION: 1. Right proximal femoral intertrochanteric fracture. 2. Degenerative disc disease at L4-5 and L5-S1. Electronically Signed   By: Freida Jes M.D.   On: 01/30/2024 18:45        Scheduled Meds:  [MAR Hold] acetaminophen  650 mg Oral Q4H   [MAR Hold] aspirin  81 mg Oral QODAY   [MAR Hold] celecoxib  200 mg Oral BID   [MAR Hold] Chlorhexidine Gluconate Cloth  6 each Topical Q0600   [MAR Hold] enoxaparin (LOVENOX) injection  40 mg Subcutaneous Q24H   [MAR Hold] ezetimibe  10 mg Oral Daily   [MAR Hold] folic acid  2 mg Oral Daily   [MAR Hold] levothyroxine  50 mcg Oral Daily   [MAR Hold] methotrexate  17.5 mg Oral Weekly   [MAR Hold] metoprolol tartrate  50 mg Oral BID   [MAR Hold] mupirocin ointment  1 Application Nasal BID   [MAR Hold] oxyCODONE  10 mg Oral Q12H   [MAR Hold] pantoprazole  40 mg Oral Daily   [MAR Hold] senna-docusate  2 tablet Oral BID   [MAR Hold] topiramate  100 mg Oral BID   tranexamic acid (CYKLOKAPRON) 2,000 mg in sodium chloride 0.9 % 50 mL Topical Application  2,000 mg Topical Once   Continuous Infusions:  lactated ringers     tranexamic acid      LOS: 1 day   Time spent:  Haydee Lipa, DO [facility]  If 7PM-7AM, please contact night-coverage [email or web address]  02/01/2024, 8:30 AM

## 2024-02-01 NOTE — Anesthesia Postprocedure Evaluation (Signed)
 Anesthesia Post Note  Patient: [name]  Procedure(s) Performed: FIXATION, FRACTURE, INTERTROCHANTERIC, WITH INTRAMEDULLARY ROD (Right: Hip)     Patient location during evaluation: PACU Anesthesia Type: General Level of consciousness: awake and alert Pain management: pain level controlled Vital Signs Assessment: post-procedure vital signs reviewed and stable Respiratory status: spontaneous breathing, nonlabored ventilation, respiratory function stable and patient connected to nasal cannula oxygen Cardiovascular status: blood pressure returned to baseline and stable Postop Assessment: no apparent nausea or vomiting Anesthetic complications: no   No notable events documented.  Last Vitals:  Vitals:   02/01/24 1004 02/01/24 1349  BP: (!) 113/59 (!) 120/54  Pulse: 61 90  Resp:    Temp: (!) 36.3 C 36.9 C  SpO2: 94% 92%    Last Pain:  Vitals:   02/01/24 1215  TempSrc:   PainSc: 0-No pain                 Theotis Flake P Delmar Dondero

## 2024-02-01 NOTE — Transfer of Care (Signed)
 Immediate Anesthesia Transfer of Care Note  Patient: [name]  Procedure(s) Performed: FIXATION, FRACTURE, INTERTROCHANTERIC, WITH INTRAMEDULLARY ROD (Right: Hip)  Patient Location: PACU  Anesthesia Type:General  Level of Consciousness: awake and alert   Airway & Oxygen Therapy: Patient Spontanous Breathing  Post-op Assessment: Report given to RN  Post vital signs: Reviewed and stable  Last Vitals:  Vitals Value Taken Time  BP 133/56 02/01/24 0908  Temp    Pulse 69 02/01/24 0911  Resp 13 02/01/24 0911  SpO2 96 % 02/01/24 0911  Vitals shown include unfiled device data.  Last Pain:  Vitals:   02/01/24 0640  TempSrc: Oral  PainSc:          Complications: No notable events documented.

## 2024-02-01 NOTE — Anesthesia Procedure Notes (Signed)
 Procedure Name: Intubation Date/Time: 02/01/2024 7:48 AM  Performed by: Hershall Lory, CRNAPre-anesthesia Checklist: Patient identified, Emergency Drugs available, Suction available and Patient being monitored Patient Re-evaluated:Patient Re-evaluated prior to induction Oxygen Delivery Method: Circle system utilized Preoxygenation: Pre-oxygenation with 100% oxygen Induction Type: IV induction Ventilation: Mask ventilation without difficulty Laryngoscope Size: Mac and 3 Grade View: Grade II Tube type: Oral Tube size: 7.0 mm Number of attempts: 1 Airway Equipment and Method: Stylet Placement Confirmation: ETT inserted through vocal cords under direct vision, positive ETCO2 and breath sounds checked- equal and bilateral Tube secured with: Tape Dental Injury: Teeth and Oropharynx as per pre-operative assessment

## 2024-02-01 NOTE — Brief Op Note (Signed)
   02/01/2024  9:38 AM  PATIENT:  Patt Boozer  78 y.o. female  PRE-OPERATIVE DIAGNOSIS:  closed fracture of right hip  POST-OPERATIVE DIAGNOSIS:  closed fracture of right hip  PROCEDURE:  Procedure(s): FIXATION, FRACTURE, INTERTROCHANTERIC, WITH INTRAMEDULLARY ROD  SURGEON:  Surgeon(s): Rozelle Corning, Maricela Shoe, MD  ASSISTANT: magnant pa  ANESTHESIA:   general  EBL: 25 ml    Total I/O In: 600 [I.V.:500; IV Piggyback:100] Out: 75 [Urine:50; Blood:25]  BLOOD ADMINISTERED: none  DRAINS: none   LOCAL MEDICATIONS USED:  marcaine morphine clonidine  SPECIMEN:  No Specimen  COUNTS:  YES  TOURNIQUET:  * No tourniquets in log *  DICTATION: .Other Dictation: Dictation Number [id number]  PLAN OF CARE: Admit to inpatient   PATIENT DISPOSITION:  PACU - hemodynamically stable

## 2024-02-01 NOTE — Plan of Care (Signed)

## 2024-02-01 NOTE — Op Note (Signed)
 NAME: Ashley, Wolfe MEDICAL RECORD NO: [id number] ACCOUNT NO: [id number] DATE OF BIRTH: Mar 31, 1946 FACILITY: [facility] LOCATION: [id number] PHYSICIAN: Graylin Shiver. August Saucer, MD  Operative Report   DATE OF PROCEDURE: 02/01/2024  PREOPERATIVE DIAGNOSIS:  Right hip intertrochanteric fracture.  POSTOPERATIVE DIAGNOSIS:  Right hip intertrochanteric fracture.  PROCEDURES PERFORMED:  Right hip intertrochanteric fracture, open reduction and internal fixation using Smith and Nephew Trigen Intertan nail 10 mm x 380 cm with 85 mm lag screw and 80 mm compression screw.  SURGEON:  Graylin Shiver. August Saucer, MD.  ASSISTANT:  Karenann Cai, PA  INDICATIONS:  The patient is a 78 year old patient with right hip pain following a fall.  She is very active.  Presents for operative management after explanation of risks and benefits.  DESCRIPTION OF PROCEDURE:  The patient was brought to the operating room where a general anesthetic was induced.  Preoperative antibiotics were administered.  A timeout was called.  Right leg was prescrubbed with alcohol and Betadine allowed to air dry,  prepped with DuraPrep solution and draped in a sterile manner. The patient was placed on the Hana bed with the left leg in a leg holder in the lithotomy position, well padded.  Traction and internal rotation was applied.  Under fluoroscopic guidance, the  fracture was reduced.  Good reduction confirmed in the AP and lateral planes.  It appeared more like an intertrochanteric fracture, although there was some subcapital fracture which did not go all the way through the head.  Based on the good alignment  and the relative complexity of the fracture, we proceeded with intramedullary hip nailing as opposed to hip replacement.  At this time, wall drape was applied after sterile prepping and draping.  Timeout was called.  Incision was made 4 fingerbreadths  proximal to the tip of the trochanter.  Skin and subcutaneous tissue were sharply divided.  A guide  pin was placed in the correct location confirmed under fluoroscopic guidance.  In accordance with preoperative templating, proximal reaming was performed  and the 38 x 10 mm nail was placed.  We chose the 125 as opposed to 130 nail because of the low femoral neck/high intertrochanteric nature of the fracture.  Wanted optimal fixation with less compression after intraoperative compression achieved.  The 125  construct would give some compression, but not as much as the more valgus oriented 130-degree construct.  The compression and lag screw were placed.  We did achieve about 7 mm of compression of the fracture.  Placed both screws in the inferior half of  the femoral neck.  Good reduction and hardware placement confirmed in the AP and lateral planes under fluoroscopy.  We did make a separate incision for the lag screw and compression screw placement.  These incisions were both irrigated.  Anesthetized  using Marcaine, morphine, and clonidine.  Fascia then closed using #1 Vicryl suture in both incisions followed by interrupted inverted 0-Vicryl suture, 2-0 Vicryl suture, and 3-0 nylon with Aquacel dressing applied.  The patient tolerated the procedure  well without immediate complications.  He was transferred to the recovery room in stable condition.  Luke's assistance was required for opening and closing, mobilization of tissue.  His assistance was of medical necessity.    MUK D: 02/01/2024 9:43:44 am T: 02/01/2024 10:41:00 am  JOB: [id number]/ [id number]

## 2024-02-02 ENCOUNTER — Encounter (HOSPITAL_COMMUNITY): Payer: Self-pay | Admitting: Orthopedic Surgery

## 2024-02-02 DIAGNOSIS — S72001A Fracture of unspecified part of neck of right femur, initial encounter for closed fracture: Secondary | ICD-10-CM | POA: Diagnosis not present

## 2024-02-02 LAB — CBC
HCT: 28.4 % — ABNORMAL LOW (ref 36.0–46.0)
Hemoglobin: 9.6 g/dL — ABNORMAL LOW (ref 12.0–15.0)
MCH: 33.8 pg (ref 26.0–34.0)
MCHC: 33.8 g/dL (ref 30.0–36.0)
MCV: 100 fL (ref 80.0–100.0)
Platelets: 148 10*3/uL — ABNORMAL LOW (ref 150–400)
RBC: 2.84 MIL/uL — ABNORMAL LOW (ref 3.87–5.11)
RDW: 13.8 % (ref 11.5–15.5)
WBC: 5.8 10*3/uL (ref 4.0–10.5)
nRBC: 0 % (ref 0.0–0.2)

## 2024-02-02 LAB — BASIC METABOLIC PANEL WITH GFR
Anion gap: 10 (ref 5–15)
BUN: 41 mg/dL — ABNORMAL HIGH (ref 8–23)
CO2: 18 mmol/L — ABNORMAL LOW (ref 22–32)
Calcium: 8.6 mg/dL — ABNORMAL LOW (ref 8.9–10.3)
Chloride: 105 mmol/L (ref 98–111)
Creatinine, Ser: 1.29 mg/dL — ABNORMAL HIGH (ref 0.44–1.00)
GFR, Estimated: 42 mL/min — ABNORMAL LOW (ref >60)
Glucose, Bld: 116 mg/dL — ABNORMAL HIGH (ref 70–99)
Potassium: 3.5 mmol/L (ref 3.5–5.1)
Sodium: 133 mmol/L — ABNORMAL LOW (ref 135–145)

## 2024-02-02 MED ORDER — OXYCODONE HCL 5 MG PO TABS
5.0000 mg | ORAL_TABLET | ORAL | 0 refills | Status: AC | PRN
Start: 2024-02-02 — End: ?

## 2024-02-02 MED ORDER — ASPIRIN 81 MG PO TBEC: 81.0000 mg | DELAYED_RELEASE_TABLET | Freq: Two times a day (BID) | ORAL | 12 refills | Status: DC

## 2024-02-02 NOTE — Care Management Important Message (Signed)
 Important Message  Patient Details  Name: [name] MRN: [id number] Date of Birth: 20-Oct-1945   Important Message Given:  Yes - Medicare IM     Felix Host 02/02/2024, 4:37 PM

## 2024-02-02 NOTE — TOC Initial Note (Signed)
 Transition of Care [facility]) - Initial/Assessment Note    Patient Details  Name: [name] MRN: [id number] Date of Birth: 01/22/1946  Transition of Care [facility]) CM/SW Contact:    Jannice Mends, LCSW Phone Number: 02/02/2024, 4:53 PM  Clinical Narrative:                 CSW received consult for possible SNF placement at time of discharge. CSW spoke with patient. Patient reported that patient's children are currently unable to care for patient at their home given patient's current physical needs and fall risk. Patient expressed understanding of PT recommendation and is agreeable to SNF placement at time of discharge. CSW discussed insurance authorization process and will provide Medicare SNF ratings list. CSW will send out referrals for review and provide bed offers as available.    Skilled Nursing Rehab Facilities-   [email or web address]   Ratings out of 5 stars (5 the highest)   Name Address  Phone # Quality Care Staffing Health Inspection Overall  [facility] [address] [phone] 3 3 4 4   [facility] [address], [place] [phone] 5 1 4 4   [facility] [address], [place]le [phone] 2 2 2 2   [facility] [address] [place] [phone] 5 2 4 5   [facility]  [address], [place] [phone] 4 3 5 5   [facility] [address], [facility] [phone] 4 2 2 2   [facility] [address] [place] [phone] 5 1 2 2   [facility] [address] [place] [phone] 2 1 3 2   [address] ([place]) [address], [place] [phone] 2 3 3 3   [facility] [address] [place], [place] [phone] 3 3 2 2   [facility] ([place]) [address], [place] [phone] 3 1 1 1   [facility] [address] [place] [phone] 2 3 4 4   [facility] [address] [place]  [phone] 4 4 3 3   [facility] ([facility]) [address], [place] [phone] 1 2 4 3           [facility] [address] [place] [phone] 2 1 4 3   [facility] [address], [place] [phone] 4 2 1 1   [facility]  [address], [place], [place] [address] [phone] 2 2 2 2   [facility] [address] [phone] 3 2 4 4   [facility] [address], [place] [phone] 2 1 2 1   [facility] (No UHC) [address], [place] [phone] 5 4 5 5   [facility] [address], [facility] [phone] 3 4 2 2   [facility] [address] [place] [phone] 2 1 1 1   [place] [address] [place] [phone] 4 2 5 5   [facility] [address], [place] [phone] 4 1 1 1   [facility] [address] [place], [place] [phone] 2 2 3 3           [facility]  [phone] 3 1 1 1   [facility] [address] [place] Alpha  [phone] 3 3 3 3   [facility] (No Humana) [address], [place] [phone] 2 2 4 4   [facility]) [address], [place] [phone] 2 1 1 1   [facility] [address], [place] [phone] 4 3 5 5   [facility] [address], [place] [phone] 1 1 1  1  [facility] [address] [place], [place] [phone] 5 4 5 5   [facility])  [address], [place] [phone] 1 1 2 1   [facility]) [address] [place] [phone]  2 4 4   [facility] [address], [place] [phone] 3 5 5 5   [address] [place], [place] [phone] 4 2 2 2   [facility]) [address] [place] [phone] 2 1 3 2      Expected Discharge Plan: Skilled Nursing Facility Barriers to Discharge: Continued Medical Work up, English as a second language teacher, SNF Pending bed offer   Patient Goals and  CMS Choice Patient states their goals for this hospitalization and ongoing recovery are:: Rehab CMS Medicare.gov Compare Post Acute Care list provided to:: Patient Choice offered to / list presented to : Patient [place] ownership interest in [facility].provided to:: Patient    Expected Discharge Plan and Services In-house Referral: Clinical Social Work   Post Acute Care Choice: Skilled Nursing Facility Living arrangements for the past 2 months: Single Family Home                                      Prior Living Arrangements/Services Living arrangements for the past 2 months: Single Family Home Lives with:: Self Patient language and need for interpreter reviewed:: Yes Do you feel safe going back to the place where you live?: Yes      Need for Family Participation in Patient Care: Yes (Comment) Care giver support system in place?: Yes (comment)   Criminal Activity/Legal Involvement Pertinent to Current Situation/Hospitalization: No - Comment as needed  Activities of Daily Living   ADL Screening (condition at time of admission) Independently performs ADLs?: Yes (appropriate for developmental age) Is the patient deaf or have difficulty hearing?: No Does the patient have difficulty seeing, even when wearing glasses/contacts?: No Does the patient have difficulty concentrating, remembering, or making decisions?: No  Permission Sought/Granted Permission sought to share information with : Facility Medical sales representative, Family Supports Permission granted to share information with : Yes, Verbal Permission Granted     Permission granted to share info w AGENCY: SNFs        Emotional Assessment Appearance:: Appears stated age Attitude/Demeanor/Rapport: Engaged Affect (typically observed): Accepting, Pleasant Orientation: : Oriented to Self, Oriented to Place, Oriented to  Time, Oriented to Situation Alcohol / Substance Use: Not Applicable Psych Involvement:  No (comment)  Admission diagnosis:  Hip fracture (HCC) [S72.009A] Fall, initial encounter [W19.XXXA] Closed fracture of right hip, initial encounter [facility]) [S72.001A] Patient Active Problem List   Diagnosis Date Noted   Fall at home, initial encounter 01/31/2024   Essential tremor 01/31/2024   Hip fracture (HCC) 01/30/2024   Bunion of right foot 11/26/2023   Right lower quadrant abdominal pain 10/05/2023   Left leg pain 06/28/2022   Hypokalemia 06/28/2022   Hyponatremia 05/17/2022   Dehydration 05/17/2022   Hypochloremia 05/17/2022   B12 deficiency 06/27/2021   Vitamin D  deficiency 06/27/2021   Tremor 12/23/2020   Gait disorder 12/23/2020   Drop attack 06/17/2020   Iron  deficiency 12/12/2019   Right low back pain 06/13/2018   Urinary urgency 06/13/2018   Hypothyroidism 02/20/2018   Urinary frequency 09/21/2017   Cold sore 09/21/2017   Abnormal urine odor 03/24/2017   Rosacea 01/18/2017   Dysuria 12/20/2016   Greater trochanteric bursitis of right hip  11/21/2016   Rheumatoid arthritis flare (HCC) 11/08/2016   Right hip pain 09/30/2016   Skin lesion 05/04/2016   Cough 02/03/2016   Wheezing 02/03/2016   RA (rheumatoid arthritis) (HCC) 09/16/2015   Lower back pain 03/31/2015   Arthritis of right shoulder region 12/15/2014   Anemia 09/26/2014   Right shoulder pain 09/26/2014   Night sweats 06/28/2014   Depression with anxiety 05/16/2014   Chest pain 03/28/2014   RLS (restless legs syndrome) 03/28/2014   Bilateral hand pain 12/26/2013   Impaired glucose tolerance 10/04/2013   Palpitations 07/17/2013   Cervical dystonia 06/20/2013   Headache 06/20/2013   Left arm pain 04/29/2013   Left arm swelling 04/29/2013   Osteopenia 04/04/2013   Tachy-brady syndrome (HCC) 04/04/2012   Encounter for well adult exam with abnormal findings 03/10/2012   Persistent headaches    DVT (deep venous thrombosis) (HCC)    Paresthesias    Junctional bradycardia    Hyperlipidemia    HTN  (hypertension)    Atrial fibrillation (HCC)    Recurrent UTI    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)    GERD (gastroesophageal reflux disease)    Allergic rhinitis    Fibrocystic breast    PCP:  Roslyn Coombe, MD Pharmacy:   [id number] [place], [place] - [address] [place] [address] Phone: [phone] Fax: [phone]     Social Drivers of Health (SDOH) Social History: SDOH Screenings   Food Insecurity: No Food Insecurity (02/02/2024)  Housing: Low Risk  (02/02/2024)  Transportation Needs: No Transportation Needs (02/02/2024)  Utilities: Not At Risk (02/02/2024)  Alcohol Screen: Low Risk  (01/02/2024)  Depression (PHQ2-9): Low Risk  (01/02/2024)  Financial Resource Strain: Low Risk  (01/02/2024)  Physical Activity: Insufficiently Active (01/02/2024)  Social Connections: Moderately Integrated (02/02/2024)  Stress: No Stress Concern Present (01/02/2024)  Tobacco Use: Low Risk  (02/01/2024)  Health Literacy: Adequate Health Literacy (01/02/2024)   SDOH Interventions:     Readmission Risk Interventions     No data to display

## 2024-02-02 NOTE — NC FL2 (Signed)
 [place]  MEDICAID FL2 LEVEL OF CARE FORM     IDENTIFICATION  Patient Name: [name] Birthdate: 07/30/1946 Sex: female Admission Date (Current Location): 01/30/2024   Ambulatory Surgery Center and [place] Number:  Producer, television/film/video and Address:  [facility], [address], [place], [place] [address]      Provider Number: [id number]  Attending Physician Name and Address:  Haydee Lipa, MD  Relative Name and Phone Number:       Current Level of Care: Hospital Recommended Level of Care: Skilled Nursing Facility Prior Approval Number:    Date Approved/Denied:   PASRR Number: [id number]  Discharge Plan: SNF    Current Diagnoses: Patient Active Problem List   Diagnosis Date Noted   Fall at home, initial encounter 01/31/2024   Essential tremor 01/31/2024   Hip fracture (HCC) 01/30/2024   Bunion of right foot 11/26/2023   Right lower quadrant abdominal pain 10/05/2023   Left leg pain 06/28/2022   Hypokalemia 06/28/2022   Hyponatremia 05/17/2022   Dehydration 05/17/2022   Hypochloremia 05/17/2022   B12 deficiency 06/27/2021   Vitamin D  deficiency 06/27/2021   Tremor 12/23/2020   Gait disorder 12/23/2020   Drop attack 06/17/2020   Iron  deficiency 12/12/2019   Right low back pain 06/13/2018   Urinary urgency 06/13/2018   Hypothyroidism 02/20/2018   Urinary frequency 09/21/2017   Cold sore 09/21/2017   Abnormal urine odor 03/24/2017   Rosacea 01/18/2017   Dysuria 12/20/2016   Greater trochanteric bursitis of right hip 11/21/2016   Rheumatoid arthritis flare (HCC) 11/08/2016   Right hip pain 09/30/2016   Skin lesion 05/04/2016   Cough 02/03/2016   Wheezing 02/03/2016   RA (rheumatoid arthritis) (HCC) 09/16/2015   Lower back pain 03/31/2015   Arthritis of right shoulder region 12/15/2014   Anemia 09/26/2014   Right shoulder pain 09/26/2014   Night sweats 06/28/2014   Depression with anxiety 05/16/2014   Chest pain 03/28/2014   RLS  (restless legs syndrome) 03/28/2014   Bilateral hand pain 12/26/2013   Impaired glucose tolerance 10/04/2013   Palpitations 07/17/2013   Cervical dystonia 06/20/2013   Headache 06/20/2013   Left arm pain 04/29/2013   Left arm swelling 04/29/2013   Osteopenia 04/04/2013   Tachy-brady syndrome (HCC) 04/04/2012   Encounter for well adult exam with abnormal findings 03/10/2012   Persistent headaches    DVT (deep venous thrombosis) (HCC)    Paresthesias    Junctional bradycardia    Hyperlipidemia    HTN (hypertension)    Atrial fibrillation (HCC)    Recurrent UTI    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)    GERD (gastroesophageal reflux disease)    Allergic rhinitis    Fibrocystic breast     Orientation RESPIRATION BLADDER Height & Weight     Self, Time, Situation, Place  Normal Continent Weight: 118 lb (53.5 kg) Height:  5' (152.4 cm)  BEHAVIORAL SYMPTOMS/MOOD NEUROLOGICAL BOWEL NUTRITION STATUS      Continent Diet (see dc summary)  AMBULATORY STATUS COMMUNICATION OF NEEDS Skin   Extensive Assist Verbally Surgical wounds (closed incision on thigh)                       Personal Care Assistance Level of Assistance  Bathing, Feeding, Dressing Bathing Assistance: Maximum assistance Feeding assistance: Independent Dressing Assistance: Limited assistance     Functional Limitations Info             SPECIAL CARE FACTORS  FREQUENCY  PT (By licensed PT), OT (By licensed OT)     PT Frequency: 5x/week OT Frequency: 5x/week            Contractures Contractures Info: Not present    Additional Factors Info  Allergies, Code Status Code Status Info: Full Allergies Info: Allegra  (Fexofenadine ), Atenolol, Ciprofloxacin, Codeine, Cortisone, Doxycycline, Fosamax (Alendronate), Hydrochlorothiazide , Klonopin  (Clonazepam ), Lisinopril , Prednisone , Ramipril, Sulfa Antibiotics, Sulfamethoxazole-trimethoprim, Tizanidine , Verapamil, Chocolate           Current  Medications (02/02/2024):  This is the current hospital active medication list Current Facility-Administered Medications  Medication Dose Route Frequency Provider Last Rate Last Admin   acetaminophen  (TYLENOL ) tablet 325-650 mg  325-650 mg Oral Q6H PRN Magnant, Charles L, PA-C       albuterol  (VENTOLIN  HFA) 108 (90 Base) MCG/ACT inhaler 2 puff  2 puff Inhalation Q4H PRN Bennie Brave, MD       aspirin  EC tablet 81 mg  81 mg Oral BID Magnant, Charles L, PA-C   81 mg at 02/02/24 1610   Chlorhexidine  Gluconate Cloth 2 % PADS 6 each  6 each Topical Q0600 Bennie Brave, MD   6 each at 02/02/24 0514   docusate sodium  (COLACE) capsule 100 mg  100 mg Oral BID Magnant, Charles L, PA-C   100 mg at 02/02/24 0820   ezetimibe  (ZETIA ) tablet 10 mg  10 mg Oral Daily Goel, Hersh, MD   10 mg at 02/02/24 0818   folic acid  (FOLVITE ) tablet 2 mg  2 mg Oral Daily Goel, Hersh, MD   2 mg at 02/02/24 0820   HYDROmorphone  (DILAUDID ) injection 0.5 mg  0.5 mg Intravenous Q4H PRN Magnant, Charles L, PA-C       levothyroxine  (SYNTHROID ) tablet 50 mcg  50 mcg Oral Daily Bennie Brave, MD   50 mcg at 02/02/24 9604   menthol -cetylpyridinium (CEPACOL) lozenge 3 mg  1 lozenge Oral PRN Magnant, Charles L, PA-C       Or   phenol (CHLORASEPTIC) mouth spray 1 spray  1 spray Mouth/Throat PRN Magnant, Charles L, PA-C       methocarbamol  (ROBAXIN ) tablet 500 mg  500 mg Oral Q6H PRN Magnant, Charles L, PA-C       Or   methocarbamol  (ROBAXIN ) injection 500 mg  500 mg Intravenous Q6H PRN Magnant, Charles L, PA-C       methotrexate  (RHEUMATREX) tablet 17.5 mg  17.5 mg Oral Weekly Goel, Hersh, MD   17.5 mg at 01/31/24 1556   metoCLOPramide  (REGLAN ) tablet 5 mg  5 mg Oral Q8H PRN Magnant, Charles L, PA-C       Or   metoCLOPramide  (REGLAN ) injection 5 mg  5 mg Intravenous Q8H PRN Magnant, Charles L, PA-C       metoprolol  tartrate (LOPRESSOR ) tablet 50 mg  50 mg Oral BID Goel, Hersh, MD   50 mg at 02/02/24 5409   morphine  (PF) 2 MG/ML injection 2  mg  2 mg Intravenous Q1H PRN Bennie Brave, MD       mupirocin  ointment (BACTROBAN ) 2 % 1 Application  1 Application Nasal BID Bennie Brave, MD   1 Application at 02/02/24 0819   ondansetron  (ZOFRAN ) tablet 4 mg  4 mg Oral Q6H PRN Magnant, Charles L, PA-C       Or   ondansetron  (ZOFRAN ) injection 4 mg  4 mg Intravenous Q6H PRN Magnant, Charles L, PA-C       oxyCODONE  (Oxy IR/ROXICODONE ) immediate release tablet 5 mg  5 mg Oral  Q4H PRN Bennie Brave, MD   5 mg at 02/02/24 1610   pantoprazole  (PROTONIX ) EC tablet 40 mg  40 mg Oral Daily Goel, Hersh, MD   40 mg at 02/01/24 1713   polyethylene glycol (MIRALAX  / GLYCOLAX ) packet 17 g  17 g Oral Daily PRN Bennie Brave, MD       senna-docusate (Senokot-S) tablet 2 tablet  2 tablet Oral BID Goel, Hersh, MD   2 tablet at 02/02/24 9604   topiramate  (TOPAMAX ) tablet 100 mg  100 mg Oral BID Goel, Hersh, MD   100 mg at 02/02/24 5409     Discharge Medications: Please see discharge summary for a list of discharge medications.  Relevant Imaging Results:  Relevant Lab Results:   Additional Information SSN: [id number] [place], [place]

## 2024-02-02 NOTE — Progress Notes (Addendum)
 PROGRESS NOTE    [name]  XBJ:[id number] DOB: 04/27/1946 DOA: 01/30/2024 PCP: Ashley Coombe, MD   Brief Narrative:  [name] is a 78 y.o. female with medical history significant of rheumatoid arthritis, essential tremors.  Patient presents with right hip pain after mechanical fall, imaging confirms right proximal intertrochanteric femur fracture.  Hospitalist called for admission, orthopedics called in consult.  Assessment & Plan:   Principal Problem:   Hip fracture (HCC) Active Problems:   Fall at home, initial encounter   Essential tremor   Right intertrochanteric femur fracture, POA Fall at home, initial encounter Unclear etiology, no notable loss of consciousness or syncope  Questionably mechanical  Pain currently well-controlled Ortho following along, ORIF 02/01/2024 with [name] -tolerated well PT OT to follow, disposition plan pending patient's amatory status DVT prophylaxis and pain per orthopedics   AKI Perioperatively - increase PO intake as appropriate  Elevated INR Unclear etiology, follow repeat labs  Essential tremor, chronic Continue home topamax   Hypothyroidism Continue levothyroxine    Rheumatoid arthritis, chronic Continue methotrexate , folic acid , LFTs within normal limits  HTN (hypertension) - Borderline hypotension at intake, likely complicated by narcotics - Continue metoprolol , discontinue lisinopril  HCTZ in the interim  DVT prophylaxis: SCDs Start: 02/01/24 1007 enoxaparin  (LOVENOX ) injection 40 mg Start: 02/01/24 0800 SCDs Start: 01/31/24 0950   Code Status:   Code Status: Full Code Family Communication: None present  Status is: Inpatient  Dispo: The patient is from: Home              Anticipated d/c is to: To be determined              Anticipated d/c date is: 48 to 72 hours              Patient currently not medically stable for discharge  Consultants:  Orthopedic surgery  Procedures:  ORIF right femur  02/01/2024  Antimicrobials:  Perioperatively  Subjective: No acute issues or events overnight  Objective: Vitals:   02/01/24 2236 02/02/24 0041 02/02/24 0345 02/02/24 0730  BP: (!) 118/44 (!) 108/55 (!) 100/53 (!) 140/61  Pulse: 78 69 68 73  Resp:  16 16 16   Temp:  97.9 F (36.6 C)  97.7 F (36.5 C)  TempSrc:  Oral    SpO2:  95% 96% 97%  Weight:      Height:        Intake/Output Summary (Last 24 hours) at 02/02/2024 0801 Last data filed at 02/02/2024 0500 Gross per 24 hour  Intake 740 ml  Output 575 ml  Net 165 ml   Filed Weights   01/31/24 0900 02/01/24 0640  Weight: 53.5 kg 53.5 kg    Examination:  General:  Pleasantly resting in bed, No acute distress. HEENT:  Normocephalic atraumatic.  Sclerae nonicteric, noninjected.  Extraocular movements intact bilaterally. Neck:  Without mass or deformity.  Trachea is midline. Lungs:  Clear to auscultate bilaterally without rhonchi, wheeze, or rales. Heart:  Regular rate and rhythm.  Without murmurs, rubs, or gallops. Abdomen:  Soft, nontender, nondistended.  Without guarding or rebound. Extremities: Without cyanosis, clubbing, edema  Data Reviewed: I have personally reviewed following labs and imaging studies  CBC: Recent Labs  Lab 01/30/24 1656 01/31/24 1219 02/01/24 0557 02/02/24 0619  WBC 11.1* 7.4 6.8 5.8  HGB 12.1 10.8* 11.1* 9.6*  HCT 34.0* 31.8* 31.3* 28.4*  MCV 98.8 99.4 98.1 100.0  PLT 216 189 169 148*   Basic Metabolic Panel: Recent Labs  Lab  01/30/24 1656 01/31/24 1219 02/01/24 0557 02/02/24 0619  NA 136 135 136 133*  K 3.4* 3.1* 3.8 3.5  CL 106 108 108 105  CO2 19* 18* 20* 18*  GLUCOSE 104* 119* 100* 116*  BUN 29* 22 27* 41*  CREATININE 0.76 0.84 0.85 1.29*  CALCIUM 9.1 8.8* 8.9 8.6*   GFR: Estimated Creatinine Clearance: 25.8 mL/min (A) (by C-G formula based on SCr of 1.29 mg/dL (H)). Liver Function Tests: Recent Labs  Lab 01/31/24 1219  AST 24  ALT 18  ALKPHOS 58  BILITOT 1.2   PROT 5.6*  ALBUMIN 3.0*   No results for input(s): "LIPASE", "AMYLASE" in the last 168 hours. No results for input(s): "AMMONIA" in the last 168 hours. Coagulation Profile: Recent Labs  Lab 02/01/24 0557  INR 1.7*   Cardiac Enzymes: Recent Labs  Lab 01/31/24 1638  CKTOTAL 121   Recent Results (from the past 240 hours)  Surgical PCR screen     Status: Abnormal   Collection Time: 01/31/24 10:04 AM   Specimen: Nasal Mucosa; Nasal Swab  Result Value Ref Range Status   MRSA, PCR NEGATIVE NEGATIVE Final   Staphylococcus aureus POSITIVE (A) NEGATIVE Final    Comment: (NOTE) The Xpert SA Assay (FDA approved for NASAL specimens in patients 81 years of age and older), is one component of a comprehensive surveillance program. It is not intended to diagnose infection nor to guide or monitor treatment. Performed at [facility], [address] [place], [place] [address]     Radiology Studies: ECHOCARDIOGRAM COMPLETE Result Date: 02/01/2024    ECHOCARDIOGRAM REPORT   Patient Name:   [name] Date of Exam: 02/01/2024 Medical Rec #:  [id number]          Height:       60.0 in Accession #:    [id number]         Weight:       118.0 lb Date of Birth:  1946/03/16          BSA:          1.492 m Patient Age:    77 years           BP:           138/59 mmHg Patient Gender: F                  HR:           62 bpm. Exam Location:  Inpatient Procedure: 2D Echo, Color Doppler and Cardiac Doppler (Both Spectral and Color            Flow Doppler were utilized during procedure). Indications:    Elevated Troponin  History:        Patient has no prior history of Echocardiogram examinations.  Sonographer:    [name] RDCS Referring Phys: [facility] GOEL IMPRESSIONS  1. Left ventricular ejection fraction, by estimation, is 60 to 65%. The left ventricle has normal function. The left ventricle has no regional wall motion abnormalities. Left ventricular diastolic parameters are consistent with Grade I  diastolic dysfunction (impaired relaxation).  2. Right ventricular systolic function is normal. The right ventricular size is normal.  3. The mitral valve is normal in structure. No evidence of mitral valve regurgitation. No evidence of mitral stenosis.  4. The aortic valve is tricuspid. There is mild calcification of the aortic valve. Aortic valve regurgitation is mild. Aortic valve sclerosis/calcification is present, without any evidence of aortic stenosis.  5. The inferior vena cava is normal in size with greater than 50% respiratory variability, suggesting right atrial pressure of 3 mmHg. FINDINGS  Left Ventricle: Left ventricular ejection fraction, by estimation, is 60 to 65%. The left ventricle has normal function. The left ventricle has no regional wall motion abnormalities. The left ventricular internal cavity size was normal in size. There is  no left ventricular hypertrophy. Left ventricular diastolic parameters are consistent with Grade I diastolic dysfunction (impaired relaxation). Right Ventricle: The right ventricular size is normal. No increase in right ventricular wall thickness. Right ventricular systolic function is normal. Left Atrium: Left atrial size was normal in size. Right Atrium: Right atrial size was normal in size. Pericardium: There is no evidence of pericardial effusion. Mitral Valve: The mitral valve is normal in structure. No evidence of mitral valve regurgitation. No evidence of mitral valve stenosis. Tricuspid Valve: The tricuspid valve is normal in structure. Tricuspid valve regurgitation is mild . No evidence of tricuspid stenosis. Aortic Valve: The aortic valve is tricuspid. There is mild calcification of the aortic valve. Aortic valve regurgitation is mild. Aortic valve sclerosis/calcification is present, without any evidence of aortic stenosis. Pulmonic Valve: The pulmonic valve was normal in structure. Pulmonic valve regurgitation is mild. No evidence of pulmonic stenosis.  Aorta: The aortic root is normal in size and structure. Venous: The inferior vena cava is normal in size with greater than 50% respiratory variability, suggesting right atrial pressure of 3 mmHg. IAS/Shunts: No atrial level shunt detected by color flow Doppler.  LEFT VENTRICLE PLAX 2D LVIDd:         4.30 cm   Diastology LVIDs:         3.00 cm   LV e' medial:    6.09 cm/s LV PW:         1.00 cm   LV E/e' medial:  12.0 LV IVS:        1.00 cm   LV e' lateral:   8.16 cm/s LVOT diam:     1.70 cm   LV E/e' lateral: 8.9 LV SV:         52 LV SV Index:   35 LVOT Area:     2.27 cm  RIGHT VENTRICLE         IVC TAPSE (M-mode): 1.8 cm  IVC diam: 1.40 cm LEFT ATRIUM             Index        RIGHT ATRIUM           Index LA diam:        3.10 cm 2.08 cm/m   RA Area:     12.90 cm LA Vol (A2C):   38.4 ml 25.74 ml/m  RA Volume:   28.80 ml  19.31 ml/m LA Vol (A4C):   25.7 ml 17.23 ml/m LA Biplane Vol: 33.0 ml 22.12 ml/m  AORTIC VALVE LVOT Vmax:   104.00 cm/s LVOT Vmean:  62.100 cm/s LVOT VTI:    0.229 m  AORTA Ao Root diam: 2.90 cm Ao Asc diam:  3.50 cm MITRAL VALVE MV Area (PHT): 2.69 cm    SHUNTS MV Decel Time: 282 msec    Systemic VTI:  0.23 m MV E velocity: 72.80 cm/s  Systemic Diam: 1.70 cm MV A velocity: 89.10 cm/s MV E/A ratio:  0.82 Jules Oar MD Electronically signed by Jules Oar MD Signature Date/Time: 02/01/2024/2:20:41 PM    Final    DG FEMUR PORT, MIN 2 VIEWS RIGHT Result  Date: 02/01/2024 CLINICAL DATA:  Status post right hip fracture fixation. EXAM: RIGHT FEMUR PORTABLE 2 VIEW COMPARISON:  Preoperative imaging FINDINGS: Femoral intramedullary nail with trans trochanteric screw fixation traverse proximal femur fracture. Recent postsurgical change includes air and edema in the soft tissues. IMPRESSION: ORIF of proximal femur fracture. Electronically Signed   By: Chadwick Colonel M.D.   On: 02/01/2024 13:06   DG FEMUR, MIN 2 VIEWS RIGHT Result Date: 02/01/2024 CLINICAL DATA:  Elective surgery. EXAM:  RIGHT FEMUR 2 VIEWS COMPARISON:  Preoperative exam FINDINGS: Eight fluoroscopic spot views of the right femur submitted from the operating room. Femoral intramedullary nail with trans trochanteric screw fixation traversing proximal femur fracture. Fluoroscopy time 47 seconds. Dose 7.1 mGy. IMPRESSION: Intraoperative fluoroscopy during proximal femur fracture fixation. Electronically Signed   By: Chadwick Colonel M.D.   On: 02/01/2024 09:08   DG C-Arm 1-60 Min-No Report Result Date: 02/01/2024 Fluoroscopy was utilized by the requesting physician.  No radiographic interpretation.   DG C-Arm 1-60 Min-No Report Result Date: 02/01/2024 Fluoroscopy was utilized by the requesting physician.  No radiographic interpretation.   DG CHEST PORT 1 VIEW Result Date: 01/31/2024 CLINICAL DATA:  Fall.  Preoperative exam. EXAM: PORTABLE CHEST 1 VIEW COMPARISON:  Chest radiograph dated 07/27/2022. FINDINGS: The heart size and mediastinal contours are within normal limits. Aortic atherosclerosis. No focal consolidation, pleural effusion, or pneumothorax. No acute osseous abnormality identified. IMPRESSION: 1. No acute cardiopulmonary findings. 2.  Aortic Atherosclerosis (ICD10-I70.0). Electronically Signed   By: Mannie Seek M.D.   On: 01/31/2024 16:24   CT HIP RIGHT WO CONTRAST Result Date: 01/31/2024 CLINICAL DATA:  Right hip trauma after fall. EXAM: CT OF THE RIGHT HIP WITHOUT CONTRAST TECHNIQUE: Multidetector CT imaging of the right hip was performed according to the standard protocol. Multiplanar CT image reconstructions were also generated. RADIATION DOSE REDUCTION: This exam was performed according to the departmental dose-optimization program which includes automated exposure control, adjustment of the mA and/or kV according to patient size and/or use of iterative reconstruction technique. COMPARISON:  Right hip radiographs dated 01/30/2024. FINDINGS: Bones/Joint/Cartilage Oblique fracture of the right proximal  femur extending proximally through the subcapital femoral head anteriorly with intertrochanteric extension distally and posteriorly. Fracture margins extend to the posterior facet of the greater trochanter and through the lesser trochanter. There is approximately 4 cm of impaction and 6 mm of posterior displacement at the level of the posterior facet of the greater trochanter. The right femoral head is seated within the acetabulum. The visualized right sacroiliac joint is intact. Ligaments Ligaments are suboptimally evaluated by CT. Soft tissue and Muscles No discrete soft tissue and intramuscular fluid collection. IMPRESSION: Acute impacted and displaced oblique fracture of the right proximal femur involving the subcapital femoral head anteriorly with intertrochanteric extension. Electronically Signed   By: Mannie Seek M.D.   On: 01/31/2024 16:22        Scheduled Meds:  acetaminophen   1,000 mg Oral Q6H   aspirin  EC  81 mg Oral BID   celecoxib   200 mg Oral BID   Chlorhexidine  Gluconate Cloth  6 each Topical Q0600   docusate sodium   100 mg Oral BID   enoxaparin  (LOVENOX ) injection  40 mg Subcutaneous Q24H   ezetimibe   10 mg Oral Daily   folic acid   2 mg Oral Daily   levothyroxine   50 mcg Oral Daily   methotrexate   17.5 mg Oral Weekly   metoprolol  tartrate  50 mg Oral BID   mupirocin  ointment  1 Application  Nasal BID   pantoprazole   40 mg Oral Daily   senna-docusate  2 tablet Oral BID   topiramate   100 mg Oral BID   Continuous Infusions:    LOS: 2 days   Time spent:  Haydee Lipa, DO [facility]  If 7PM-7AM, please contact night-coverage [email or web address]  02/02/2024, 8:01 AM

## 2024-02-02 NOTE — Evaluation (Signed)
 Physical Therapy Evaluation Patient Details Name: [name] MRN: [id number] DOB: Jan 16, 1946 Today's Date: 02/02/2024  History of Present Illness  78 yo female presets to ED on 4/15 for fall to floor, resulting in R intertrochanteric fx, s/p fixation with IM rod 4/17. PMH includes  RA, OA, tachybradycardia syndrome, GERD, DVT, gastritis, hypertension, hyperlipidemia, atrial fibrillation.  Clinical Impression   Pt presents with generalized weakness, impaired balance with history of falls, RLE pain and weakness post-op, impaired knowledge and application of WB precautions, and decreased activity tolerance. Pt to benefit from acute PT to address deficits. Pt requiring mod assist for transfer-level mobility, pt very weak in standing and benefits more from squat pivot or stand with stedy at this time. Pt lives alone and is typically independent, Patient will benefit from continued inpatient follow up therapy, <3 hours/day. PT to progress mobility as tolerated, and will continue to follow acutely.          If plan is discharge home, recommend the following: A little help with walking and/or transfers;A little help with bathing/dressing/bathroom   Can travel by private vehicle        Equipment Recommendations None recommended by PT  Recommendations for Other Services       Functional Status Assessment Patient has had a recent decline in their functional status and demonstrates the ability to make significant improvements in function in a reasonable and predictable amount of time.     Precautions / Restrictions Precautions Precautions: Fall Restrictions Weight Bearing Restrictions Per Provider Order: Yes RLE Weight Bearing Per Provider Order: Partial weight bearing RLE Partial Weight Bearing Percentage or Pounds: 50%, for transfers only. Otherwise, NWB RLE      Mobility  Bed Mobility Overal bed mobility: Needs Assistance Bed Mobility: Supine to Sit     Supine to sit: Mod  assist     General bed mobility comments: assist for trunk elevation, LE progression to EOB, scooting towards EOB with assist of bed pad. Step-by-step cuing    Transfers Overall transfer level: Needs assistance Equipment used: Rolling walker (2 wheels) Transfers: Sit to/from Stand, Bed to chair/wheelchair/BSC Sit to Stand: Mod assist Stand pivot transfers: Mod assist   Squat pivot transfers: Mod assist     General transfer comment: assist for power up, rise, steadying, and attempt to pivot mostly on LLE with RW, but pt unable given pain and fatigue. assist for squat pivot towards L for power up, bodyweight support, and minimizing WB through RLE.    Ambulation/Gait                  Stairs            Wheelchair Mobility     Tilt Bed    Modified Rankin (Stroke Patients Only)       Balance Overall balance assessment: Needs assistance, History of Falls Sitting-balance support: No upper extremity supported, Feet supported Sitting balance-Leahy Scale: Fair     Standing balance support: Bilateral upper extremity supported, During functional activity Standing balance-Leahy Scale: Poor Standing balance comment: reliant on PT assist                             Pertinent Vitals/Pain Pain Assessment Pain Assessment: 0-10 Pain Score: 3  Pain Location: R hip Pain Descriptors / Indicators: Sore Pain Intervention(s): Limited activity within patient's tolerance, Monitored during session, Repositioned    Home Living Family/patient expects to be discharged to:: Private residence Living Arrangements:  Alone Available Help at Discharge: Family Type of Home: House (townhouse) Home Access: Stairs to enter Entrance Stairs-Rails: None Entrance Stairs-Number of Steps: 1   Home Layout: One level Home Equipment: Tub bench;Cane - single point      Prior Function Prior Level of Function : Independent/Modified Independent             Mobility Comments:  uses cane in community       Extremity/Trunk Assessment   Upper Extremity Assessment Upper Extremity Assessment: Defer to OT evaluation    Lower Extremity Assessment Lower Extremity Assessment: Generalized weakness    Cervical / Trunk Assessment Cervical / Trunk Assessment: Normal  Communication   Communication Communication: No apparent difficulties    Cognition Arousal: Alert Behavior During Therapy: WFL for tasks assessed/performed   PT - Cognitive impairments: No apparent impairments                         Following commands: Intact       Cueing Cueing Techniques: Verbal cues, Gestural cues     General Comments      Exercises General Exercises - Lower Extremity Ankle Circles/Pumps: AROM, Right, 5 reps, Supine Quad Sets: AROM, Right, 5 reps, Supine Heel Slides: AAROM, Right, 5 reps, Supine   Assessment/Plan    PT Assessment Patient needs continued PT services  PT Problem List Decreased strength;Decreased mobility;Decreased activity tolerance;Decreased balance;Decreased knowledge of use of DME;Pain;Decreased knowledge of precautions;Decreased safety awareness;Decreased range of motion       PT Treatment Interventions DME instruction;Therapeutic activities;Gait training;Therapeutic exercise;Patient/family education;Balance training;Stair training;Functional mobility training;Neuromuscular re-education    PT Goals (Current goals can be found in the Care Plan section)  Acute Rehab PT Goals PT Goal Formulation: With patient Time For Goal Achievement: 02/16/24 Potential to Achieve Goals: Good    Frequency Min 2X/week     Co-evaluation               AM-PAC PT 6 Clicks Mobility  Outcome Measure Help needed turning from your back to your side while in a flat bed without using bedrails?: A Lot Help needed moving from lying on your back to sitting on the side of a flat bed without using bedrails?: A Lot Help needed moving to and from a bed  to a chair (including a wheelchair)?: A Lot Help needed standing up from a chair using your arms (e.g., wheelchair or bedside chair)?: A Lot Help needed to walk in hospital room?: Total Help needed climbing 3-5 steps with a railing? : Total 6 Click Score: 10    End of Session   Activity Tolerance: Patient tolerated treatment well Patient left: in chair;with call bell/phone within reach;with chair alarm set Nurse Communication: Mobility status;Need for lift equipment (stedy for back to bed) PT Visit Diagnosis: Other abnormalities of gait and mobility (R26.89);Muscle weakness (generalized) (M62.81)    Time: 8879-8856 PT Time Calculation (min) (ACUTE ONLY): 23 min   Charges:   PT Evaluation $PT Eval Low Complexity: 1 Low PT Treatments $Therapeutic Activity: 8-22 mins PT General Charges $$ ACUTE PT VISIT: 1 Visit         Johana RAMAN, PT DPT Acute Rehabilitation Services Secure Chat Preferred  Office [phone]   Suheyla Mortellaro E Johna 02/02/2024, 3:08 PM

## 2024-02-02 NOTE — Plan of Care (Signed)
 Problem: Education: Goal: Knowledge of General Education information will improve Description: Including pain rating scale, medication(s)/side effects and non-pharmacologic comfort measures 02/02/2024 1923 by Mardene Shake, LPN Outcome: Progressing 02/02/2024 1922 by Mardene Shake, LPN Outcome: Progressing   Problem: Health Behavior/Discharge Planning: Goal: Ability to manage health-related needs will improve 02/02/2024 1923 by Mardene Shake, LPN Outcome: Progressing 02/02/2024 1922 by Mardene Shake, LPN Outcome: Progressing   Problem: Clinical Measurements: Goal: Ability to maintain clinical measurements within normal limits will improve 02/02/2024 1923 by Mardene Shake, LPN Outcome: Progressing 02/02/2024 1922 by Mardene Shake, LPN Outcome: Progressing Goal: Will remain free from infection 02/02/2024 1923 by Mardene Shake, LPN Outcome: Progressing 02/02/2024 1922 by Mardene Shake, LPN Outcome: Progressing Goal: Diagnostic test results will improve 02/02/2024 1923 by Mardene Shake, LPN Outcome: Progressing 02/02/2024 1922 by Mardene Shake, LPN Outcome: Progressing Goal: Respiratory complications will improve 02/02/2024 1923 by Mardene Shake, LPN Outcome: Progressing 02/02/2024 1922 by Mardene Shake, LPN Outcome: Progressing Goal: Cardiovascular complication will be avoided 02/02/2024 1923 by Mardene Shake, LPN Outcome: Progressing 02/02/2024 1922 by Mardene Shake, LPN Outcome: Progressing   Problem: Activity: Goal: Risk for activity intolerance will decrease 02/02/2024 1923 by Mardene Shake, LPN Outcome: Progressing 02/02/2024 1922 by Mardene Shake, LPN Outcome: Progressing   Problem: Nutrition: Goal: Adequate nutrition will be maintained 02/02/2024 1923 by Mardene Shake, LPN Outcome: Progressing 02/02/2024 1922 by Mardene Shake, LPN Outcome: Progressing   Problem: Coping: Goal: Level of anxiety will decrease 02/02/2024  1923 by Mardene Shake, LPN Outcome: Progressing 02/02/2024 1922 by Mardene Shake, LPN Outcome: Progressing   Problem: Elimination: Goal: Will not experience complications related to bowel motility 02/02/2024 1923 by Mardene Shake, LPN Outcome: Progressing 02/02/2024 1922 by Mardene Shake, LPN Outcome: Progressing Goal: Will not experience complications related to urinary retention 02/02/2024 1923 by Mardene Shake, LPN Outcome: Progressing 02/02/2024 1922 by Mardene Shake, LPN Outcome: Progressing   Problem: Pain Managment: Goal: General experience of comfort will improve and/or be controlled 02/02/2024 1923 by Mardene Shake, LPN Outcome: Progressing 02/02/2024 1922 by Mardene Shake, LPN Outcome: Progressing   Problem: Safety: Goal: Ability to remain free from injury will improve 02/02/2024 1923 by Mardene Shake, LPN Outcome: Progressing 02/02/2024 1922 by Mardene Shake, LPN Outcome: Progressing   Problem: Skin Integrity: Goal: Risk for impaired skin integrity will decrease 02/02/2024 1923 by Mardene Shake, LPN Outcome: Progressing 02/02/2024 1922 by Mardene Shake, LPN Outcome: Progressing   Problem: Education: Goal: Verbalization of understanding the information provided (i.e., activity precautions, restrictions, etc) will improve 02/02/2024 1923 by Mardene Shake, LPN Outcome: Progressing 02/02/2024 1922 by Mardene Shake, LPN Outcome: Progressing Goal: Individualized Educational Video(s) 02/02/2024 1923 by Mardene Shake, LPN Outcome: Progressing 02/02/2024 1922 by Mardene Shake, LPN Outcome: Progressing   Problem: Activity: Goal: Ability to ambulate and perform ADLs will improve 02/02/2024 1923 by Mardene Shake, LPN Outcome: Progressing 02/02/2024 1922 by Mardene Shake, LPN Outcome: Progressing   Problem: Clinical Measurements: Goal: Postoperative complications will be avoided or minimized 02/02/2024 1923 by Mardene Shake, LPN Outcome: Progressing 02/02/2024 1922 by Mardene Shake, LPN Outcome: Progressing   Problem: Self-Concept: Goal: Ability to maintain and perform role responsibilities to the fullest extent possible will improve 02/02/2024 1923 by Mardene Shake, LPN Outcome: Progressing 02/02/2024 1922 by Mardene Shake, LPN Outcome: Progressing   Problem:  Pain Management: Goal: Pain level will decrease 02/02/2024 1923 by Mardene Shake, LPN Outcome: Progressing 02/02/2024 1922 by Mardene Shake, LPN Outcome: Progressing

## 2024-02-02 NOTE — Progress Notes (Signed)
  Subjective:  Subjective: Pt stable - pain ok controlled by meds   Objective: Vital signs in last 24 hours: Temp:  [97.4 F (36.3 C)-98.5 F (36.9 C)] 97.7 F (36.5 C) (04/18 0730) Pulse Rate:  [60-90] 73 (04/18 0730) Resp:  [11-17] 16 (04/18 0730) BP: (100-140)/(43-73) 140/61 (04/18 0730) SpO2:  [92 %-97 %] 97 % (04/18 0730)  Intake/Output from previous day: 04/17 0701 - 04/18 0700 In: 840 [P.O.:240; I.V.:500; IV Piggyback:100] Out: 575 [Urine:550; Blood:25] Intake/Output this shift: No intake/output data recorded.  Exam:  Neurologically intact Sensation intact distally Intact pulses distally  Labs: Recent Labs    01/30/24 1656 01/31/24 1219 02/01/24 0557 02/02/24 0619  HGB 12.1 10.8* 11.1* 9.6*   Recent Labs    02/01/24 0557 02/02/24 0619  WBC 6.8 5.8  RBC 3.19* 2.84*  HCT 31.3* 28.4*  PLT 169 148*   Recent Labs    02/01/24 0557 02/02/24 0619  NA 136 133*  K 3.8 3.5  CL 108 105  CO2 20* 18*  BUN 27* 41*  CREATININE 0.85 1.29*  GLUCOSE 100* 116*  CALCIUM 8.9 8.6*   Recent Labs    02/01/24 0557  INR 1.7*    Assessment/Plan: See below   [employer] 02/02/2024, 8:19 AM       Objective: Vital signs in last 24 hours: Temp:  [97.4 F (36.3 C)-98.5 F (36.9 C)] 97.7 F (36.5 C) (04/18 0730) Pulse Rate:  [60-90] 73 (04/18 0730) Resp:  [11-17] 16 (04/18 0730) BP: (100-140)/(43-73) 140/61 (04/18 0730) SpO2:  [92 %-97 %] 97 % (04/18 0730)  Intake/Output from previous day: 04/17 0701 - 04/18 0700 In: 840 [P.O.:240; I.V.:500; IV Piggyback:100] Out: 575 [Urine:550; Blood:25] Intake/Output this shift: No intake/output data recorded.  Exam:  Sensation intact distally Intact pulses distally Dorsiflexion/Plantar flexion intact  Labs: Recent Labs    01/30/24 1656 01/31/24 1219 02/01/24 0557 02/02/24 0619  HGB 12.1 10.8* 11.1* 9.6*   Recent Labs    02/01/24 0557 02/02/24 0619  WBC 6.8 5.8  RBC 3.19* 2.84*  HCT 31.3*  28.4*  PLT 169 148*   Recent Labs    02/01/24 0557 02/02/24 0619  NA 136 133*  K 3.8 3.5  CL 108 105  CO2 20* 18*  BUN 27* 41*  CREATININE 0.85 1.29*  GLUCOSE 100* 116*  CALCIUM 8.9 8.6*   Recent Labs    02/01/24 0557  INR 1.7*    Assessment/Plan: Mobilize with pt for pwb for transfers only, o/w nwb - asa for dvt prophylaxis - sw consult for snf   [employer] 02/02/2024, 8:19 AM

## 2024-02-02 NOTE — Plan of Care (Signed)

## 2024-02-03 DIAGNOSIS — S72001A Fracture of unspecified part of neck of right femur, initial encounter for closed fracture: Secondary | ICD-10-CM | POA: Diagnosis not present

## 2024-02-03 LAB — PROTIME-INR
INR: 1.4 — ABNORMAL HIGH (ref 0.8–1.2)
Prothrombin Time: 17.6 s — ABNORMAL HIGH (ref 11.4–15.2)

## 2024-02-03 NOTE — Progress Notes (Signed)
 Transition of Care [facility]) - CAGE-AID Screening   Patient Details  Name: [name] MRN: [id number] Date of Birth: Feb 18, 1946  Asa Bjork, RN Trauma Response Nurse Phone Number: [phone] 02/03/2024, 10:23 AM    CAGE-AID Screening:    Have You Ever Felt You Ought to Cut Down on Your Drinking or Drug Use?: No Have People Annoyed You By [employer] Your Drinking Or Drug Use?: No Have You Felt Bad Or Guilty About Your Drinking Or Drug Use?: No Have You Ever Had a Drink or Used Drugs First Thing In The Morning to Steady Your Nerves or to Get Rid of a Hangover?: No CAGE-AID Score: 0  Substance Abuse Education Offered: (S) No (no services necessary)

## 2024-02-03 NOTE — Plan of Care (Signed)
  Problem: Activity: Goal: Risk for activity intolerance will decrease Outcome: Progressing   Problem: Skin Integrity: Goal: Risk for impaired skin integrity will decrease Outcome: Progressing   Problem: Activity: Goal: Ability to ambulate and perform ADLs will improve Outcome: Progressing

## 2024-02-03 NOTE — Progress Notes (Signed)
 PROGRESS NOTE    [name]  MVH:[id number] DOB: [birth year] DOA: 01/30/2024 PCP: Roslyn Coombe, MD   Brief Narrative:  [name] is a 78 y.o. female with medical history significant of rheumatoid arthritis, essential tremors.  Patient presents with right hip pain after mechanical fall, imaging confirms right proximal intertrochanteric femur fracture.  Hospitalist called for admission, orthopedics called in consult.  Patient remains medically stable for discharge, awaiting safe disposition to SNF, insurance authorization and bed approval pending.  Assessment & Plan:   Principal Problem:   Hip fracture (HCC) Active Problems:   Fall at home, initial encounter   Essential tremor   Right intertrochanteric femur fracture, POA Fall at home, initial encounter Unclear etiology, no notable loss of consciousness or syncope  Questionably mechanical  Pain currently well-controlled Ortho following along, ORIF 02/01/2024 with Dr. Rozelle Corning -tolerated well PT OT following, recommending SNF given patient's nonweightbearing recommendations by orthopedics DVT prophylaxis and pain per orthopedics   AKI Perioperatively - increase PO intake as appropriate  Elevated INR, improving Unclear etiology, repeat labs downtrending appropriately  Essential tremor, chronic Continue home topamax   Hypothyroidism Continue levothyroxine    Rheumatoid arthritis, chronic Continue methotrexate , folic acid , LFTs within normal limits  HTN (hypertension) - Borderline hypotension at intake, likely complicated by narcotics - Continue metoprolol , discontinue lisinopril  HCTZ in the interim  DVT prophylaxis: SCDs Start: 02/01/24 1007 SCDs Start: 01/31/24 0950   Code Status:   Code Status: Full Code Family Communication: None present  Status is: Inpatient  Dispo: The patient is from: Home              Anticipated d/c is to: To be determined              Anticipated d/c date is: 48 to 72 hours               Patient currently not medically stable for discharge  Consultants:  Orthopedic surgery  Procedures:  ORIF right femur 02/01/2024  Antimicrobials:  Perioperatively  Subjective: No acute issues or events overnight  Objective: Vitals:   02/02/24 0730 02/02/24 1550 02/02/24 1928 02/03/24 0429  BP: (!) 140/61 138/81 (!) 148/68 119/66  Pulse: 73 79 76 77  Resp: 16 16 16 16   Temp: 97.7 F (36.5 C) 99 F (37.2 C) 98.7 F (37.1 C) 98.1 F (36.7 C)  TempSrc:  Oral    SpO2: 97% 97% 99% 100%  Weight:      Height:        Intake/Output Summary (Last 24 hours) at 02/03/2024 0719 Last data filed at 02/02/2024 1700 Gross per 24 hour  Intake 600 ml  Output 500 ml  Net 100 ml   Filed Weights   01/31/24 0900 02/01/24 0640  Weight: 53.5 kg 53.5 kg    Examination:  General:  Pleasantly resting in bed, No acute distress. HEENT:  Normocephalic atraumatic.  Sclerae nonicteric, noninjected.  Extraocular movements intact bilaterally. Neck:  Without mass or deformity.  Trachea is midline. Lungs:  Clear to auscultate bilaterally without rhonchi, wheeze, or rales. Heart:  Regular rate and rhythm.  Without murmurs, rubs, or gallops. Abdomen:  Soft, nontender, nondistended.  Without guarding or rebound. Extremities: Without cyanosis, clubbing, edema  Data Reviewed: I have personally reviewed following labs and imaging studies  CBC: Recent Labs  Lab 01/30/24 1656 01/31/24 1219 02/01/24 0557 02/02/24 0619  WBC 11.1* 7.4 6.8 5.8  HGB 12.1 10.8* 11.1* 9.6*  HCT 34.0* 31.8* 31.3* 28.4*  MCV 98.8 99.4  98.1 100.0  PLT 216 189 169 148*   Basic Metabolic Panel: Recent Labs  Lab 01/30/24 1656 01/31/24 1219 02/01/24 0557 02/02/24 0619  NA 136 135 136 133*  K 3.4* 3.1* 3.8 3.5  CL 106 108 108 105  CO2 19* 18* 20* 18*  GLUCOSE 104* 119* 100* 116*  BUN 29* 22 27* 41*  CREATININE 0.76 0.84 0.85 1.29*  CALCIUM 9.1 8.8* 8.9 8.6*   GFR: Estimated Creatinine Clearance: 25.8  mL/min (A) (by C-G formula based on SCr of 1.29 mg/dL (H)). Liver Function Tests: Recent Labs  Lab 01/31/24 1219  AST 24  ALT 18  ALKPHOS 58  BILITOT 1.2  PROT 5.6*  ALBUMIN 3.0*   No results for input(s): "LIPASE", "AMYLASE" in the last 168 hours. No results for input(s): "AMMONIA" in the last 168 hours. Coagulation Profile: Recent Labs  Lab 02/01/24 0557  INR 1.7*   Cardiac Enzymes: Recent Labs  Lab 01/31/24 1638  CKTOTAL 121   Recent Results (from the past 240 hours)  Surgical PCR screen     Status: Abnormal   Collection Time: 01/31/24 10:04 AM   Specimen: Nasal Mucosa; Nasal Swab  Result Value Ref Range Status   MRSA, PCR NEGATIVE NEGATIVE Final   Staphylococcus aureus POSITIVE (A) NEGATIVE Final    Comment: (NOTE) The Xpert SA Assay (FDA approved for NASAL specimens in patients 50 years of age and older), is one component of a comprehensive surveillance program. It is not intended to diagnose infection nor to guide or monitor treatment. Performed at [facility], [address] [place], [place] [address]     Radiology Studies: ECHOCARDIOGRAM COMPLETE Result Date: 02/01/2024    ECHOCARDIOGRAM REPORT   Patient Name:   [name] Date of Exam: 02/01/2024 Medical Rec #:  [id number]          Height:       60.0 in Accession #:    [id number]         Weight:       118.0 lb Date of Birth:  [birth year]          BSA:          1.492 m Patient Age:    77 years           BP:           138/59 mmHg Patient Gender: F                  HR:           62 bpm. Exam Location:  Inpatient Procedure: 2D Echo, Color Doppler and Cardiac Doppler (Both Spectral and Color            Flow Doppler were utilized during procedure). Indications:    Elevated Troponin  History:        Patient has no prior history of Echocardiogram examinations.  Sonographer:    Hersey Lorenzo RDCS Referring Phys: [facility] GOEL IMPRESSIONS  1. Left ventricular ejection fraction, by estimation, is 60 to 65%. The  left ventricle has normal function. The left ventricle has no regional wall motion abnormalities. Left ventricular diastolic parameters are consistent with Grade I diastolic dysfunction (impaired relaxation).  2. Right ventricular systolic function is normal. The right ventricular size is normal.  3. The mitral valve is normal in structure. No evidence of mitral valve regurgitation. No evidence of mitral stenosis.  4. The aortic valve is tricuspid. There is mild calcification of the aortic valve.  Aortic valve regurgitation is mild. Aortic valve sclerosis/calcification is present, without any evidence of aortic stenosis.  5. The inferior vena cava is normal in size with greater than 50% respiratory variability, suggesting right atrial pressure of 3 mmHg. FINDINGS  Left Ventricle: Left ventricular ejection fraction, by estimation, is 60 to 65%. The left ventricle has normal function. The left ventricle has no regional wall motion abnormalities. The left ventricular internal cavity size was normal in size. There is  no left ventricular hypertrophy. Left ventricular diastolic parameters are consistent with Grade I diastolic dysfunction (impaired relaxation). Right Ventricle: The right ventricular size is normal. No increase in right ventricular wall thickness. Right ventricular systolic function is normal. Left Atrium: Left atrial size was normal in size. Right Atrium: Right atrial size was normal in size. Pericardium: There is no evidence of pericardial effusion. Mitral Valve: The mitral valve is normal in structure. No evidence of mitral valve regurgitation. No evidence of mitral valve stenosis. Tricuspid Valve: The tricuspid valve is normal in structure. Tricuspid valve regurgitation is mild . No evidence of tricuspid stenosis. Aortic Valve: The aortic valve is tricuspid. There is mild calcification of the aortic valve. Aortic valve regurgitation is mild. Aortic valve sclerosis/calcification is present, without any  evidence of aortic stenosis. Pulmonic Valve: The pulmonic valve was normal in structure. Pulmonic valve regurgitation is mild. No evidence of pulmonic stenosis. Aorta: The aortic root is normal in size and structure. Venous: The inferior vena cava is normal in size with greater than 50% respiratory variability, suggesting right atrial pressure of 3 mmHg. IAS/Shunts: No atrial level shunt detected by color flow Doppler.  LEFT VENTRICLE PLAX 2D LVIDd:         4.30 cm   Diastology LVIDs:         3.00 cm   LV e' medial:    6.09 cm/s LV PW:         1.00 cm   LV E/e' medial:  12.0 LV IVS:        1.00 cm   LV e' lateral:   8.16 cm/s LVOT diam:     1.70 cm   LV E/e' lateral: 8.9 LV SV:         52 LV SV Index:   35 LVOT Area:     2.27 cm  RIGHT VENTRICLE         IVC TAPSE (M-mode): 1.8 cm  IVC diam: 1.40 cm LEFT ATRIUM             Index        RIGHT ATRIUM           Index LA diam:        3.10 cm 2.08 cm/m   RA Area:     12.90 cm LA Vol (A2C):   38.4 ml 25.74 ml/m  RA Volume:   28.80 ml  19.31 ml/m LA Vol (A4C):   25.7 ml 17.23 ml/m LA Biplane Vol: 33.0 ml 22.12 ml/m  AORTIC VALVE LVOT Vmax:   104.00 cm/s LVOT Vmean:  62.100 cm/s LVOT VTI:    0.229 m  AORTA Ao Root diam: 2.90 cm Ao Asc diam:  3.50 cm MITRAL VALVE MV Area (PHT): 2.69 cm    SHUNTS MV Decel Time: 282 msec    Systemic VTI:  0.23 m MV E velocity: 72.80 cm/s  Systemic Diam: 1.70 cm MV A velocity: 89.10 cm/s MV E/A ratio:  0.82 Jules Oar MD Electronically signed by Jules Oar MD Signature Date/Time:  02/01/2024/2:20:41 PM    Final    DG FEMUR PORT, MIN 2 VIEWS RIGHT Result Date: 02/01/2024 CLINICAL DATA:  Status post right hip fracture fixation. EXAM: RIGHT FEMUR PORTABLE 2 VIEW COMPARISON:  Preoperative imaging FINDINGS: Femoral intramedullary nail with trans trochanteric screw fixation traverse proximal femur fracture. Recent postsurgical change includes air and edema in the soft tissues. IMPRESSION: ORIF of proximal femur fracture.  Electronically Signed   By: Chadwick Colonel M.D.   On: 02/01/2024 13:06   DG FEMUR, MIN 2 VIEWS RIGHT Result Date: 02/01/2024 CLINICAL DATA:  Elective surgery. EXAM: RIGHT FEMUR 2 VIEWS COMPARISON:  Preoperative exam FINDINGS: Eight fluoroscopic spot views of the right femur submitted from the operating room. Femoral intramedullary nail with trans trochanteric screw fixation traversing proximal femur fracture. Fluoroscopy time 47 seconds. Dose 7.1 mGy. IMPRESSION: Intraoperative fluoroscopy during proximal femur fracture fixation. Electronically Signed   By: Chadwick Colonel M.D.   On: 02/01/2024 09:08   DG C-Arm 1-60 Min-No Report Result Date: 02/01/2024 Fluoroscopy was utilized by the requesting physician.  No radiographic interpretation.   DG C-Arm 1-60 Min-No Report Result Date: 02/01/2024 Fluoroscopy was utilized by the requesting physician.  No radiographic interpretation.        Scheduled Meds:  aspirin  EC  81 mg Oral BID   docusate sodium   100 mg Oral BID   ezetimibe   10 mg Oral Daily   folic acid   2 mg Oral Daily   levothyroxine   50 mcg Oral Daily   methotrexate   17.5 mg Oral Weekly   metoprolol  tartrate  50 mg Oral BID   mupirocin  ointment  1 Application Nasal BID   pantoprazole   40 mg Oral Daily   senna-docusate  2 tablet Oral BID   topiramate   100 mg Oral BID   Continuous Infusions:    LOS: 3 days   Time spent:  Haydee Lipa, DO [facility]  If 7PM-7AM, please contact night-coverage [email or web address]  02/03/2024, 7:19 AM

## 2024-02-03 NOTE — Evaluation (Signed)
 Occupational Therapy Evaluation Patient Details Name: [name] MRN: [id number] DOB: 05-Aug-1946 Today's Date: 02/03/2024   History of Present Illness   78 yo female presets to ED on 4/15 for fall to floor, resulting in R intertrochanteric fx, s/p fixation with IM rod 4/17. PMH includes  RA, OA, tachybradycardia syndrome, GERD, DVT, gastritis, hypertension, hyperlipidemia, atrial fibrillation.     Clinical Impressions Pt admitted for above, PTA pt lived alone and reports being ind with ADLs/iADLs. Pt currently doing well with pain tolerance and able to stand pivot with Min A + RW and completing ADLs with max A to setup A, more assist needed for LB ADLs. OT to continue following pt acutely to address listed deficits and help transition to next level of care. Patient will benefit from continued inpatient follow up therapy, <3 hours/day      If plan is discharge home, recommend the following:   A little help with walking and/or transfers;A lot of help with bathing/dressing/bathroom;Assistance with cooking/housework;Assist for transportation     Functional Status Assessment   Patient has had a recent decline in their functional status and demonstrates the ability to make significant improvements in function in a reasonable and predictable amount of time.     Equipment Recommendations   BSC/3in1;Wheelchair (measurements OT);Wheelchair cushion (measurements OT)     Recommendations for Other Services         Precautions/Restrictions   Precautions Precautions: Fall Restrictions Weight Bearing Restrictions Per Provider Order: Yes RLE Weight Bearing Per Provider Order: Partial weight bearing RLE Partial Weight Bearing Percentage or Pounds: 50%, for transfers only. Otherwise, NWB RLE     Mobility Bed Mobility Overal bed mobility: Needs Assistance Bed Mobility: Supine to Sit     Supine to sit: Min assist     General bed mobility comments: min A for LE  management    Transfers Overall transfer level: Needs assistance Equipment used: Rolling walker (2 wheels) Transfers: Sit to/from Stand, Bed to chair/wheelchair/BSC Sit to Stand: Min assist Stand pivot transfers: Min assist         General transfer comment: STS from EOB min A, with min A to pivot. OT foot underneath RLE heel to montior pressure and offload PRN to maintain WB precautions.      Balance Overall balance assessment: Needs assistance, History of Falls Sitting-balance support: No upper extremity supported, Feet supported Sitting balance-Leahy Scale: Fair     Standing balance support: Bilateral upper extremity supported, During functional activity Standing balance-Leahy Scale: Poor Standing balance comment: reliant on ext assist                           ADL either performed or assessed with clinical judgement   ADL Overall ADL's : Needs assistance/impaired Eating/Feeding: Sitting;Independent   Grooming: Sitting;Oral care;Set up   Upper Body Bathing: Sitting;Set up   Lower Body Bathing: Minimal assistance;Sitting/lateral leans   Upper Body Dressing : Sitting;Set up   Lower Body Dressing: Maximal assistance;Sitting/lateral leans Lower Body Dressing Details (indicate cue type and reason): max A to don R sock, pt able to don L sock without challenge Toilet Transfer: Minimal assistance;Rolling walker (2 wheels);Stand-pivot Statistician Details (indicate cue type and reason): simulated wiht Recliner Toileting- Clothing Manipulation and Hygiene: Maximal assistance;Sit to/from stand       Functional mobility during ADLs: Minimal assistance;Rolling walker (2 wheels) (pivot transfers, 50% WB)       Vision  Perception         Praxis         Pertinent Vitals/Pain Pain Assessment Pain Assessment: Faces Faces Pain Scale: Hurts a little bit Pain Location: R hip Pain Descriptors / Indicators: Sore Pain Intervention(s): Limited  activity within patient's tolerance, Monitored during session, Repositioned, Other (comment) (Pt declined need for pain meds)     Extremity/Trunk Assessment Upper Extremity Assessment Upper Extremity Assessment: Generalized weakness   Lower Extremity Assessment Lower Extremity Assessment: Generalized weakness   Cervical / Trunk Assessment Cervical / Trunk Assessment: Normal   Communication Communication Communication: No apparent difficulties   Cognition   Behavior During Therapy: WFL for tasks assessed/performed Cognition: No apparent impairments                               Following commands: Intact       Cueing  General Comments   Cueing Techniques: Verbal cues;Gestural cues  Pt family present and supportive. RLE elevated for edema management.   Exercises     Shoulder Instructions      Home Living Family/patient expects to be discharged to:: Private residence Living Arrangements: Alone Available Help at Discharge: Family Type of Home: House (townhouse) Home Access: Stairs to enter Secretary/administrator of Steps: 1 Entrance Stairs-Rails: None Home Layout: One level     Bathroom Shower/Tub: Chief Strategy Officer: Handicapped height     Home Equipment: Tub bench;Cane - single point          Prior Functioning/Environment Prior Level of Function : Independent/Modified Independent             Mobility Comments: uses cane in community ADLs Comments: ind    OT Problem List: Decreased strength;Impaired balance (sitting and/or standing);Decreased knowledge of precautions;Pain   OT Treatment/Interventions: Self-care/ADL training;Patient/family education;Therapeutic exercise;Balance training;Therapeutic activities;DME and/or AE instruction      OT Goals(Current goals can be found in the care plan section)   Acute Rehab OT Goals Patient Stated Goal: To get better OT Goal Formulation: With patient/family Time For Goal  Achievement: 02/17/24 Potential to Achieve Goals: Good ADL Goals Pt Will Perform Lower Body Bathing: with set-up;sitting/lateral leans Pt Will Perform Lower Body Dressing: with supervision;with set-up;with adaptive equipment;sitting/lateral leans Pt Will Transfer to Toilet: stand pivot transfer;with supervision   OT Frequency:  Min 2X/week    Co-evaluation              AM-PAC OT "6 Clicks" Daily Activity     Outcome Measure Help from another person eating meals?: None Help from another person taking care of personal grooming?: A Little Help from another person toileting, which includes using toliet, bedpan, or urinal?: A Lot Help from another person bathing (including washing, rinsing, drying)?: A Little Help from another person to put on and taking off regular upper body clothing?: A Little Help from another person to put on and taking off regular lower body clothing?: A Lot 6 Click Score: 17   End of Session Equipment Utilized During Treatment: Gait belt;Rolling walker (2 wheels) Nurse Communication: Mobility status;Weight bearing status (PWB transfers only)  Activity Tolerance: Patient tolerated treatment well Patient left: in chair;with call bell/phone within reach;with chair alarm set;with family/visitor present  OT Visit Diagnosis: Other abnormalities of gait and mobility (R26.89);Unsteadiness on feet (R26.81);Pain;History of falling (Z91.81) Pain - Right/Left: Right Pain - part of body: Hip  Time: 1610-9604 OT Time Calculation (min): 25 min Charges:  OT General Charges $OT Visit: 1 Visit OT Evaluation $OT Eval Moderate Complexity: 1 Mod OT Treatments $Therapeutic Activity: 8-22 mins  02/03/2024  AB, OTR/L  Acute Rehabilitation Services  Office: [phone]   [name] 02/03/2024, 1:50 PM

## 2024-02-04 DIAGNOSIS — S72001A Fracture of unspecified part of neck of right femur, initial encounter for closed fracture: Secondary | ICD-10-CM | POA: Diagnosis not present

## 2024-02-04 LAB — PROTIME-INR
INR: 1.4 — ABNORMAL HIGH (ref 0.8–1.2)
Prothrombin Time: 17.7 s — ABNORMAL HIGH (ref 11.4–15.2)

## 2024-02-04 LAB — BASIC METABOLIC PANEL WITH GFR
Anion gap: 7 (ref 5–15)
BUN: 23 mg/dL (ref 8–23)
CO2: 22 mmol/L (ref 22–32)
Calcium: 8.6 mg/dL — ABNORMAL LOW (ref 8.9–10.3)
Chloride: 108 mmol/L (ref 98–111)
Creatinine, Ser: 0.7 mg/dL (ref 0.44–1.00)
GFR, Estimated: >60 mL/min (ref >60)
Glucose, Bld: 105 mg/dL — ABNORMAL HIGH (ref 70–99)
Potassium: 3.8 mmol/L (ref 3.5–5.1)
Sodium: 137 mmol/L (ref 135–145)

## 2024-02-04 LAB — CBC
HCT: 26.8 % — ABNORMAL LOW (ref 36.0–46.0)
Hemoglobin: 9.1 g/dL — ABNORMAL LOW (ref 12.0–15.0)
MCH: 34.1 pg — ABNORMAL HIGH (ref 26.0–34.0)
MCHC: 34 g/dL (ref 30.0–36.0)
MCV: 100.4 fL — ABNORMAL HIGH (ref 80.0–100.0)
Platelets: 199 10*3/uL (ref 150–400)
RBC: 2.67 MIL/uL — ABNORMAL LOW (ref 3.87–5.11)
RDW: 13.6 % (ref 11.5–15.5)
WBC: 4.3 10*3/uL (ref 4.0–10.5)
nRBC: 0 % (ref 0.0–0.2)

## 2024-02-04 NOTE — Plan of Care (Signed)

## 2024-02-04 NOTE — Plan of Care (Signed)
  Problem: Activity: Goal: Risk for activity intolerance will decrease Outcome: Progressing   Problem: Safety: Goal: Ability to remain free from injury will improve Outcome: Progressing   

## 2024-02-04 NOTE — Progress Notes (Signed)
 PROGRESS NOTE    [name]  ZOX:[id number] DOB: 1946-05-08 DOA: 01/30/2024 PCP: Roslyn Coombe, MD   Brief Narrative:  [name] is a 78 y.o. female with medical history significant of rheumatoid arthritis, essential tremors.  Patient presents with right hip pain after mechanical fall, imaging confirms right proximal intertrochanteric femur fracture.  Hospitalist called for admission, orthopedics called in consult.  Patient remains medically stable for discharge, awaiting safe disposition to SNF, insurance authorization and bed approval pending.  Assessment & Plan:   Principal Problem:   Hip fracture (HCC) Active Problems:   Fall at home, initial encounter   Essential tremor   Right intertrochanteric femur fracture, POA Fall at home, initial encounter Unclear etiology, no notable loss of consciousness or syncope  Questionably mechanical  Pain currently well-controlled Ortho following along, ORIF 02/01/2024 with Dr. Rozelle Corning -tolerated well PT OT following, recommending SNF given patient's nonweightbearing recommendations by orthopedics DVT prophylaxis and pain per orthopedics   AKI Perioperatively - increase PO intake as appropriate  Elevated INR, improving Unclear etiology, repeat labs downtrending appropriately  Essential tremor, chronic Continue home topamax   Hypothyroidism Continue levothyroxine    Rheumatoid arthritis, chronic Continue methotrexate , folic acid , LFTs within normal limits  HTN (hypertension) - Borderline hypotension at intake, likely complicated by narcotics - Continue metoprolol , discontinue lisinopril  HCTZ in the interim  DVT prophylaxis: SCDs Start: 02/01/24 1007 SCDs Start: 01/31/24 0950   Code Status:   Code Status: Full Code Family Communication: None present  Status is: Inpatient  Dispo: The patient is from: Home              Anticipated d/c is to: To be determined              Anticipated d/c date is: 48 to 72 hours               Patient currently not medically stable for discharge  Consultants:  Orthopedic surgery  Procedures:  ORIF right femur 02/01/2024  Antimicrobials:  Perioperatively  Subjective: No acute issues or events overnight  Objective: Vitals:   02/03/24 0819 02/03/24 1557 02/03/24 1931 02/04/24 0505  BP: (!) 121/57 (!) 153/65 (!) 128/56 126/65  Pulse: 79 89 85 72  Resp: 16 18 18 16   Temp: (!) 97.5 F (36.4 C) 98.8 F (37.1 C) (!) 100.5 F (38.1 C) 98.8 F (37.1 C)  TempSrc: Oral Oral  Oral  SpO2: 98% 100% 98% 98%  Weight:      Height:        Intake/Output Summary (Last 24 hours) at 02/04/2024 0655 Last data filed at 02/03/2024 0930 Gross per 24 hour  Intake --  Output 0 ml  Net 0 ml   Filed Weights   01/31/24 0900 02/01/24 0640  Weight: 53.5 kg 53.5 kg    Examination:  General:  Pleasantly resting in bed, No acute distress. HEENT:  Normocephalic atraumatic.  Sclerae nonicteric, noninjected.  Extraocular movements intact bilaterally. Neck:  Without mass or deformity.  Trachea is midline. Lungs:  Clear to auscultate bilaterally without rhonchi, wheeze, or rales. Heart:  Regular rate and rhythm.  Without murmurs, rubs, or gallops. Abdomen:  Soft, nontender, nondistended.  Without guarding or rebound. Extremities: Without cyanosis, clubbing, edema  Data Reviewed: I have personally reviewed following labs and imaging studies  CBC: Recent Labs  Lab 01/30/24 1656 01/31/24 1219 02/01/24 0557 02/02/24 0619  WBC 11.1* 7.4 6.8 5.8  HGB 12.1 10.8* 11.1* 9.6*  HCT 34.0* 31.8* 31.3* 28.4*  MCV  98.8 99.4 98.1 100.0  PLT 216 189 169 148*   Basic Metabolic Panel: Recent Labs  Lab 01/30/24 1656 01/31/24 1219 02/01/24 0557 02/02/24 0619  NA 136 135 136 133*  K 3.4* 3.1* 3.8 3.5  CL 106 108 108 105  CO2 19* 18* 20* 18*  GLUCOSE 104* 119* 100* 116*  BUN 29* 22 27* 41*  CREATININE 0.76 0.84 0.85 1.29*  CALCIUM 9.1 8.8* 8.9 8.6*   GFR: Estimated Creatinine  Clearance: 25.8 mL/min (A) (by C-G formula based on SCr of 1.29 mg/dL (H)). Liver Function Tests: Recent Labs  Lab 01/31/24 1219  AST 24  ALT 18  ALKPHOS 58  BILITOT 1.2  PROT 5.6*  ALBUMIN 3.0*   No results for input(s): "LIPASE", "AMYLASE" in the last 168 hours. No results for input(s): "AMMONIA" in the last 168 hours. Coagulation Profile: Recent Labs  Lab 02/01/24 0557 02/03/24 0640  INR 1.7* 1.4*   Cardiac Enzymes: Recent Labs  Lab 01/31/24 1638  CKTOTAL 121   Recent Results (from the past 240 hours)  Surgical PCR screen     Status: Abnormal   Collection Time: 01/31/24 10:04 AM   Specimen: Nasal Mucosa; Nasal Swab  Result Value Ref Range Status   MRSA, PCR NEGATIVE NEGATIVE Final   Staphylococcus aureus POSITIVE (A) NEGATIVE Final    Comment: (NOTE) The Xpert SA Assay (FDA approved for NASAL specimens in patients 78 years of age and older), is one component of a comprehensive surveillance program. It is not intended to diagnose infection nor to guide or monitor treatment. Performed at [facility], [address], [place], [place] [address]     Radiology Studies: No results found.       Scheduled Meds:  aspirin  EC  81 mg Oral BID   docusate sodium   100 mg Oral BID   ezetimibe   10 mg Oral Daily   folic acid   2 mg Oral Daily   levothyroxine   50 mcg Oral Daily   methotrexate   17.5 mg Oral Weekly   metoprolol  tartrate  50 mg Oral BID   mupirocin  ointment  1 Application Nasal BID   pantoprazole   40 mg Oral Daily   senna-docusate  2 tablet Oral BID   topiramate   100 mg Oral BID   Continuous Infusions:    LOS: 4 days   Time spent:  Haydee Lipa, DO [facility]  If 7PM-7AM, please contact night-coverage [email or web address]  02/04/2024, 6:55 AM

## 2024-02-05 DIAGNOSIS — S72001A Fracture of unspecified part of neck of right femur, initial encounter for closed fracture: Secondary | ICD-10-CM | POA: Diagnosis not present

## 2024-02-05 LAB — PROTIME-INR
INR: 1.4 — ABNORMAL HIGH (ref 0.8–1.2)
Prothrombin Time: 17.4 s — ABNORMAL HIGH (ref 11.4–15.2)

## 2024-02-05 NOTE — Plan of Care (Signed)

## 2024-02-05 NOTE — Progress Notes (Signed)
 PROGRESS NOTE    [name]  ZOX:[id number] DOB: 19-Oct-1945 DOA: 01/30/2024 PCP: Roslyn Coombe, MD   Brief Narrative:  [name] is a 78 y.o. female with medical history significant of rheumatoid arthritis, essential tremors.  Patient presents with right hip pain after mechanical fall, imaging confirms right proximal intertrochanteric femur fracture.  Hospitalist called for admission, orthopedics called in consult.  Patient remains medically stable for discharge, awaiting safe disposition to SNF, insurance authorization and bed approval pending.  Assessment & Plan:   Principal Problem:   Hip fracture (HCC) Active Problems:   Fall at home, initial encounter   Essential tremor   Right intertrochanteric femur fracture, POA Fall at home, initial encounter Unclear etiology, no notable loss of consciousness or syncope  Questionably mechanical  Pain currently well-controlled Ortho following along, ORIF 02/01/2024 with Dr. Rozelle Corning -tolerated well PT OT following, recommending SNF given patient's nonweightbearing recommendations by orthopedics DVT prophylaxis and pain per orthopedics   AKI Perioperatively - increase PO intake as appropriate  Elevated INR, improving Unclear etiology, repeat labs downtrending appropriately  Essential tremor, chronic Continue home topamax   Hypothyroidism Continue levothyroxine    Rheumatoid arthritis, chronic Continue methotrexate , folic acid , LFTs within normal limits  HTN (hypertension) - Borderline hypotension at intake, likely complicated by narcotics - Continue metoprolol , discontinue lisinopril  HCTZ in the interim  DVT prophylaxis: SCDs Start: 02/01/24 1007 SCDs Start: 01/31/24 0950   Code Status:   Code Status: Full Code Family Communication: None present  Status is: Inpatient  Dispo: The patient is from: Home              Anticipated d/c is to: To be determined              Anticipated d/c date is: 48 to 72 hours               Patient currently not medically stable for discharge  Consultants:  Orthopedic surgery  Procedures:  ORIF right femur 02/01/2024  Antimicrobials:  Perioperatively  Subjective: No acute issues or events overnight  Objective: Vitals:   02/04/24 1437 02/04/24 2022 02/05/24 0426 02/05/24 0715  BP: 128/60 (!) 166/70 114/61 (!) 115/53  Pulse: 75 84 77 72  Resp: 18 16 16    Temp: 98.8 F (37.1 C) 98.9 F (37.2 C) 99.4 F (37.4 C) 98.6 F (37 C)  TempSrc: Oral   Oral  SpO2: 99% 98% 95% 97%  Weight:      Height:        Intake/Output Summary (Last 24 hours) at 02/05/2024 0733 Last data filed at 02/04/2024 1300 Gross per 24 hour  Intake 480 ml  Output --  Net 480 ml   Filed Weights   01/31/24 0900 02/01/24 0640  Weight: 53.5 kg 53.5 kg    Examination:  General:  Pleasantly resting in bed, No acute distress. HEENT:  Normocephalic atraumatic.  Sclerae nonicteric, noninjected.  Extraocular movements intact bilaterally. Neck:  Without mass or deformity.  Trachea is midline. Lungs:  Clear to auscultate bilaterally without rhonchi, wheeze, or rales. Heart:  Regular rate and rhythm.  Without murmurs, rubs, or gallops. Abdomen:  Soft, nontender, nondistended.  Without guarding or rebound. Extremities: Without cyanosis, clubbing, edema  Data Reviewed: I have personally reviewed following labs and imaging studies  CBC: Recent Labs  Lab 01/30/24 1656 01/31/24 1219 02/01/24 0557 02/02/24 0619 02/04/24 0727  WBC 11.1* 7.4 6.8 5.8 4.3  HGB 12.1 10.8* 11.1* 9.6* 9.1*  HCT 34.0* 31.8* 31.3* 28.4* 26.8*  MCV 98.8 99.4 98.1 100.0 100.4*  PLT 216 189 169 148* 199   Basic Metabolic Panel: Recent Labs  Lab 01/30/24 1656 01/31/24 1219 02/01/24 0557 02/02/24 0619 02/04/24 0727  NA 136 135 136 133* 137  K 3.4* 3.1* 3.8 3.5 3.8  CL 106 108 108 105 108  CO2 19* 18* 20* 18* 22  GLUCOSE 104* 119* 100* 116* 105*  BUN 29* 22 27* 41* 23  CREATININE 0.76 0.84 0.85 1.29*  0.70  CALCIUM 9.1 8.8* 8.9 8.6* 8.6*   GFR: Estimated Creatinine Clearance: 41.6 mL/min (by C-G formula based on SCr of 0.7 mg/dL). Liver Function Tests: Recent Labs  Lab 01/31/24 1219  AST 24  ALT 18  ALKPHOS 58  BILITOT 1.2  PROT 5.6*  ALBUMIN 3.0*   No results for input(s): "LIPASE", "AMYLASE" in the last 168 hours. No results for input(s): "AMMONIA" in the last 168 hours. Coagulation Profile: Recent Labs  Lab 02/01/24 0557 02/03/24 0640 02/04/24 0727  INR 1.7* 1.4* 1.4*   Cardiac Enzymes: Recent Labs  Lab 01/31/24 1638  CKTOTAL 121   Recent Results (from the past 240 hours)  Surgical PCR screen     Status: Abnormal   Collection Time: 01/31/24 10:04 AM   Specimen: Nasal Mucosa; Nasal Swab  Result Value Ref Range Status   MRSA, PCR NEGATIVE NEGATIVE Final   Staphylococcus aureus POSITIVE (A) NEGATIVE Final    Comment: (NOTE) The Xpert SA Assay (FDA approved for NASAL specimens in patients 41 years of age and older), is one component of a comprehensive surveillance program. It is not intended to diagnose infection nor to guide or monitor treatment. Performed at [facility], [address] [place], [place] [address]     Radiology Studies: No results found.       Scheduled Meds:  aspirin  EC  81 mg Oral BID   docusate sodium   100 mg Oral BID   ezetimibe   10 mg Oral Daily   folic acid   2 mg Oral Daily   levothyroxine   50 mcg Oral Daily   methotrexate   17.5 mg Oral Weekly   metoprolol  tartrate  50 mg Oral BID   mupirocin  ointment  1 Application Nasal BID   pantoprazole   40 mg Oral Daily   senna-docusate  2 tablet Oral BID   topiramate   100 mg Oral BID   Continuous Infusions:    LOS: 5 days   Time spent:  Haydee Lipa, DO [facility]  If 7PM-7AM, please contact night-coverage [email or web address]  02/05/2024, 7:33 AM

## 2024-02-05 NOTE — TOC Progression Note (Addendum)
 Transition of Care [facility]) - Progression Note    Patient Details  Name: [name] MRN: [id number] Date of Birth: [birth year]  [facility]) CM/SW Contact  Elspeth Hals, LCSW Phone Number: 02/05/2024, 10:37 AM  Clinical Narrative:   Bed offers provided to pt on medicare choice document.  She will review.   1140: CSW spoke with pt with her sister on speakerphone, discussed bed offers, they want to accept offer at [facility].  CSW message with Nikki/[facility]: they do not have available bed today, potentially tomorrow.  Auth request submitted by CMA/Angela and approved:  4/21 - 6/44 Cert#[id number] .     Expected Discharge Plan: Skilled Nursing Facility Barriers to Discharge: Continued Medical Work up, English as a second language teacher, SNF Pending bed offer  Expected Discharge Plan and Services In-house Referral: Clinical Social Work   Post Acute Care Choice: Skilled Nursing Facility Living arrangements for the past 2 months: Single Family Home                                       Social Determinants of Health (SDOH) Interventions SDOH Screenings   Food Insecurity: No Food Insecurity (02/02/2024)  Housing: Low Risk  (02/02/2024)  Transportation Needs: No Transportation Needs (02/02/2024)  Utilities: Not At Risk (02/02/2024)  Alcohol Screen: Low Risk  (01/02/2024)  Depression (PHQ2-9): Low Risk  (01/02/2024)  Financial Resource Strain: Low Risk  (01/02/2024)  Physical Activity: Insufficiently Active (01/02/2024)  Social Connections: Moderately Integrated (02/02/2024)  Stress: No Stress Concern Present (01/02/2024)  Tobacco Use: Low Risk  (02/01/2024)  Health Literacy: Adequate Health Literacy (01/02/2024)    Readmission Risk Interventions     No data to display

## 2024-02-06 DIAGNOSIS — Z9181 History of falling: Secondary | ICD-10-CM | POA: Diagnosis not present

## 2024-02-06 DIAGNOSIS — R2681 Unsteadiness on feet: Secondary | ICD-10-CM | POA: Diagnosis not present

## 2024-02-06 DIAGNOSIS — S72001A Fracture of unspecified part of neck of right femur, initial encounter for closed fracture: Secondary | ICD-10-CM | POA: Diagnosis not present

## 2024-02-06 DIAGNOSIS — L89153 Pressure ulcer of sacral region, stage 3: Secondary | ICD-10-CM | POA: Diagnosis not present

## 2024-02-06 DIAGNOSIS — I4891 Unspecified atrial fibrillation: Secondary | ICD-10-CM | POA: Diagnosis not present

## 2024-02-06 DIAGNOSIS — M1990 Unspecified osteoarthritis, unspecified site: Secondary | ICD-10-CM | POA: Diagnosis not present

## 2024-02-06 DIAGNOSIS — D849 Immunodeficiency, unspecified: Secondary | ICD-10-CM | POA: Diagnosis not present

## 2024-02-06 DIAGNOSIS — D84821 Immunodeficiency due to drugs: Secondary | ICD-10-CM | POA: Diagnosis not present

## 2024-02-06 DIAGNOSIS — W19XXXA Unspecified fall, initial encounter: Secondary | ICD-10-CM | POA: Diagnosis not present

## 2024-02-06 DIAGNOSIS — J309 Allergic rhinitis, unspecified: Secondary | ICD-10-CM | POA: Diagnosis not present

## 2024-02-06 DIAGNOSIS — R1310 Dysphagia, unspecified: Secondary | ICD-10-CM | POA: Diagnosis not present

## 2024-02-06 DIAGNOSIS — H3530 Unspecified macular degeneration: Secondary | ICD-10-CM | POA: Diagnosis not present

## 2024-02-06 DIAGNOSIS — M069 Rheumatoid arthritis, unspecified: Secondary | ICD-10-CM | POA: Diagnosis not present

## 2024-02-06 DIAGNOSIS — I10 Essential (primary) hypertension: Secondary | ICD-10-CM | POA: Diagnosis not present

## 2024-02-06 DIAGNOSIS — G250 Essential tremor: Secondary | ICD-10-CM | POA: Diagnosis not present

## 2024-02-06 DIAGNOSIS — R296 Repeated falls: Secondary | ICD-10-CM | POA: Diagnosis not present

## 2024-02-06 DIAGNOSIS — E785 Hyperlipidemia, unspecified: Secondary | ICD-10-CM | POA: Diagnosis not present

## 2024-02-06 DIAGNOSIS — Z86718 Personal history of other venous thrombosis and embolism: Secondary | ICD-10-CM | POA: Diagnosis not present

## 2024-02-06 DIAGNOSIS — R519 Headache, unspecified: Secondary | ICD-10-CM | POA: Diagnosis not present

## 2024-02-06 DIAGNOSIS — K2950 Unspecified chronic gastritis without bleeding: Secondary | ICD-10-CM | POA: Diagnosis not present

## 2024-02-06 DIAGNOSIS — N179 Acute kidney failure, unspecified: Secondary | ICD-10-CM | POA: Diagnosis not present

## 2024-02-06 DIAGNOSIS — M6281 Muscle weakness (generalized): Secondary | ICD-10-CM | POA: Diagnosis not present

## 2024-02-06 DIAGNOSIS — E039 Hypothyroidism, unspecified: Secondary | ICD-10-CM | POA: Diagnosis not present

## 2024-02-06 DIAGNOSIS — I498 Other specified cardiac arrhythmias: Secondary | ICD-10-CM | POA: Diagnosis not present

## 2024-02-06 DIAGNOSIS — Z743 Need for continuous supervision: Secondary | ICD-10-CM | POA: Diagnosis not present

## 2024-02-06 DIAGNOSIS — Y92009 Unspecified place in unspecified non-institutional (private) residence as the place of occurrence of the external cause: Secondary | ICD-10-CM | POA: Diagnosis not present

## 2024-02-06 DIAGNOSIS — M0680 Other specified rheumatoid arthritis, unspecified site: Secondary | ICD-10-CM | POA: Diagnosis not present

## 2024-02-06 DIAGNOSIS — I495 Sick sinus syndrome: Secondary | ICD-10-CM | POA: Diagnosis not present

## 2024-02-06 DIAGNOSIS — R2689 Other abnormalities of gait and mobility: Secondary | ICD-10-CM | POA: Diagnosis not present

## 2024-02-06 DIAGNOSIS — K219 Gastro-esophageal reflux disease without esophagitis: Secondary | ICD-10-CM | POA: Diagnosis not present

## 2024-02-06 DIAGNOSIS — M5431 Sciatica, right side: Secondary | ICD-10-CM | POA: Diagnosis not present

## 2024-02-06 DIAGNOSIS — S72141D Displaced intertrochanteric fracture of right femur, subsequent encounter for closed fracture with routine healing: Secondary | ICD-10-CM | POA: Diagnosis not present

## 2024-02-06 DIAGNOSIS — S72141A Displaced intertrochanteric fracture of right femur, initial encounter for closed fracture: Secondary | ICD-10-CM | POA: Diagnosis not present

## 2024-02-06 MED ORDER — SENNOSIDES-DOCUSATE SODIUM 8.6-50 MG PO TABS: 2.0000 | ORAL_TABLET | Freq: Two times a day (BID) | ORAL | 0 refills | Status: AC

## 2024-02-06 MED ORDER — ASPIRIN 81 MG PO TBEC: 81.0000 mg | DELAYED_RELEASE_TABLET | Freq: Two times a day (BID) | ORAL | 0 refills | Status: DC

## 2024-02-06 MED ORDER — POLYETHYLENE GLYCOL 3350 17 G PO PACK: 17.0000 g | PACK | Freq: Two times a day (BID) | ORAL | 0 refills | Status: AC

## 2024-02-06 NOTE — TOC Transition Note (Signed)
 Transition of Care [facility]) - Discharge Note   Patient Details  Name: [name] MRN: [id number] Date of Birth: 05-24-1946  Transition [facility] Care [facility]) CM/SW Contact:  Elspeth Hals, LCSW Phone Number: 02/06/2024, 12:53 PM   Clinical Narrative:   Pt discharging to [facility] room 425.  RN call report to [phone].  PTAR called [phone].     Final next level of care: Skilled Nursing Facility Barriers to Discharge: Barriers Resolved   Patient Goals and CMS Choice Patient states their goals for this hospitalization and ongoing recovery are:: Rehab CMS Medicare.gov Compare Post Acute Care list provided to:: Patient Choice offered to / list presented to : Patient [facility] ownership interest in [facility].provided to:: Patient    Discharge Placement              Patient chooses bed at: [facility] Patient to be transferred to facility by: ptar Name of family member notified: daughter Prentice Brochure Patient and family notified of of transfer: 02/06/24  Discharge Plan and Services Additional resources added to the After Visit Summary for   In-house Referral: Clinical Social Work   Post Acute Care Choice: Skilled Nursing Facility                               Social Drivers of Health (SDOH) Interventions SDOH Screenings   Food Insecurity: No Food Insecurity (02/02/2024)  Housing: Low Risk  (02/02/2024)  Transportation Needs: No Transportation Needs (02/02/2024)  Utilities: Not At Risk (02/02/2024)  Alcohol Screen: Low Risk  (01/02/2024)  Depression (PHQ2-9): Low Risk  (01/02/2024)  Financial Resource Strain: Low Risk  (01/02/2024)  Physical Activity: Insufficiently Active (01/02/2024)  Social Connections: Moderately Integrated (02/02/2024)  Stress: No Stress Concern Present (01/02/2024)  Tobacco Use: Low Risk  (02/01/2024)  Health Literacy: Adequate Health Literacy (01/02/2024)     Readmission Risk Interventions     No data to display

## 2024-02-06 NOTE — Discharge Summary (Signed)
 Physician Discharge Summary  ATHELENE HURSEY NFA:[id number] DOB: 06-Apr-1946 DOA: 01/30/2024  PCP: Roslyn Coombe, MD  Admit date: 01/30/2024 Discharge date: 02/06/2024  Admitted From: Home Disposition: If  Recommendations for Outpatient Follow-up:  Follow up with PCP in 1-2 weeks Follow-up with orthopedic surgery as scheduled  Discharge Condition: Stable CODE STATUS: Full Diet recommendation: As tolerated  Brief/Interim Summary: [name] is a 78 y.o. female with medical history significant of rheumatoid arthritis, essential tremors.  Patient presents with right hip pain after mechanical fall, imaging confirms right proximal intertrochanteric femur fracture.  Hospitalist called for admission, orthopedics called in consult.   Patient remains medically stable for discharge, insurance approved SNF, plan for discharge to facility later today  Discharge Diagnoses:  Principal Problem:   Hip fracture [facility]) Active Problems:   Fall at home, initial encounter   Essential tremor  Right intertrochanteric femur fracture, POA Fall at home, initial encounter Unclear etiology, no notable loss of consciousness or syncope  Likely mechanical  Pain currently well-controlled Ortho following along, ORIF 02/01/2024 with Dr. Rozelle Corning -tolerated well PT OT following, dispo to SNF as above DVT prophylaxis and pain per orthopedic (twice daily aspirin , oxycodone )   AKI, resolved Perioperatively - increase PO intake as appropriate   Elevated INR, improving Unclear etiology, repeat labs downtrending appropriately Repeat with PCP in the next 1 to 2 months   Essential tremor, chronic Continue home topamax    Hypothyroidism Continue levothyroxine    Rheumatoid arthritis, chronic Continue methotrexate , folic acid , LFTs within normal limits   HTN (hypertension) - Borderline hypotension - Continue metoprolol  only, discontinue lisinopril  HCTZ given ongoing normotensive/borderline  hypotensive  Discharge Instructions  Discharge Instructions     Call MD for:  difficulty breathing, headache or visual disturbances   Complete by: As directed    Call MD for:  extreme fatigue   Complete by: As directed    Call MD for:  hives   Complete by: As directed    Call MD for:  persistant dizziness or light-headedness   Complete by: As directed    Call MD for:  persistant nausea and vomiting   Complete by: As directed    Call MD for:  redness, tenderness, or signs of infection (pain, swelling, redness, odor or green/yellow discharge around incision site)   Complete by: As directed    Call MD for:  severe uncontrolled pain   Complete by: As directed    Call MD for:  temperature >100.4   Complete by: As directed    Diet - low sodium heart healthy   Complete by: As directed    Increase activity slowly   Complete by: As directed       Allergies as of 02/06/2024       Reactions   Allegra  [fexofenadine ] Swelling   Lip/ eyes swelling   Atenolol Other (See Comments)   Increased BP   Ciprofloxacin Swelling   Tongue and lip swelling   Codeine Nausea And Vomiting, Swelling, Other (See Comments)   Swelling of eyes   Cortisone Swelling   Glucocorticoids specifically: (injection) causes swelling of face   Doxycycline Other (See Comments)   Per pt: unknown   Fosamax [alendronate] Other (See Comments)   Gi upset   Hydrochlorothiazide  Other (See Comments)   Low sodium   Klonopin  [clonazepam ] Other (See Comments)   Urinary retention   Lisinopril  Other (See Comments)   05/07/14 lower lip paresthesia   Prednisone  Swelling   Ramipril Other (See Comments)   Increases  BP   Sulfa Antibiotics Swelling   Sulfamethoxazole-trimethoprim Other (See Comments)    Unknown per patient   Tizanidine  Other (See Comments)   Dizziness   Verapamil Other (See Comments)   Junctional bradycardia which resolved after stopping medication   Chocolate Rash        Medication List     STOP  taking these medications    aspirin  81 MG tablet Replaced by: aspirin  EC 81 MG tablet   polyethylene glycol powder 17 GM/SCOOP powder Commonly known as: GLYCOLAX /MIRALAX  Replaced by: polyethylene glycol 17 g packet       TAKE these medications    ascorbic acid 500 MG tablet Commonly known as: VITAMIN C Take 1,000 mg by mouth daily.   aspirin  EC 81 MG tablet Take 1 tablet (81 mg total) by mouth 2 (two) times daily. Swallow whole. Replaces: aspirin  81 MG tablet   B-12 PO Take 1 tablet by mouth daily.   ezetimibe  10 MG tablet Commonly known as: Zetia  Take 1 tablet (10 mg total) by mouth daily.   FISH OIL PO Take 1 capsule by mouth daily.   folic acid  1 MG tablet Commonly known as: FOLVITE  Take 2 mg by mouth daily.   levothyroxine  50 MCG tablet Commonly known as: SYNTHROID  Take 1 tablet (50 mcg total) by mouth daily.   MAGNESIUM PO Take 2 tablets by mouth daily.   methotrexate  2.5 MG tablet Commonly known as: RHEUMATREX Take 17.5 tablets by mouth once a week.   metoprolol  tartrate 50 MG tablet Commonly known as: LOPRESSOR  TAKE 1 TABLET BY MOUTH TWICE A DAY   oxyCODONE  5 MG immediate release tablet Commonly known as: Oxy IR/ROXICODONE  Take 1 tablet (5 mg total) by mouth every 4 (four) hours as needed for moderate pain (pain score 4-6).   pantoprazole  40 MG tablet Commonly known as: PROTONIX  TAKE 1 TABLET BY MOUTH EVERY DAY   polyethylene glycol 17 g packet Commonly known as: MIRALAX  / GLYCOLAX  Take 17 g by mouth 2 (two) times daily. Replaces: polyethylene glycol powder 17 GM/SCOOP powder   senna-docusate 8.6-50 MG tablet Commonly known as: Senokot-S Take 2 tablets by mouth 2 (two) times daily.   topiramate  100 MG tablet Commonly known as: TOPAMAX  TAKE 1 TABLET BY MOUTH TWICE A DAY   Vitamin D  125 MCG (5000 UT) Caps Take 5,000 Units by mouth daily.        Allergies  Allergen Reactions   Allegra  [Fexofenadine ] Swelling    Lip/ eyes swelling    Atenolol Other (See Comments)    Increased BP   Ciprofloxacin Swelling    Tongue and lip swelling   Codeine Nausea And Vomiting, Swelling and Other (See Comments)    Swelling of eyes   Cortisone Swelling    Glucocorticoids specifically: (injection) causes swelling of face    Doxycycline Other (See Comments)    Per pt: unknown   Fosamax [Alendronate] Other (See Comments)    Gi upset   Hydrochlorothiazide  Other (See Comments)    Low sodium   Klonopin  [Clonazepam ] Other (See Comments)    Urinary retention   Lisinopril  Other (See Comments)    05/07/14 lower lip paresthesia   Prednisone  Swelling   Ramipril Other (See Comments)    Increases BP   Sulfa Antibiotics Swelling   Sulfamethoxazole-Trimethoprim Other (See Comments)     Unknown per patient   Tizanidine  Other (See Comments)    Dizziness    Verapamil Other (See Comments)    Junctional bradycardia which resolved after  stopping medication   Chocolate Rash    Consultations: Orthopedic surgery  Procedures/Studies: ECHOCARDIOGRAM COMPLETE Result Date: 02/01/2024    ECHOCARDIOGRAM REPORT   Patient Name:   TESSICA CUPO Date of Exam: 02/01/2024 Medical Rec #:  [id number]          Height:       60.0 in Accession #:    [id number]         Weight:       118.0 lb Date of Birth:  28-Aug-1946          BSA:          1.492 m Patient Age:    77 years           BP:           138/59 mmHg Patient Gender: F                  HR:           62 bpm. Exam Location:  Inpatient Procedure: 2D Echo, Color Doppler and Cardiac Doppler (Both Spectral and Color            Flow Doppler were utilized during procedure). Indications:    Elevated Troponin  History:        Patient has no prior history of Echocardiogram examinations.  Sonographer:    Hersey Lorenzo RDCS Referring Phys: [facility] GOEL IMPRESSIONS  1. Left ventricular ejection fraction, by estimation, is 60 to 65%. The left ventricle has normal function. The left ventricle has no regional wall motion  abnormalities. Left ventricular diastolic parameters are consistent with Grade I diastolic dysfunction (impaired relaxation).  2. Right ventricular systolic function is normal. The right ventricular size is normal.  3. The mitral valve is normal in structure. No evidence of mitral valve regurgitation. No evidence of mitral stenosis.  4. The aortic valve is tricuspid. There is mild calcification of the aortic valve. Aortic valve regurgitation is mild. Aortic valve sclerosis/calcification is present, without any evidence of aortic stenosis.  5. The inferior vena cava is normal in size with greater than 50% respiratory variability, suggesting right atrial pressure of 3 mmHg. FINDINGS  Left Ventricle: Left ventricular ejection fraction, by estimation, is 60 to 65%. The left ventricle has normal function. The left ventricle has no regional wall motion abnormalities. The left ventricular internal cavity size was normal in size. There is  no left ventricular hypertrophy. Left ventricular diastolic parameters are consistent with Grade I diastolic dysfunction (impaired relaxation). Right Ventricle: The right ventricular size is normal. No increase in right ventricular wall thickness. Right ventricular systolic function is normal. Left Atrium: Left atrial size was normal in size. Right Atrium: Right atrial size was normal in size. Pericardium: There is no evidence of pericardial effusion. Mitral Valve: The mitral valve is normal in structure. No evidence of mitral valve regurgitation. No evidence of mitral valve stenosis. Tricuspid Valve: The tricuspid valve is normal in structure. Tricuspid valve regurgitation is mild . No evidence of tricuspid stenosis. Aortic Valve: The aortic valve is tricuspid. There is mild calcification of the aortic valve. Aortic valve regurgitation is mild. Aortic valve sclerosis/calcification is present, without any evidence of aortic stenosis. Pulmonic Valve: The pulmonic valve was normal in  structure. Pulmonic valve regurgitation is mild. No evidence of pulmonic stenosis. Aorta: The aortic root is normal in size and structure. Venous: The inferior vena cava is normal in size with greater than 50% respiratory variability, suggesting right atrial pressure of 3 mmHg.  IAS/Shunts: No atrial level shunt detected by color flow Doppler.  LEFT VENTRICLE PLAX 2D LVIDd:         4.30 cm   Diastology LVIDs:         3.00 cm   LV e' medial:    6.09 cm/s LV PW:         1.00 cm   LV E/e' medial:  12.0 LV IVS:        1.00 cm   LV e' lateral:   8.16 cm/s LVOT diam:     1.70 cm   LV E/e' lateral: 8.9 LV SV:         52 LV SV Index:   35 LVOT Area:     2.27 cm  RIGHT VENTRICLE         IVC TAPSE (M-mode): 1.8 cm  IVC diam: 1.40 cm LEFT ATRIUM             Index        RIGHT ATRIUM           Index LA diam:        3.10 cm 2.08 cm/m   RA Area:     12.90 cm LA Vol (A2C):   38.4 ml 25.74 ml/m  RA Volume:   28.80 ml  19.31 ml/m LA Vol (A4C):   25.7 ml 17.23 ml/m LA Biplane Vol: 33.0 ml 22.12 ml/m  AORTIC VALVE LVOT Vmax:   104.00 cm/s LVOT Vmean:  62.100 cm/s LVOT VTI:    0.229 m  AORTA Ao Root diam: 2.90 cm Ao Asc diam:  3.50 cm MITRAL VALVE MV Area (PHT): 2.69 cm    SHUNTS MV Decel Time: 282 msec    Systemic VTI:  0.23 m MV E velocity: 72.80 cm/s  Systemic Diam: 1.70 cm MV A velocity: 89.10 cm/s MV E/A ratio:  0.82 Jules Oar MD Electronically signed by Jules Oar MD Signature Date/Time: 02/01/2024/2:20:41 PM    Final    DG FEMUR PORT, MIN 2 VIEWS RIGHT Result Date: 02/01/2024 CLINICAL DATA:  Status post right hip fracture fixation. EXAM: RIGHT FEMUR PORTABLE 2 VIEW COMPARISON:  Preoperative imaging FINDINGS: Femoral intramedullary nail with trans trochanteric screw fixation traverse proximal femur fracture. Recent postsurgical change includes air and edema in the soft tissues. IMPRESSION: ORIF of proximal femur fracture. Electronically Signed   By: Chadwick Colonel M.D.   On: 02/01/2024 13:06   DG FEMUR,  MIN 2 VIEWS RIGHT Result Date: 02/01/2024 CLINICAL DATA:  Elective surgery. EXAM: RIGHT FEMUR 2 VIEWS COMPARISON:  Preoperative exam FINDINGS: Eight fluoroscopic spot views of the right femur submitted from the operating room. Femoral intramedullary nail with trans trochanteric screw fixation traversing proximal femur fracture. Fluoroscopy time 47 seconds. Dose 7.1 mGy. IMPRESSION: Intraoperative fluoroscopy during proximal femur fracture fixation. Electronically Signed   By: Chadwick Colonel M.D.   On: 02/01/2024 09:08   DG C-Arm 1-60 Min-No Report Result Date: 02/01/2024 Fluoroscopy was utilized by the requesting physician.  No radiographic interpretation.   DG C-Arm 1-60 Min-No Report Result Date: 02/01/2024 Fluoroscopy was utilized by the requesting physician.  No radiographic interpretation.   DG CHEST PORT 1 VIEW Result Date: 01/31/2024 CLINICAL DATA:  Fall.  Preoperative exam. EXAM: PORTABLE CHEST 1 VIEW COMPARISON:  Chest radiograph dated 07/27/2022. FINDINGS: The heart size and mediastinal contours are within normal limits. Aortic atherosclerosis. No focal consolidation, pleural effusion, or pneumothorax. No acute osseous abnormality identified. IMPRESSION: 1. No acute cardiopulmonary findings. 2.  Aortic Atherosclerosis (ICD10-I70.0).  Electronically Signed   By: Mannie Seek M.D.   On: 01/31/2024 16:24   CT HIP RIGHT WO CONTRAST Result Date: 01/31/2024 CLINICAL DATA:  Right hip trauma after fall. EXAM: CT OF THE RIGHT HIP WITHOUT CONTRAST TECHNIQUE: Multidetector CT imaging of the right hip was performed according to the standard protocol. Multiplanar CT image reconstructions were also generated. RADIATION DOSE REDUCTION: This exam was performed according to the departmental dose-optimization program which includes automated exposure control, adjustment of the mA and/or kV according to patient size and/or use of iterative reconstruction technique. COMPARISON:  Right hip radiographs dated  01/30/2024. FINDINGS: Bones/Joint/Cartilage Oblique fracture of the right proximal femur extending proximally through the subcapital femoral head anteriorly with intertrochanteric extension distally and posteriorly. Fracture margins extend to the posterior facet of the greater trochanter and through the lesser trochanter. There is approximately 4 cm of impaction and 6 mm of posterior displacement at the level of the posterior facet of the greater trochanter. The right femoral head is seated within the acetabulum. The visualized right sacroiliac joint is intact. Ligaments Ligaments are suboptimally evaluated by CT. Soft tissue and Muscles No discrete soft tissue and intramuscular fluid collection. IMPRESSION: Acute impacted and displaced oblique fracture of the right proximal femur involving the subcapital femoral head anteriorly with intertrochanteric extension. Electronically Signed   By: Mannie Seek M.D.   On: 01/31/2024 16:22   DG Hip Unilat  With Pelvis 2-3 Views Right Result Date: 01/30/2024 CLINICAL DATA:  Fall, right hip pain EXAM: DG HIP (WITH OR WITHOUT PELVIS) 2-3V RIGHT COMPARISON:  09/30/2016 FINDINGS: Right proximal femoral intertrochanteric fracture, with deformity observed and cortical discontinuity along the lesser trochanter favoring intertrochanteric fracture over basicervical femoral neck fracture. No other fracture identified. Spurring and intervertebral space narrowing at L4-5 and L5-S1. IMPRESSION: 1. Right proximal femoral intertrochanteric fracture. 2. Degenerative disc disease at L4-5 and L5-S1. Electronically Signed   By: Freida Jes M.D.   On: 01/30/2024 18:45     Subjective: No acute issues or events overnight denies nausea vomiting diarrhea constipation any fevers chills chest pain   Discharge Exam: Vitals:   02/06/24 0448 02/06/24 0743  BP: (!) 150/69 (!) 165/82  Pulse: 69 72  Resp: 16 17  Temp: 98 F (36.7 C) 98.5 F (36.9 C)  SpO2: 97% 100%   Vitals:    02/05/24 1415 02/05/24 2038 02/06/24 0448 02/06/24 0743  BP: 116/62 (!) 118/93 (!) 150/69 (!) 165/82  Pulse: 70 73 69 72  Resp:  16 16 17   Temp: 98.1 F (36.7 C) 98.5 F (36.9 C) 98 F (36.7 C) 98.5 F (36.9 C)  TempSrc: Oral Oral Oral   SpO2: 99% 99% 97% 100%  Weight:      Height:        General: Pt is alert, awake, not in acute distress Cardiovascular: RRR, S1/S2 +, no rubs, no gallops Respiratory: CTA bilaterally, no wheezing, no rhonchi Abdominal: Soft, NT, ND, bowel sounds + Extremities: no edema, no cyanosis   The results of significant diagnostics from this hospitalization (including imaging, microbiology, ancillary and laboratory) are listed below for reference.     Microbiology: Recent Results (from the past 240 hours)  Surgical PCR screen     Status: Abnormal   Collection Time: 01/31/24 10:04 AM   Specimen: Nasal Mucosa; Nasal Swab  Result Value Ref Range Status   MRSA, PCR NEGATIVE NEGATIVE Final   Staphylococcus aureus POSITIVE (A) NEGATIVE Final    Comment: (NOTE) The Xpert SA Assay (FDA  approved for NASAL specimens in patients 1 years of age and older), is one component of a comprehensive surveillance program. It is not intended to diagnose infection nor to guide or monitor treatment. Performed at [facility], [address] [place], [place] [address]      Labs:  Basic Metabolic Panel: Recent Labs  Lab 01/30/24 1656 01/31/24 1219 02/01/24 0557 02/02/24 0619 02/04/24 0727  NA 136 135 136 133* 137  K 3.4* 3.1* 3.8 3.5 3.8  CL 106 108 108 105 108  CO2 19* 18* 20* 18* 22  GLUCOSE 104* 119* 100* 116* 105*  BUN 29* 22 27* 41* 23  CREATININE 0.76 0.84 0.85 1.29* 0.70  CALCIUM 9.1 8.8* 8.9 8.6* 8.6*   Liver Function Tests: Recent Labs  Lab 01/31/24 1219  AST 24  ALT 18  ALKPHOS 58  BILITOT 1.2  PROT 5.6*  ALBUMIN 3.0*   CBC: Recent Labs  Lab 01/30/24 1656 01/31/24 1219 02/01/24 0557 02/02/24 0619 02/04/24 0727  WBC  11.1* 7.4 6.8 5.8 4.3  HGB 12.1 10.8* 11.1* 9.6* 9.1*  HCT 34.0* 31.8* 31.3* 28.4* 26.8*  MCV 98.8 99.4 98.1 100.0 100.4*  PLT 216 189 169 148* 199   Cardiac Enzymes: Recent Labs  Lab 01/31/24 1638  CKTOTAL 121   Urinalysis    Component Value Date/Time   COLORURINE YELLOW 11/20/2023 0840   APPEARANCEUR CLEAR 11/20/2023 0840   LABSPEC 1.010 11/20/2023 0840   PHURINE 6.5 11/20/2023 0840   GLUCOSEU NEGATIVE 11/20/2023 0840   HGBUR NEGATIVE 11/20/2023 0840   BILIRUBINUR NEGATIVE 11/20/2023 0840   BILIRUBINUR negative 09/27/2023 1552   KETONESUR NEGATIVE 11/20/2023 0840   PROTEINUR Negative 09/27/2023 1552   PROTEINUR NEGATIVE 05/17/2022 0120   UROBILINOGEN 0.2 11/20/2023 0840   NITRITE NEGATIVE 11/20/2023 0840   LEUKOCYTESUR MODERATE (A) 11/20/2023 0840   Sepsis Labs Recent Labs  Lab 01/31/24 1219 02/01/24 0557 02/02/24 0619 02/04/24 0727  WBC 7.4 6.8 5.8 4.3   Microbiology Recent Results (from the past 240 hours)  Surgical PCR screen     Status: Abnormal   Collection Time: 01/31/24 10:04 AM   Specimen: Nasal Mucosa; Nasal Swab  Result Value Ref Range Status   MRSA, PCR NEGATIVE NEGATIVE Final   Staphylococcus aureus POSITIVE (A) NEGATIVE Final    Comment: (NOTE) The Xpert SA Assay (FDA approved for NASAL specimens in patients 6 years of age and older), is one component of a comprehensive surveillance program. It is not intended to diagnose infection nor to guide or monitor treatment. Performed at [facility], [address] [place], [place] [address]      Time coordinating discharge: Over 30 minutes  SIGNED:   Haydee Lipa, DO [facility] 02/06/2024, 12:10 PM Pager   If 7PM-7AM, please contact night-coverage [email or web address]

## 2024-02-06 NOTE — Discharge Instructions (Addendum)
 Continue Aspirin  81mg  by mouth twice daily for 21 days through 02/21/24, then resume previously prescribed dose of 81mg  by mouth every other day starting 5/9.

## 2024-02-06 NOTE — Progress Notes (Signed)
 Notified by pharmacy, after patient discharged, of change to patient's aspirin  regimen. Writer spoke with Marlyse Single at [facility] to update with changes (BID for 21 days, then every other day beginning 5/9).

## 2024-02-06 NOTE — Progress Notes (Signed)
 Report given to Amy at [facility]. Patient waiting for PTAR.

## 2024-02-08 DIAGNOSIS — D84821 Immunodeficiency due to drugs: Secondary | ICD-10-CM | POA: Diagnosis not present

## 2024-02-08 DIAGNOSIS — R2689 Other abnormalities of gait and mobility: Secondary | ICD-10-CM | POA: Diagnosis not present

## 2024-02-08 DIAGNOSIS — Z9181 History of falling: Secondary | ICD-10-CM | POA: Diagnosis not present

## 2024-02-08 DIAGNOSIS — D849 Immunodeficiency, unspecified: Secondary | ICD-10-CM | POA: Diagnosis not present

## 2024-02-08 DIAGNOSIS — S72141D Displaced intertrochanteric fracture of right femur, subsequent encounter for closed fracture with routine healing: Secondary | ICD-10-CM | POA: Diagnosis not present

## 2024-02-08 DIAGNOSIS — M6281 Muscle weakness (generalized): Secondary | ICD-10-CM | POA: Diagnosis not present

## 2024-02-08 DIAGNOSIS — M0680 Other specified rheumatoid arthritis, unspecified site: Secondary | ICD-10-CM | POA: Diagnosis not present

## 2024-02-12 DIAGNOSIS — Z9181 History of falling: Secondary | ICD-10-CM | POA: Diagnosis not present

## 2024-02-12 DIAGNOSIS — D84821 Immunodeficiency due to drugs: Secondary | ICD-10-CM | POA: Diagnosis not present

## 2024-02-12 DIAGNOSIS — L89153 Pressure ulcer of sacral region, stage 3: Secondary | ICD-10-CM | POA: Diagnosis not present

## 2024-02-12 DIAGNOSIS — S72141D Displaced intertrochanteric fracture of right femur, subsequent encounter for closed fracture with routine healing: Secondary | ICD-10-CM | POA: Diagnosis not present

## 2024-02-12 DIAGNOSIS — R2689 Other abnormalities of gait and mobility: Secondary | ICD-10-CM | POA: Diagnosis not present

## 2024-02-12 DIAGNOSIS — D849 Immunodeficiency, unspecified: Secondary | ICD-10-CM | POA: Diagnosis not present

## 2024-02-12 DIAGNOSIS — M6281 Muscle weakness (generalized): Secondary | ICD-10-CM | POA: Diagnosis not present

## 2024-02-12 DIAGNOSIS — M0680 Other specified rheumatoid arthritis, unspecified site: Secondary | ICD-10-CM | POA: Diagnosis not present

## 2024-02-14 ENCOUNTER — Encounter: Payer: Self-pay | Admitting: Orthopedic Surgery

## 2024-02-14 ENCOUNTER — Other Ambulatory Visit (INDEPENDENT_AMBULATORY_CARE_PROVIDER_SITE_OTHER): Payer: Self-pay

## 2024-02-14 ENCOUNTER — Ambulatory Visit (INDEPENDENT_AMBULATORY_CARE_PROVIDER_SITE_OTHER): Admitting: Orthopedic Surgery

## 2024-02-14 DIAGNOSIS — R296 Repeated falls: Secondary | ICD-10-CM | POA: Diagnosis not present

## 2024-02-14 DIAGNOSIS — S72141A Displaced intertrochanteric fracture of right femur, initial encounter for closed fracture: Secondary | ICD-10-CM | POA: Diagnosis not present

## 2024-02-14 DIAGNOSIS — N179 Acute kidney failure, unspecified: Secondary | ICD-10-CM | POA: Diagnosis not present

## 2024-02-14 DIAGNOSIS — S72001A Fracture of unspecified part of neck of right femur, initial encounter for closed fracture: Secondary | ICD-10-CM

## 2024-02-14 DIAGNOSIS — E039 Hypothyroidism, unspecified: Secondary | ICD-10-CM | POA: Diagnosis not present

## 2024-02-14 DIAGNOSIS — M069 Rheumatoid arthritis, unspecified: Secondary | ICD-10-CM | POA: Diagnosis not present

## 2024-02-14 DIAGNOSIS — I10 Essential (primary) hypertension: Secondary | ICD-10-CM | POA: Diagnosis not present
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
## 2024-02-15 DIAGNOSIS — M6281 Muscle weakness (generalized): Secondary | ICD-10-CM | POA: Diagnosis not present

## 2024-02-15 DIAGNOSIS — M0680 Other specified rheumatoid arthritis, unspecified site: Secondary | ICD-10-CM | POA: Diagnosis not present

## 2024-02-15 DIAGNOSIS — D849 Immunodeficiency, unspecified: Secondary | ICD-10-CM | POA: Diagnosis not present

## 2024-02-15 DIAGNOSIS — R2689 Other abnormalities of gait and mobility: Secondary | ICD-10-CM | POA: Diagnosis not present

## 2024-02-15 DIAGNOSIS — S72141D Displaced intertrochanteric fracture of right femur, subsequent encounter for closed fracture with routine healing: Secondary | ICD-10-CM | POA: Diagnosis not present

## 2024-02-15 DIAGNOSIS — D84821 Immunodeficiency due to drugs: Secondary | ICD-10-CM | POA: Diagnosis not present

## 2024-02-15 DIAGNOSIS — Z9181 History of falling: Secondary | ICD-10-CM | POA: Diagnosis not present

## 2024-02-19 ENCOUNTER — Ambulatory Visit: Admitting: Internal Medicine

## 2024-02-19 DIAGNOSIS — L89153 Pressure ulcer of sacral region, stage 3: Secondary | ICD-10-CM | POA: Diagnosis not present

## 2024-02-19 DIAGNOSIS — R2681 Unsteadiness on feet: Secondary | ICD-10-CM | POA: Diagnosis not present

## 2024-02-19 DIAGNOSIS — R2689 Other abnormalities of gait and mobility: Secondary | ICD-10-CM | POA: Diagnosis not present

## 2024-02-19 DIAGNOSIS — S72141D Displaced intertrochanteric fracture of right femur, subsequent encounter for closed fracture with routine healing: Secondary | ICD-10-CM | POA: Diagnosis not present

## 2024-02-19 DIAGNOSIS — M6281 Muscle weakness (generalized): Secondary | ICD-10-CM | POA: Diagnosis not present

## 2024-02-20 DIAGNOSIS — S72141A Displaced intertrochanteric fracture of right femur, initial encounter for closed fracture: Secondary | ICD-10-CM | POA: Diagnosis not present

## 2024-02-20 DIAGNOSIS — R2689 Other abnormalities of gait and mobility: Secondary | ICD-10-CM | POA: Diagnosis not present

## 2024-02-20 DIAGNOSIS — E039 Hypothyroidism, unspecified: Secondary | ICD-10-CM | POA: Diagnosis not present

## 2024-02-20 DIAGNOSIS — M069 Rheumatoid arthritis, unspecified: Secondary | ICD-10-CM | POA: Diagnosis not present

## 2024-02-20 DIAGNOSIS — R2681 Unsteadiness on feet: Secondary | ICD-10-CM | POA: Diagnosis not present

## 2024-02-20 DIAGNOSIS — M6281 Muscle weakness (generalized): Secondary | ICD-10-CM | POA: Diagnosis not present

## 2024-02-20 DIAGNOSIS — S72141D Displaced intertrochanteric fracture of right femur, subsequent encounter for closed fracture with routine healing: Secondary | ICD-10-CM | POA: Diagnosis not present

## 2024-02-20 DIAGNOSIS — I10 Essential (primary) hypertension: Secondary | ICD-10-CM | POA: Diagnosis not present

## 2024-02-21 ENCOUNTER — Telehealth: Payer: Self-pay

## 2024-02-21 ENCOUNTER — Other Ambulatory Visit: Payer: Self-pay | Admitting: *Deleted

## 2024-02-21 DIAGNOSIS — I10 Essential (primary) hypertension: Secondary | ICD-10-CM

## 2024-02-21 NOTE — Telephone Encounter (Signed)
 Copied from CRM [id number]. Topic: Clinical - Medical Advice >> Feb 21, 2024  3:26 PM Melissa C wrote: Reason for CRM: patient missed a call today. When I checked her chart it looks as though nursing wanted to discuss plans for aftercare following her emergency room visit. Please advise with patient. Thank you

## 2024-02-21 NOTE — Transitions of Care (Post Inpatient/ED Visit) (Unsigned)
   02/21/2024  Name: [name] MRN: [id number] DOB: December 17, 1945  Today's TOC FU Call Status: Today's TOC FU Call Status:: Unsuccessful Call (1st Attempt) Unsuccessful Call (1st Attempt) Date: 02/21/24  Attempted to reach the patient regarding the most recent Inpatient/ED visit.  Follow Up Plan: Additional outreach attempts will be made to reach the patient to complete the Transitions of Care (Post Inpatient/ED visit) call.   Signature Darrall Ellison, LPN [facility] Nurse Health Advisor Direct Dial [phone]

## 2024-02-21 NOTE — Patient Outreach (Addendum)
 Post- Acute Care Manager follow up. Screening for potential complex care management services as benefit of health plan and primary care provider.  Update from [facility] psychotherapist. Ms. Pusateri discharged from [facility] on yesterday 02/20/24. Her daughter will assist with dressing changes. Ms. Cooke lives alone. Will have [facility] home health will provide PT/OT/Nursing. Has appointment with [facility] on 6/19 that was the earliest appointment available. She was also placed on the cancellation list  Will refer to [facility] CCM team for ongoing complex care management services. Ms. Sietsema has medical history of Afib, HTN, HLD, and sacral wound.   Nolberto Batty, MSN, RN, BSN [place]  [facility] RN Post- Acute Care Manager Direct Dial: [phone]

## 2024-02-22 ENCOUNTER — Telehealth: Payer: Self-pay | Admitting: *Deleted

## 2024-02-22 DIAGNOSIS — I10 Essential (primary) hypertension: Secondary | ICD-10-CM | POA: Diagnosis not present

## 2024-02-22 DIAGNOSIS — W19XXXD Unspecified fall, subsequent encounter: Secondary | ICD-10-CM | POA: Diagnosis not present

## 2024-02-22 DIAGNOSIS — Z9181 History of falling: Secondary | ICD-10-CM | POA: Diagnosis not present

## 2024-02-22 DIAGNOSIS — M069 Rheumatoid arthritis, unspecified: Secondary | ICD-10-CM | POA: Diagnosis not present

## 2024-02-22 DIAGNOSIS — L89152 Pressure ulcer of sacral region, stage 2: Secondary | ICD-10-CM | POA: Diagnosis not present

## 2024-02-22 DIAGNOSIS — R296 Repeated falls: Secondary | ICD-10-CM | POA: Diagnosis not present

## 2024-02-22 DIAGNOSIS — S72141D Displaced intertrochanteric fracture of right femur, subsequent encounter for closed fracture with routine healing: Secondary | ICD-10-CM | POA: Diagnosis not present

## 2024-02-22 DIAGNOSIS — E039 Hypothyroidism, unspecified: Secondary | ICD-10-CM | POA: Diagnosis not present

## 2024-02-22 DIAGNOSIS — G250 Essential tremor: Secondary | ICD-10-CM | POA: Diagnosis not present

## 2024-02-22 DIAGNOSIS — F0390 Unspecified dementia without behavioral disturbance: Secondary | ICD-10-CM | POA: Diagnosis not present

## 2024-02-22 NOTE — Progress Notes (Signed)
 Complex Care Management Note Care Guide Note  02/22/2024 Name: [name] MRN: [id number] DOB: 1946/02/20   Complex Care Management Outreach Attempts: An unsuccessful telephone outreach was attempted today to offer the patient information about available complex care management services.  Follow Up Plan:  Additional outreach attempts will be made to offer the patient complex care management information and services.   Encounter Outcome:  No Answer  Kandis Ormond, CMA [place]  [facility], [facility] Guide Direct Dial: [phone]  Fax: [phone] Website: [email or web address]

## 2024-02-22 NOTE — Transitions of Care (Post Inpatient/ED Visit) (Signed)
 02/22/2024  Name: [name] MRN: [id number] DOB: [birth year]  Today's TOC FU Call Status: Today's TOC FU Call Status:: Successful TOC FU Call Completed Unsuccessful Call (1st Attempt) Date: 02/21/24 Unsuccessful Call (2nd Attempt) Date: 02/22/24 [facility] FU Call Complete Date: 02/22/24 Patient's Name and Date of Birth confirmed.  Transition Care Management Follow-up Telephone Call Date of Discharge: 02/20/24 Discharge Facility: Other ([facility]) Name of Other ([facility]) Discharge Facility: [facility] Type of Discharge: Inpatient Admission Primary Inpatient Discharge Diagnosis:: afib How have you been since you were released from the hospital?: Better Any questions or concerns?: No  Items Reviewed: Did you receive and understand the discharge instructions provided?: Yes Medications obtained,verified, and reconciled?: Yes (Medications Reviewed) Any new allergies since your discharge?: Yes Dietary orders reviewed?: Yes Do you have support at home?: No  Medications Reviewed Today: Medications Reviewed Today     Reviewed by Darrall Ellison, LPN (Licensed Practical Nurse) on 02/22/24 at 1211  Med List Status: <None>   Medication Order Taking? Sig Documenting Provider Last Dose Status Informant  ascorbic acid (VITAMIN C) 500 MG tablet [id number] No Take 1,000 mg by mouth daily. [provider] 01/30/2024 Active Self, Pharmacy Records  aspirin  EC 81 MG tablet [id number]  Take 1 tablet (81 mg total) by mouth 2 (two) times daily. Swallow whole. Haydee Lipa, MD  Active   Cholecalciferol (VITAMIN D ) 125 MCG (5000 UT) CAPS [id number] No Take 5,000 Units by mouth daily. [provider] 01/30/2024 Active Self, Pharmacy Records  Cyanocobalamin  (B-12 PO) [id number] No Take 1 tablet by mouth daily. [provider] 01/30/2024 Active Self, Pharmacy Records  ezetimibe  (ZETIA ) 10 MG tablet [id number] No Take 1 tablet (10 mg total) by mouth daily. Roslyn Coombe, MD 01/30/2024 Active Self, Pharmacy Records  folic acid  (FOLVITE ) 1 MG tablet [id number] No Take 2 mg by mouth daily.  [provider] 01/30/2024 Active Self, Pharmacy Records  levothyroxine  (SYNTHROID ) 50 MCG tablet [id number] No Take 1 tablet (50 mcg total) by mouth daily. Roslyn Coombe, MD 01/30/2024 Active Self, Pharmacy Records  MAGNESIUM PO [id number] No Take 2 tablets by mouth daily. [provider] 01/30/2024 Active Self, Pharmacy Records  methotrexate  (RHEUMATREX) 2.5 MG tablet [id number] No Take 17.5 tablets by mouth once a week. [provider] 01/24/2024 Active Self, Pharmacy Records  metoprolol  tartrate (LOPRESSOR ) 50 MG tablet [id number] No TAKE 1 TABLET BY MOUTH TWICE A DAY Roslyn Coombe, MD 01/30/2024 Active Self, Pharmacy Records  Omega-3 Fatty Acids (FISH OIL PO) [id number] No Take 1 capsule by mouth daily. [provider] 01/30/2024 Active Self, Pharmacy Records  oxyCODONE  (OXY IR/ROXICODONE ) 5 MG immediate release tablet [id number]  Take 1 tablet (5 mg total) by mouth every 4 (four) hours as needed for moderate pain (pain score 4-6). Jasmine Mesi, MD  Active   pantoprazole  (PROTONIX ) 40 MG tablet [id number] No TAKE 1 TABLET BY MOUTH EVERY DAY Roslyn Coombe, MD 01/30/2024 Active Self, Pharmacy Records  polyethylene glycol (MIRALAX  / GLYCOLAX ) 17 g packet [id number]  Take 17 g by mouth 2 (two) times daily. Haydee Lipa, MD  Active   senna-docusate (SENOKOT-S) 8.6-50 MG tablet [id number]  Take 2 tablets by mouth 2 (two) times daily. Haydee Lipa, MD  Active   topiramate  (TOPAMAX ) 100 MG tablet [id number] No TAKE 1 TABLET BY MOUTH TWICE A DAY Roslyn Coombe, MD 01/30/2024 Active Self, Pharmacy Records            Home Care and  Equipment/Supplies: Were Home Health Services Ordered?: Yes Name of Home Health Agency:: unknown Has Agency set up a time to come to your home?: Yes First Home Health Visit Date: 02/22/24 Any new equipment or  medical supplies ordered?: NA  Functional Questionnaire: Do you need assistance with bathing/showering or dressing?: No Do you need assistance with meal preparation?: No Do you need assistance with eating?: No Do you have difficulty maintaining continence: No Do you need assistance with getting out of bed/getting out of a chair/moving?: No Do you have difficulty managing or taking your medications?: No  Follow up appointments reviewed: PCP Follow-up appointment confirmed?: Yes Date of PCP follow-up appointment?: 02/23/24 Follow-up Provider: [facility] Follow-up appointment confirmed?: NA Do you need transportation to your follow-up appointment?: No Do you understand care options if your condition(s) worsen?: Yes-patient verbalized understanding    SIGNATURE Darrall Ellison, LPN [facility] Nurse Health Advisor Direct Dial [phone]

## 2024-02-22 NOTE — Transitions of Care (Post Inpatient/ED Visit) (Signed)
   02/22/2024  Name: [name] MRN: [id number] DOB: [birth year]  Today's TOC FU Call Status: Today's TOC FU Call Status:: Unsuccessful Call (2nd Attempt) Unsuccessful Call (1st Attempt) Date: 02/21/24 Unsuccessful Call (2nd Attempt) Date: 02/22/24  Attempted to reach the patient regarding the most recent Inpatient/ED visit.  Follow Up Plan: Additional outreach attempts will be made to reach the patient to complete the Transitions of Care (Post Inpatient/ED visit) call.   Signature Darrall Ellison, LPN [facility] Nurse Health Advisor Direct Dial [phone]

## 2024-02-23 ENCOUNTER — Inpatient Hospital Stay: Admitting: Internal Medicine

## 2024-02-23 DIAGNOSIS — F0390 Unspecified dementia without behavioral disturbance: Secondary | ICD-10-CM | POA: Diagnosis not present

## 2024-02-23 DIAGNOSIS — W19XXXD Unspecified fall, subsequent encounter: Secondary | ICD-10-CM | POA: Diagnosis not present

## 2024-02-23 DIAGNOSIS — S72141D Displaced intertrochanteric fracture of right femur, subsequent encounter for closed fracture with routine healing: Secondary | ICD-10-CM | POA: Diagnosis not present

## 2024-02-23 DIAGNOSIS — Z9181 History of falling: Secondary | ICD-10-CM | POA: Diagnosis not present

## 2024-02-23 DIAGNOSIS — M069 Rheumatoid arthritis, unspecified: Secondary | ICD-10-CM | POA: Diagnosis not present

## 2024-02-23 DIAGNOSIS — E039 Hypothyroidism, unspecified: Secondary | ICD-10-CM | POA: Diagnosis not present

## 2024-02-23 DIAGNOSIS — R296 Repeated falls: Secondary | ICD-10-CM | POA: Diagnosis not present

## 2024-02-23 DIAGNOSIS — L89152 Pressure ulcer of sacral region, stage 2: Secondary | ICD-10-CM | POA: Diagnosis not present

## 2024-02-23 DIAGNOSIS — I10 Essential (primary) hypertension: Secondary | ICD-10-CM | POA: Diagnosis not present

## 2024-02-23 DIAGNOSIS — G250 Essential tremor: Secondary | ICD-10-CM | POA: Diagnosis not present

## 2024-02-23 NOTE — Progress Notes (Signed)
 Complex Care Management Note  Care Guide Note 02/23/2024 Name: [name] MRN: [id number] DOB: 06-Jun-1946  [name] is a 78 y.o. year old female who sees Roslyn Coombe, MD for primary care. I reached out to Patt Boozer by phone today to offer complex care management services.  Ms. Hosaka was given information about Complex Care Management services today including:   The Complex Care Management services include support from the care team which includes your Nurse Care Manager, Clinical Social Worker, or Pharmacist.  The Complex Care Management team is here to help remove barriers to the health concerns and goals most important to you. Complex Care Management services are voluntary, and the patient may decline or stop services at any time by request to their care team member.   Complex Care Management Consent Status: Patient agreed to services and verbal consent obtained.   Follow up plan:  Telephone appointment with complex care management team member scheduled for:  02/28/2024  Encounter Outcome:  Patient Scheduled  Kandis Ormond, CMA [place]  [facility], [facility] Guide Direct Dial: [phone]  Fax: [phone] Website: [email or web address]

## 2024-02-24 ENCOUNTER — Other Ambulatory Visit: Payer: Self-pay | Admitting: Internal Medicine

## 2024-02-24 ENCOUNTER — Other Ambulatory Visit: Payer: Self-pay | Admitting: Orthopedic Surgery

## 2024-02-26 ENCOUNTER — Telehealth: Payer: Self-pay | Admitting: Internal Medicine

## 2024-02-26 DIAGNOSIS — S72141D Displaced intertrochanteric fracture of right femur, subsequent encounter for closed fracture with routine healing: Secondary | ICD-10-CM | POA: Diagnosis not present

## 2024-02-26 DIAGNOSIS — F0390 Unspecified dementia without behavioral disturbance: Secondary | ICD-10-CM | POA: Diagnosis not present

## 2024-02-26 DIAGNOSIS — Z9181 History of falling: Secondary | ICD-10-CM | POA: Diagnosis not present

## 2024-02-26 DIAGNOSIS — L89152 Pressure ulcer of sacral region, stage 2: Secondary | ICD-10-CM | POA: Diagnosis not present

## 2024-02-26 DIAGNOSIS — W19XXXD Unspecified fall, subsequent encounter: Secondary | ICD-10-CM | POA: Diagnosis not present

## 2024-02-26 DIAGNOSIS — E039 Hypothyroidism, unspecified: Secondary | ICD-10-CM | POA: Diagnosis not present

## 2024-02-26 DIAGNOSIS — R296 Repeated falls: Secondary | ICD-10-CM | POA: Diagnosis not present

## 2024-02-26 DIAGNOSIS — G250 Essential tremor: Secondary | ICD-10-CM | POA: Diagnosis not present

## 2024-02-26 DIAGNOSIS — M069 Rheumatoid arthritis, unspecified: Secondary | ICD-10-CM | POA: Diagnosis not present

## 2024-02-26 DIAGNOSIS — I10 Essential (primary) hypertension: Secondary | ICD-10-CM | POA: Diagnosis not present

## 2024-02-26 NOTE — Telephone Encounter (Signed)
 Copied from CRM [id number]. Topic: Clinical - Request for Lab/Test Order >> Feb 26, 2024  1:09 PM Danae Duncans wrote: Reason for CRM: [place] Agent Medicare Case manager called to advise he receive claim for PT per policy a bone density screening would need to be order as she has a right femur fracture- Erla Haw will be faxing over the request and state he would like test to be completed within 6 month window - 07/28/24 Erla Haw can be reached - [phone]

## 2024-02-27 ENCOUNTER — Telehealth: Payer: Self-pay

## 2024-02-27 NOTE — Telephone Encounter (Signed)
 Copied from CRM [id number]. Topic: Clinical - Home Health Verbal Orders >> Feb 26, 2024  4:57 PM Stanly Early wrote: Caller/Agency: [facility] Callback Number: [phone] leave voicemail Service Requested: Occupational Therapy Frequency: 1 times a week times 8 Any new concerns about the patient? No

## 2024-02-27 NOTE — Telephone Encounter (Signed)
 Ok for [employer]

## 2024-02-28 ENCOUNTER — Other Ambulatory Visit: Payer: Self-pay

## 2024-02-28 NOTE — Telephone Encounter (Signed)
 Called and left voicemail giving verbals.
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
## 2024-02-28 NOTE — Patient Instructions (Signed)
 Visit Information  Thank you for taking time to visit with me today. Please don't hesitate to contact me if I can be of assistance to you before our next scheduled appointment.  Our next appointment is by telephone on 03/06/24 at 1:30 pm Please call the care guide team at [phone] if you need to cancel or reschedule your appointment.   Following is a copy of your care plan:   Goals Addressed             This Visit's Progress    VBCI RN Care Plan       Problems:  Post right hip replacement and discharged from SNF on 02/20/24  Goal: Over the next 90 days the Patient will attend all scheduled medical appointments: follow up surgery on 03/04/24 and PCP 03/08/24 as evidenced by patient report or review of chart        demonstrate Ongoing adherence to prescribed treatment plan for health conditions as evidenced by patient report or review of chart demonstrate ongoing self health care management ability   as evidenced by    patient report or review of chart  Interventions:   Post Right hip replacement Interventions: Discussed importance of attending provider appointments as scheduled Advised to continue to take medicatons as prescribed Reviewed signs/symptoms of infections Advised to contact provider surgeon with any questions or concerns related to the procedure   Falls Interventions: Provided written and verbal education re: potential causes of falls and Fall prevention strategies Confirmed patient is active with home health Advised to work with home health therapy as recommended  Patient Self-Care Activities:  Attend all scheduled provider appointments Call provider office for new concerns or questions  Take medications as prescribed   Work with home health therapy as recommended             Please call the Suicide and Crisis Lifeline: 988 call the [place]  National Suicide Prevention Lifeline: 608-173-6145 or TTY: 860 220 1838 TTY 929-368-9056) to talk to a trained  counselor if you are experiencing a Mental Health or Behavioral Health Crisis or need someone to talk to.  The patient verbalized understanding of instructions, educational materials, and care plan provided today and DECLINED offer to receive copy of patient instructions, educational materials, and care plan.   Lindi Revering, RN, MSN, BSN, CCM [place]  [facility], Population Health Case Manager Phone: [phone]

## 2024-02-29 DIAGNOSIS — G250 Essential tremor: Secondary | ICD-10-CM | POA: Diagnosis not present

## 2024-02-29 DIAGNOSIS — F0390 Unspecified dementia without behavioral disturbance: Secondary | ICD-10-CM | POA: Diagnosis not present

## 2024-02-29 DIAGNOSIS — W19XXXD Unspecified fall, subsequent encounter: Secondary | ICD-10-CM | POA: Diagnosis not present

## 2024-02-29 DIAGNOSIS — L89152 Pressure ulcer of sacral region, stage 2: Secondary | ICD-10-CM | POA: Diagnosis not present

## 2024-02-29 DIAGNOSIS — S72141D Displaced intertrochanteric fracture of right femur, subsequent encounter for closed fracture with routine healing: Secondary | ICD-10-CM | POA: Diagnosis not present

## 2024-02-29 DIAGNOSIS — Z9181 History of falling: Secondary | ICD-10-CM | POA: Diagnosis not present

## 2024-02-29 DIAGNOSIS — R296 Repeated falls: Secondary | ICD-10-CM | POA: Diagnosis not present

## 2024-02-29 DIAGNOSIS — E039 Hypothyroidism, unspecified: Secondary | ICD-10-CM | POA: Diagnosis not present

## 2024-02-29 DIAGNOSIS — M069 Rheumatoid arthritis, unspecified: Secondary | ICD-10-CM | POA: Diagnosis not present

## 2024-02-29 DIAGNOSIS — I10 Essential (primary) hypertension: Secondary | ICD-10-CM | POA: Diagnosis not present

## 2024-03-01 DIAGNOSIS — F0390 Unspecified dementia without behavioral disturbance: Secondary | ICD-10-CM | POA: Diagnosis not present

## 2024-03-01 DIAGNOSIS — M069 Rheumatoid arthritis, unspecified: Secondary | ICD-10-CM | POA: Diagnosis not present

## 2024-03-01 DIAGNOSIS — G250 Essential tremor: Secondary | ICD-10-CM | POA: Diagnosis not present

## 2024-03-01 DIAGNOSIS — S72141D Displaced intertrochanteric fracture of right femur, subsequent encounter for closed fracture with routine healing: Secondary | ICD-10-CM | POA: Diagnosis not present

## 2024-03-01 DIAGNOSIS — L89152 Pressure ulcer of sacral region, stage 2: Secondary | ICD-10-CM | POA: Diagnosis not present

## 2024-03-01 DIAGNOSIS — W19XXXD Unspecified fall, subsequent encounter: Secondary | ICD-10-CM | POA: Diagnosis not present

## 2024-03-01 DIAGNOSIS — R296 Repeated falls: Secondary | ICD-10-CM | POA: Diagnosis not present

## 2024-03-01 DIAGNOSIS — E039 Hypothyroidism, unspecified: Secondary | ICD-10-CM | POA: Diagnosis not present

## 2024-03-01 DIAGNOSIS — I10 Essential (primary) hypertension: Secondary | ICD-10-CM | POA: Diagnosis not present

## 2024-03-01 DIAGNOSIS — Z9181 History of falling: Secondary | ICD-10-CM | POA: Diagnosis not present

## 2024-03-02 NOTE — Telephone Encounter (Signed)
 Good thing then that test was ordered in mar 2025 and is up to the patient to complete, so I will not need to be signing off on an insurance request

## 2024-03-04 ENCOUNTER — Encounter: Payer: Self-pay | Admitting: Surgical

## 2024-03-04 ENCOUNTER — Telehealth: Payer: Self-pay

## 2024-03-04 ENCOUNTER — Other Ambulatory Visit (INDEPENDENT_AMBULATORY_CARE_PROVIDER_SITE_OTHER): Payer: Self-pay

## 2024-03-04 ENCOUNTER — Ambulatory Visit: Admitting: Surgical

## 2024-03-04 DIAGNOSIS — M069 Rheumatoid arthritis, unspecified: Secondary | ICD-10-CM | POA: Diagnosis not present

## 2024-03-04 DIAGNOSIS — I10 Essential (primary) hypertension: Secondary | ICD-10-CM | POA: Diagnosis not present

## 2024-03-04 DIAGNOSIS — S72001A Fracture of unspecified part of neck of right femur, initial encounter for closed fracture: Secondary | ICD-10-CM | POA: Diagnosis not present

## 2024-03-04 DIAGNOSIS — S72141D Displaced intertrochanteric fracture of right femur, subsequent encounter for closed fracture with routine healing: Secondary | ICD-10-CM | POA: Diagnosis not present

## 2024-03-04 DIAGNOSIS — G250 Essential tremor: Secondary | ICD-10-CM | POA: Diagnosis not present

## 2024-03-04 DIAGNOSIS — Z9181 History of falling: Secondary | ICD-10-CM | POA: Diagnosis not present

## 2024-03-04 DIAGNOSIS — F0390 Unspecified dementia without behavioral disturbance: Secondary | ICD-10-CM | POA: Diagnosis not present

## 2024-03-04 DIAGNOSIS — W19XXXD Unspecified fall, subsequent encounter: Secondary | ICD-10-CM | POA: Diagnosis not present

## 2024-03-04 DIAGNOSIS — L89152 Pressure ulcer of sacral region, stage 2: Secondary | ICD-10-CM | POA: Diagnosis not present

## 2024-03-04 DIAGNOSIS — R296 Repeated falls: Secondary | ICD-10-CM | POA: Diagnosis not present

## 2024-03-04 DIAGNOSIS — E039 Hypothyroidism, unspecified: Secondary | ICD-10-CM | POA: Diagnosis not present

## 2024-03-04 NOTE — Telephone Encounter (Signed)
 Copied from CRM [id number]. Topic: Clinical - Home Health Verbal Orders >> Mar 01, 2024  9:21 AM Lisa Rideau A wrote: Caller/Agency: [facility] Callback Number: [phone] Service Requested: Physical Therapy Frequency: 1x week for eights effective this week Any new concerns about the patient? No

## 2024-03-04 NOTE — Telephone Encounter (Signed)
 Ok for [employer]

## 2024-03-04 NOTE — Telephone Encounter (Signed)
 Called and gave verbals.

## 2024-03-05 DIAGNOSIS — M069 Rheumatoid arthritis, unspecified: Secondary | ICD-10-CM | POA: Diagnosis not present

## 2024-03-05 DIAGNOSIS — F0390 Unspecified dementia without behavioral disturbance: Secondary | ICD-10-CM | POA: Diagnosis not present

## 2024-03-05 DIAGNOSIS — L89152 Pressure ulcer of sacral region, stage 2: Secondary | ICD-10-CM | POA: Diagnosis not present

## 2024-03-05 DIAGNOSIS — Z9181 History of falling: Secondary | ICD-10-CM | POA: Diagnosis not present

## 2024-03-05 DIAGNOSIS — E039 Hypothyroidism, unspecified: Secondary | ICD-10-CM | POA: Diagnosis not present

## 2024-03-05 DIAGNOSIS — R296 Repeated falls: Secondary | ICD-10-CM | POA: Diagnosis not present

## 2024-03-05 DIAGNOSIS — G250 Essential tremor: Secondary | ICD-10-CM | POA: Diagnosis not present

## 2024-03-05 DIAGNOSIS — W19XXXD Unspecified fall, subsequent encounter: Secondary | ICD-10-CM | POA: Diagnosis not present

## 2024-03-05 DIAGNOSIS — S72141D Displaced intertrochanteric fracture of right femur, subsequent encounter for closed fracture with routine healing: Secondary | ICD-10-CM | POA: Diagnosis not present

## 2024-03-05 DIAGNOSIS — I10 Essential (primary) hypertension: Secondary | ICD-10-CM | POA: Diagnosis not present

## 2024-03-06 ENCOUNTER — Encounter: Payer: Self-pay | Admitting: Surgical

## 2024-03-06 ENCOUNTER — Other Ambulatory Visit: Payer: Self-pay

## 2024-03-06 DIAGNOSIS — G250 Essential tremor: Secondary | ICD-10-CM | POA: Diagnosis not present

## 2024-03-06 DIAGNOSIS — R296 Repeated falls: Secondary | ICD-10-CM | POA: Diagnosis not present

## 2024-03-06 DIAGNOSIS — F0390 Unspecified dementia without behavioral disturbance: Secondary | ICD-10-CM | POA: Diagnosis not present

## 2024-03-06 DIAGNOSIS — E039 Hypothyroidism, unspecified: Secondary | ICD-10-CM | POA: Diagnosis not present

## 2024-03-06 DIAGNOSIS — L89152 Pressure ulcer of sacral region, stage 2: Secondary | ICD-10-CM | POA: Diagnosis not present

## 2024-03-06 DIAGNOSIS — Z9181 History of falling: Secondary | ICD-10-CM | POA: Diagnosis not present

## 2024-03-06 DIAGNOSIS — W19XXXD Unspecified fall, subsequent encounter: Secondary | ICD-10-CM | POA: Diagnosis not present

## 2024-03-06 DIAGNOSIS — S72141D Displaced intertrochanteric fracture of right femur, subsequent encounter for closed fracture with routine healing: Secondary | ICD-10-CM | POA: Diagnosis not present

## 2024-03-06 DIAGNOSIS — M069 Rheumatoid arthritis, unspecified: Secondary | ICD-10-CM | POA: Diagnosis not present

## 2024-03-06 DIAGNOSIS — I10 Essential (primary) hypertension: Secondary | ICD-10-CM | POA: Diagnosis not present
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
## 2024-03-06 NOTE — Patient Instructions (Addendum)
 Visit Information  Thank you for taking time to visit with me today. Please don't hesitate to contact me if I can be of assistance to you before our next scheduled appointment.  Your next care management appointment is by telephone on 04/08/24 at 3:00 pm  Please call the care guide team at [phone] if you need to cancel, schedule, or reschedule an appointment.   Please call the Suicide and Crisis Lifeline: 988 call the [place]  National Suicide Prevention Lifeline: (234)261-4721 or TTY: 317-733-1804 TTY (253) 629-1855) to talk to a trained counselor if you are experiencing a Mental Health or Behavioral Health Crisis or need someone to talk to.  Lindi Revering, RN, MSN, BSN, CCM [place]  [facility], Population Health Case Manager Phone: [phone]
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
## 2024-03-07 ENCOUNTER — Telehealth: Payer: Self-pay

## 2024-03-07 ENCOUNTER — Inpatient Hospital Stay: Admitting: Internal Medicine
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
## 2024-03-08 ENCOUNTER — Ambulatory Visit: Payer: Self-pay | Admitting: Internal Medicine

## 2024-03-08 ENCOUNTER — Ambulatory Visit (INDEPENDENT_AMBULATORY_CARE_PROVIDER_SITE_OTHER): Admitting: Internal Medicine

## 2024-03-08 ENCOUNTER — Encounter: Payer: Self-pay | Admitting: Internal Medicine

## 2024-03-08 VITALS — BP 130/76 | HR 64 | Temp 98.2°F | Ht 60.0 in | Wt 117.0 lb

## 2024-03-08 DIAGNOSIS — D62 Acute posthemorrhagic anemia: Secondary | ICD-10-CM | POA: Diagnosis not present

## 2024-03-08 DIAGNOSIS — I10 Essential (primary) hypertension: Secondary | ICD-10-CM

## 2024-03-08 DIAGNOSIS — L609 Nail disorder, unspecified: Secondary | ICD-10-CM

## 2024-03-08 DIAGNOSIS — Z13820 Encounter for screening for osteoporosis: Secondary | ICD-10-CM | POA: Diagnosis not present

## 2024-03-08 DIAGNOSIS — S72001A Fracture of unspecified part of neck of right femur, initial encounter for closed fracture: Secondary | ICD-10-CM

## 2024-03-08 DIAGNOSIS — E611 Iron deficiency: Secondary | ICD-10-CM | POA: Diagnosis not present

## 2024-03-08 DIAGNOSIS — L89159 Pressure ulcer of sacral region, unspecified stage: Secondary | ICD-10-CM

## 2024-03-08 LAB — HEPATIC FUNCTION PANEL
ALT: 12 U/L (ref 0–35)
AST: 19 U/L (ref 0–37)
Albumin: 4.2 g/dL (ref 3.5–5.2)
Alkaline Phosphatase: 100 U/L (ref 39–117)
Bilirubin, Direct: 0 mg/dL (ref 0.0–0.3)
Total Bilirubin: 0.4 mg/dL (ref 0.2–1.2)
Total Protein: 7.1 g/dL (ref 6.0–8.3)

## 2024-03-08 LAB — FERRITIN: Ferritin: 189.2 ng/mL (ref 10.0–291.0)

## 2024-03-08 LAB — BASIC METABOLIC PANEL WITH GFR
BUN: 23 mg/dL (ref 6–23)
CO2: 26 meq/L (ref 19–32)
Calcium: 9.6 mg/dL (ref 8.4–10.5)
Chloride: 107 meq/L (ref 96–112)
Creatinine, Ser: 0.75 mg/dL (ref 0.40–1.20)
GFR: 76.04 mL/min (ref >60.00)
Glucose, Bld: 121 mg/dL — ABNORMAL HIGH (ref 70–99)
Potassium: 4.4 meq/L (ref 3.5–5.1)
Sodium: 140 meq/L (ref 135–145)

## 2024-03-08 LAB — CBC WITH DIFFERENTIAL/PLATELET
Basophils Absolute: 0.1 10*3/uL (ref 0.0–0.1)
Basophils Relative: 1.2 % (ref 0.0–3.0)
Eosinophils Absolute: 0.3 10*3/uL (ref 0.0–0.7)
Eosinophils Relative: 4.2 % (ref 0.0–5.0)
HCT: 32.7 % — ABNORMAL LOW (ref 36.0–46.0)
Hemoglobin: 11.1 g/dL — ABNORMAL LOW (ref 12.0–15.0)
Lymphocytes Relative: 25.2 % (ref 12.0–46.0)
Lymphs Abs: 1.6 10*3/uL (ref 0.7–4.0)
MCHC: 34 g/dL (ref 30.0–36.0)
MCV: 97.5 fl (ref 78.0–100.0)
Monocytes Absolute: 0.4 10*3/uL (ref 0.1–1.0)
Monocytes Relative: 6.7 % (ref 3.0–12.0)
Neutro Abs: 4.1 10*3/uL (ref 1.4–7.7)
Neutrophils Relative %: 62.7 % (ref 43.0–77.0)
Platelets: 278 10*3/uL (ref 150.0–400.0)
RBC: 3.35 Mil/uL — ABNORMAL LOW (ref 3.87–5.11)
RDW: 15 % (ref 11.5–15.5)
WBC: 6.5 10*3/uL (ref 4.0–10.5)

## 2024-03-08 LAB — IBC PANEL
Iron: 56 ug/dL (ref 42–145)
Saturation Ratios: 17 % — ABNORMAL LOW (ref 20.0–50.0)
TIBC: 329 ug/dL (ref 250.0–450.0)
Transferrin: 235 mg/dL (ref 212.0–360.0)
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
## 2024-03-08 NOTE — Patient Instructions (Addendum)
 You are given the handicapped parking appllication  Please continue all other medications as before, and refills have been done if requested.  Please have the pharmacy call with any other refills you may need.  Please continue your efforts at being more active, low cholesterol diet, and weight control.  You are otherwise up to date with prevention measures today.  Please keep your appointments with your specialists as you may have planned - PT and wound care at home  You will be contacted regarding the referral for: podiatry for the nails  Please go to the LAB at the blood drawing area for the tests to be done  You will be contacted by phone if any changes need to be made immediately.  Otherwise, you will receive a letter about your results with an explanation, but please check with MyChart first.  Please make an Appointment to return in Aug 7, or sooner if needed

## 2024-03-10 ENCOUNTER — Encounter: Payer: Self-pay | Admitting: Internal Medicine

## 2024-03-10 DIAGNOSIS — L89159 Pressure ulcer of sacral region, unspecified stage: Secondary | ICD-10-CM | POA: Insufficient documentation

## 2024-03-10 DIAGNOSIS — L609 Nail disorder, unspecified: Secondary | ICD-10-CM | POA: Insufficient documentation

## 2024-03-10 NOTE — Assessment & Plan Note (Signed)
 Also for [facility] with wound care to start soon

## 2024-03-10 NOTE — Assessment & Plan Note (Signed)
 BP Readings from Last 3 Encounters:  03/08/24 130/76  03/06/24 120/70  02/06/24 (!) 165/82   Stable, pt to continue medical treatment lopressor  50 bid

## 2024-03-10 NOTE — Assessment & Plan Note (Signed)
Pt needs podiatry referral

## 2024-03-10 NOTE — Assessment & Plan Note (Signed)
 Doing well, cont HH with PT

## 2024-03-10 NOTE — Assessment & Plan Note (Signed)
 Also for f/u cbc, no recent overt bleeding.

## 2024-03-10 NOTE — Assessment & Plan Note (Signed)
 Lab Results  Component Value Date   IRON  56 03/08/2024   TIBC 329.0 03/08/2024   FERRITIN 189.2 03/08/2024   Adequate, cont same tx

## 2024-03-12 DIAGNOSIS — F0390 Unspecified dementia without behavioral disturbance: Secondary | ICD-10-CM | POA: Diagnosis not present

## 2024-03-12 DIAGNOSIS — S72141D Displaced intertrochanteric fracture of right femur, subsequent encounter for closed fracture with routine healing: Secondary | ICD-10-CM | POA: Diagnosis not present

## 2024-03-12 DIAGNOSIS — G250 Essential tremor: Secondary | ICD-10-CM | POA: Diagnosis not present

## 2024-03-12 DIAGNOSIS — E039 Hypothyroidism, unspecified: Secondary | ICD-10-CM | POA: Diagnosis not present

## 2024-03-12 DIAGNOSIS — R296 Repeated falls: Secondary | ICD-10-CM | POA: Diagnosis not present

## 2024-03-12 DIAGNOSIS — M069 Rheumatoid arthritis, unspecified: Secondary | ICD-10-CM | POA: Diagnosis not present

## 2024-03-12 DIAGNOSIS — I10 Essential (primary) hypertension: Secondary | ICD-10-CM | POA: Diagnosis not present

## 2024-03-12 DIAGNOSIS — L89152 Pressure ulcer of sacral region, stage 2: Secondary | ICD-10-CM | POA: Diagnosis not present

## 2024-03-12 DIAGNOSIS — Z9181 History of falling: Secondary | ICD-10-CM | POA: Diagnosis not present

## 2024-03-12 DIAGNOSIS — W19XXXD Unspecified fall, subsequent encounter: Secondary | ICD-10-CM | POA: Diagnosis not present

## 2024-03-13 DIAGNOSIS — F0390 Unspecified dementia without behavioral disturbance: Secondary | ICD-10-CM | POA: Diagnosis not present

## 2024-03-13 DIAGNOSIS — R296 Repeated falls: Secondary | ICD-10-CM | POA: Diagnosis not present

## 2024-03-13 DIAGNOSIS — M069 Rheumatoid arthritis, unspecified: Secondary | ICD-10-CM | POA: Diagnosis not present

## 2024-03-13 DIAGNOSIS — S72141D Displaced intertrochanteric fracture of right femur, subsequent encounter for closed fracture with routine healing: Secondary | ICD-10-CM | POA: Diagnosis not present

## 2024-03-13 DIAGNOSIS — L89152 Pressure ulcer of sacral region, stage 2: Secondary | ICD-10-CM | POA: Diagnosis not present

## 2024-03-13 DIAGNOSIS — W19XXXD Unspecified fall, subsequent encounter: Secondary | ICD-10-CM | POA: Diagnosis not present

## 2024-03-13 DIAGNOSIS — E039 Hypothyroidism, unspecified: Secondary | ICD-10-CM | POA: Diagnosis not present

## 2024-03-13 DIAGNOSIS — G250 Essential tremor: Secondary | ICD-10-CM | POA: Diagnosis not present

## 2024-03-13 DIAGNOSIS — I10 Essential (primary) hypertension: Secondary | ICD-10-CM | POA: Diagnosis not present

## 2024-03-13 DIAGNOSIS — Z9181 History of falling: Secondary | ICD-10-CM | POA: Diagnosis not present

## 2024-03-18 DIAGNOSIS — Z9181 History of falling: Secondary | ICD-10-CM | POA: Diagnosis not present

## 2024-03-18 DIAGNOSIS — F0390 Unspecified dementia without behavioral disturbance: Secondary | ICD-10-CM | POA: Diagnosis not present

## 2024-03-18 DIAGNOSIS — L89152 Pressure ulcer of sacral region, stage 2: Secondary | ICD-10-CM | POA: Diagnosis not present

## 2024-03-18 DIAGNOSIS — W19XXXD Unspecified fall, subsequent encounter: Secondary | ICD-10-CM | POA: Diagnosis not present

## 2024-03-18 DIAGNOSIS — G250 Essential tremor: Secondary | ICD-10-CM | POA: Diagnosis not present

## 2024-03-18 DIAGNOSIS — E039 Hypothyroidism, unspecified: Secondary | ICD-10-CM | POA: Diagnosis not present

## 2024-03-18 DIAGNOSIS — S72141D Displaced intertrochanteric fracture of right femur, subsequent encounter for closed fracture with routine healing: Secondary | ICD-10-CM | POA: Diagnosis not present

## 2024-03-18 DIAGNOSIS — I10 Essential (primary) hypertension: Secondary | ICD-10-CM | POA: Diagnosis not present

## 2024-03-18 DIAGNOSIS — R296 Repeated falls: Secondary | ICD-10-CM | POA: Diagnosis not present

## 2024-03-18 DIAGNOSIS — M069 Rheumatoid arthritis, unspecified: Secondary | ICD-10-CM | POA: Diagnosis not present

## 2024-03-19 DIAGNOSIS — W19XXXD Unspecified fall, subsequent encounter: Secondary | ICD-10-CM | POA: Diagnosis not present

## 2024-03-19 DIAGNOSIS — R296 Repeated falls: Secondary | ICD-10-CM | POA: Diagnosis not present

## 2024-03-19 DIAGNOSIS — Z9181 History of falling: Secondary | ICD-10-CM | POA: Diagnosis not present

## 2024-03-19 DIAGNOSIS — M069 Rheumatoid arthritis, unspecified: Secondary | ICD-10-CM | POA: Diagnosis not present

## 2024-03-19 DIAGNOSIS — F0390 Unspecified dementia without behavioral disturbance: Secondary | ICD-10-CM | POA: Diagnosis not present

## 2024-03-19 DIAGNOSIS — E039 Hypothyroidism, unspecified: Secondary | ICD-10-CM | POA: Diagnosis not present

## 2024-03-19 DIAGNOSIS — G250 Essential tremor: Secondary | ICD-10-CM | POA: Diagnosis not present

## 2024-03-19 DIAGNOSIS — S72141D Displaced intertrochanteric fracture of right femur, subsequent encounter for closed fracture with routine healing: Secondary | ICD-10-CM | POA: Diagnosis not present

## 2024-03-19 DIAGNOSIS — L89152 Pressure ulcer of sacral region, stage 2: Secondary | ICD-10-CM | POA: Diagnosis not present

## 2024-03-19 DIAGNOSIS — I10 Essential (primary) hypertension: Secondary | ICD-10-CM | POA: Diagnosis not present

## 2024-03-21 ENCOUNTER — Telehealth: Payer: Self-pay | Admitting: Internal Medicine

## 2024-03-21 DIAGNOSIS — R296 Repeated falls: Secondary | ICD-10-CM | POA: Diagnosis not present

## 2024-03-21 DIAGNOSIS — G250 Essential tremor: Secondary | ICD-10-CM | POA: Diagnosis not present

## 2024-03-21 DIAGNOSIS — I10 Essential (primary) hypertension: Secondary | ICD-10-CM | POA: Diagnosis not present

## 2024-03-21 DIAGNOSIS — W19XXXD Unspecified fall, subsequent encounter: Secondary | ICD-10-CM | POA: Diagnosis not present

## 2024-03-21 DIAGNOSIS — Z9181 History of falling: Secondary | ICD-10-CM | POA: Diagnosis not present

## 2024-03-21 DIAGNOSIS — M069 Rheumatoid arthritis, unspecified: Secondary | ICD-10-CM | POA: Diagnosis not present

## 2024-03-21 DIAGNOSIS — E039 Hypothyroidism, unspecified: Secondary | ICD-10-CM | POA: Diagnosis not present

## 2024-03-21 DIAGNOSIS — S72141D Displaced intertrochanteric fracture of right femur, subsequent encounter for closed fracture with routine healing: Secondary | ICD-10-CM | POA: Diagnosis not present

## 2024-03-21 DIAGNOSIS — F0390 Unspecified dementia without behavioral disturbance: Secondary | ICD-10-CM | POA: Diagnosis not present

## 2024-03-21 DIAGNOSIS — L89152 Pressure ulcer of sacral region, stage 2: Secondary | ICD-10-CM | POA: Diagnosis not present

## 2024-03-21 NOTE — Telephone Encounter (Signed)
 Copied from CRM [id number]. Topic: Clinical - Lab/Test Results >> Mar 21, 2024 10:58 AM [place] J wrote: Reason for CRM: Patient was calling in to check on the bone density test she completed. She said that someone came to her house for the test and never had an update after that. I let her know that we don't have any thing for her bone density test and that I'd let Dr. Autry Legions know what was going on.

## 2024-03-21 NOTE — Telephone Encounter (Signed)
 Yes, we normally dont order testing for this to be done at home, perhaps this was done by an insurance nurse who came to her house.  She would need to call them for results

## 2024-03-22 NOTE — Telephone Encounter (Signed)
Called and let Pt know and she states understanding.

## 2024-03-25 DIAGNOSIS — Z9181 History of falling: Secondary | ICD-10-CM | POA: Diagnosis not present

## 2024-03-25 DIAGNOSIS — G250 Essential tremor: Secondary | ICD-10-CM | POA: Diagnosis not present

## 2024-03-25 DIAGNOSIS — R296 Repeated falls: Secondary | ICD-10-CM | POA: Diagnosis not present

## 2024-03-25 DIAGNOSIS — F0390 Unspecified dementia without behavioral disturbance: Secondary | ICD-10-CM | POA: Diagnosis not present

## 2024-03-25 DIAGNOSIS — S72141D Displaced intertrochanteric fracture of right femur, subsequent encounter for closed fracture with routine healing: Secondary | ICD-10-CM | POA: Diagnosis not present

## 2024-03-25 DIAGNOSIS — L89152 Pressure ulcer of sacral region, stage 2: Secondary | ICD-10-CM | POA: Diagnosis not present

## 2024-03-25 DIAGNOSIS — M069 Rheumatoid arthritis, unspecified: Secondary | ICD-10-CM | POA: Diagnosis not present

## 2024-03-25 DIAGNOSIS — I10 Essential (primary) hypertension: Secondary | ICD-10-CM | POA: Diagnosis not present

## 2024-03-25 DIAGNOSIS — E039 Hypothyroidism, unspecified: Secondary | ICD-10-CM | POA: Diagnosis not present

## 2024-03-25 DIAGNOSIS — W19XXXD Unspecified fall, subsequent encounter: Secondary | ICD-10-CM | POA: Diagnosis not present

## 2024-03-26 DIAGNOSIS — M069 Rheumatoid arthritis, unspecified: Secondary | ICD-10-CM | POA: Diagnosis not present

## 2024-03-26 DIAGNOSIS — S72141D Displaced intertrochanteric fracture of right femur, subsequent encounter for closed fracture with routine healing: Secondary | ICD-10-CM | POA: Diagnosis not present

## 2024-03-26 DIAGNOSIS — W19XXXD Unspecified fall, subsequent encounter: Secondary | ICD-10-CM | POA: Diagnosis not present

## 2024-03-26 DIAGNOSIS — I10 Essential (primary) hypertension: Secondary | ICD-10-CM | POA: Diagnosis not present

## 2024-03-26 DIAGNOSIS — F0390 Unspecified dementia without behavioral disturbance: Secondary | ICD-10-CM | POA: Diagnosis not present

## 2024-03-26 DIAGNOSIS — E039 Hypothyroidism, unspecified: Secondary | ICD-10-CM | POA: Diagnosis not present

## 2024-03-26 DIAGNOSIS — Z9181 History of falling: Secondary | ICD-10-CM | POA: Diagnosis not present

## 2024-03-26 DIAGNOSIS — L89152 Pressure ulcer of sacral region, stage 2: Secondary | ICD-10-CM | POA: Diagnosis not present

## 2024-03-26 DIAGNOSIS — G250 Essential tremor: Secondary | ICD-10-CM | POA: Diagnosis not present

## 2024-03-26 DIAGNOSIS — R296 Repeated falls: Secondary | ICD-10-CM | POA: Diagnosis not present

## 2024-03-30 ENCOUNTER — Other Ambulatory Visit: Payer: Self-pay | Admitting: Internal Medicine

## 2024-04-01 DIAGNOSIS — W19XXXD Unspecified fall, subsequent encounter: Secondary | ICD-10-CM | POA: Diagnosis not present

## 2024-04-01 DIAGNOSIS — Z9181 History of falling: Secondary | ICD-10-CM | POA: Diagnosis not present

## 2024-04-01 DIAGNOSIS — M069 Rheumatoid arthritis, unspecified: Secondary | ICD-10-CM | POA: Diagnosis not present

## 2024-04-01 DIAGNOSIS — L89152 Pressure ulcer of sacral region, stage 2: Secondary | ICD-10-CM | POA: Diagnosis not present

## 2024-04-01 DIAGNOSIS — I10 Essential (primary) hypertension: Secondary | ICD-10-CM | POA: Diagnosis not present

## 2024-04-01 DIAGNOSIS — E039 Hypothyroidism, unspecified: Secondary | ICD-10-CM | POA: Diagnosis not present

## 2024-04-01 DIAGNOSIS — R296 Repeated falls: Secondary | ICD-10-CM | POA: Diagnosis not present

## 2024-04-01 DIAGNOSIS — F0390 Unspecified dementia without behavioral disturbance: Secondary | ICD-10-CM | POA: Diagnosis not present

## 2024-04-01 DIAGNOSIS — G250 Essential tremor: Secondary | ICD-10-CM | POA: Diagnosis not present

## 2024-04-01 DIAGNOSIS — S72141D Displaced intertrochanteric fracture of right femur, subsequent encounter for closed fracture with routine healing: Secondary | ICD-10-CM | POA: Diagnosis not present

## 2024-04-02 ENCOUNTER — Ambulatory Visit: Admitting: Podiatry

## 2024-04-02 DIAGNOSIS — R296 Repeated falls: Secondary | ICD-10-CM | POA: Diagnosis not present

## 2024-04-02 DIAGNOSIS — W19XXXD Unspecified fall, subsequent encounter: Secondary | ICD-10-CM | POA: Diagnosis not present

## 2024-04-02 DIAGNOSIS — Z9181 History of falling: Secondary | ICD-10-CM | POA: Diagnosis not present

## 2024-04-02 DIAGNOSIS — F0390 Unspecified dementia without behavioral disturbance: Secondary | ICD-10-CM | POA: Diagnosis not present

## 2024-04-02 DIAGNOSIS — M069 Rheumatoid arthritis, unspecified: Secondary | ICD-10-CM | POA: Diagnosis not present

## 2024-04-02 DIAGNOSIS — E039 Hypothyroidism, unspecified: Secondary | ICD-10-CM | POA: Diagnosis not present

## 2024-04-02 DIAGNOSIS — S72141D Displaced intertrochanteric fracture of right femur, subsequent encounter for closed fracture with routine healing: Secondary | ICD-10-CM | POA: Diagnosis not present

## 2024-04-02 DIAGNOSIS — I10 Essential (primary) hypertension: Secondary | ICD-10-CM | POA: Diagnosis not present

## 2024-04-02 DIAGNOSIS — L89152 Pressure ulcer of sacral region, stage 2: Secondary | ICD-10-CM | POA: Diagnosis not present

## 2024-04-02 DIAGNOSIS — G250 Essential tremor: Secondary | ICD-10-CM | POA: Diagnosis not present

## 2024-04-04 ENCOUNTER — Encounter (HOSPITAL_BASED_OUTPATIENT_CLINIC_OR_DEPARTMENT_OTHER): Admitting: Internal Medicine

## 2024-04-05 DIAGNOSIS — G250 Essential tremor: Secondary | ICD-10-CM | POA: Diagnosis not present

## 2024-04-05 DIAGNOSIS — M069 Rheumatoid arthritis, unspecified: Secondary | ICD-10-CM | POA: Diagnosis not present

## 2024-04-05 DIAGNOSIS — E039 Hypothyroidism, unspecified: Secondary | ICD-10-CM | POA: Diagnosis not present

## 2024-04-05 DIAGNOSIS — W19XXXD Unspecified fall, subsequent encounter: Secondary | ICD-10-CM | POA: Diagnosis not present

## 2024-04-05 DIAGNOSIS — L89152 Pressure ulcer of sacral region, stage 2: Secondary | ICD-10-CM | POA: Diagnosis not present

## 2024-04-05 DIAGNOSIS — F0390 Unspecified dementia without behavioral disturbance: Secondary | ICD-10-CM | POA: Diagnosis not present

## 2024-04-05 DIAGNOSIS — I10 Essential (primary) hypertension: Secondary | ICD-10-CM | POA: Diagnosis not present

## 2024-04-05 DIAGNOSIS — R296 Repeated falls: Secondary | ICD-10-CM | POA: Diagnosis not present

## 2024-04-05 DIAGNOSIS — Z9181 History of falling: Secondary | ICD-10-CM | POA: Diagnosis not present

## 2024-04-05 DIAGNOSIS — S72141D Displaced intertrochanteric fracture of right femur, subsequent encounter for closed fracture with routine healing: Secondary | ICD-10-CM | POA: Diagnosis not present

## 2024-04-08 ENCOUNTER — Other Ambulatory Visit: Payer: Self-pay

## 2024-04-08 NOTE — Patient Instructions (Signed)
 Visit Information  Thank you for taking time to visit with me today. Please don't hesitate to contact me if I can be of assistance to you before our next scheduled appointment.  Our next appointment is by telephone on 05/10/24 at 10:00 am Please call the care guide team at [phone] if you need to cancel or reschedule your appointment.   Following is a copy of your care plan:   Goals Addressed             This Visit's Progress    VBCI RN Care Plan       Problems:  Chronic Disease Management support and education needs related to sacral wound 04/08/24-patient reports sacral wound healed. She reports the area continues to be monitored by [facility] Post right hip replacement and discharged from SNF on 02/20/24-patient has been released from orthopedic provider. Patient states incision is healed Sacral wound(patient reports wound noted while in the hospital)-per patient wound healed Fall Risk-continues to work with [employer] therapist  Goal: Over the next 90 days the Patient will attend all scheduled medical appointments: PCP 03/08/24 as evidenced by patient report or review of chart        demonstrate Ongoing adherence to prescribed treatment plan for health conditions as evidenced by patient report or review of chart demonstrate ongoing self health care management ability   as evidenced by    patient report or review of chart Patient will verbalize improvement in wound healing as evidence by patient report and/or review of chart Patient will verbalize understanding of wound care management as evidence by patient report or review of chart Patient will verbalize understanding of company completing bone density and obtain bone density scan as ordered and recommended by insurance  Interventions:   Post Right hip replacement Interventions: completed Advised to contact provider surgeon with any questions or concerns as needed  Falls Interventions: Assessed for falls since last  encounter Reiterated importance of working with therapist and performing exercises as recommended  Reimphasized fall prevention strategies  Advised patient to use assistive device as directed Encouraged to use assistive device as recommended  Sacral Wound Interventions: completed Discussed the importance of good nutrition and protein in healing Discussed minimizing pressure to the area, change position often, keep area clean Discussed signs/symptoms of infection Assess for pain management needs Discussed activity level and Encouraged to remain as active as recommended/tolerated Patient has upcoming appointment with PCP on 03/08/24 and wound care clinic appointment on 04/04/24-patient reports did not go to wound clinic appointment because would was healed.   Patient Self-Care Activities:  Attend all scheduled provider appointments Call provider office for new concerns or questions  Take medications as prescribed   Work with home health therapy as recommended for ambulation muscle strength and tone and wound care Remain active use assistive device (walker) as recommended with ambulation Attend appointment with PCP 05/09/24      Please call the Suicide and Crisis Lifeline: 988 call the [place]  National Suicide Prevention Lifeline: 628-487-4557 or TTY: 7254498467 TTY [phone]) to talk to a trained counselor if you are experiencing a Mental Health or Behavioral Health Crisis or need someone to talk to.  The patient verbalized understanding of instructions, educational materials, and care plan provided today and DECLINED offer to receive copy of patient instructions, educational materials, and care plan.   Heddy Shutter, RN, MSN, BSN, CCM [place]  [facility], Population Health Case Manager Phone: [phone]
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
## 2024-04-09 DIAGNOSIS — Z9181 History of falling: Secondary | ICD-10-CM | POA: Diagnosis not present

## 2024-04-09 DIAGNOSIS — W19XXXD Unspecified fall, subsequent encounter: Secondary | ICD-10-CM | POA: Diagnosis not present

## 2024-04-09 DIAGNOSIS — M069 Rheumatoid arthritis, unspecified: Secondary | ICD-10-CM | POA: Diagnosis not present

## 2024-04-09 DIAGNOSIS — L89152 Pressure ulcer of sacral region, stage 2: Secondary | ICD-10-CM | POA: Diagnosis not present

## 2024-04-09 DIAGNOSIS — E039 Hypothyroidism, unspecified: Secondary | ICD-10-CM | POA: Diagnosis not present

## 2024-04-09 DIAGNOSIS — G250 Essential tremor: Secondary | ICD-10-CM | POA: Diagnosis not present

## 2024-04-09 DIAGNOSIS — S72141D Displaced intertrochanteric fracture of right femur, subsequent encounter for closed fracture with routine healing: Secondary | ICD-10-CM | POA: Diagnosis not present

## 2024-04-09 DIAGNOSIS — I10 Essential (primary) hypertension: Secondary | ICD-10-CM | POA: Diagnosis not present

## 2024-04-09 DIAGNOSIS — F0390 Unspecified dementia without behavioral disturbance: Secondary | ICD-10-CM | POA: Diagnosis not present

## 2024-04-09 DIAGNOSIS — R296 Repeated falls: Secondary | ICD-10-CM | POA: Diagnosis not present

## 2024-04-11 DIAGNOSIS — E039 Hypothyroidism, unspecified: Secondary | ICD-10-CM | POA: Diagnosis not present

## 2024-04-11 DIAGNOSIS — F0390 Unspecified dementia without behavioral disturbance: Secondary | ICD-10-CM | POA: Diagnosis not present

## 2024-04-11 DIAGNOSIS — L89152 Pressure ulcer of sacral region, stage 2: Secondary | ICD-10-CM | POA: Diagnosis not present

## 2024-04-11 DIAGNOSIS — W19XXXD Unspecified fall, subsequent encounter: Secondary | ICD-10-CM | POA: Diagnosis not present

## 2024-04-11 DIAGNOSIS — M069 Rheumatoid arthritis, unspecified: Secondary | ICD-10-CM | POA: Diagnosis not present

## 2024-04-11 DIAGNOSIS — G250 Essential tremor: Secondary | ICD-10-CM | POA: Diagnosis not present

## 2024-04-11 DIAGNOSIS — S72141D Displaced intertrochanteric fracture of right femur, subsequent encounter for closed fracture with routine healing: Secondary | ICD-10-CM | POA: Diagnosis not present

## 2024-04-11 DIAGNOSIS — Z9181 History of falling: Secondary | ICD-10-CM | POA: Diagnosis not present

## 2024-04-11 DIAGNOSIS — I10 Essential (primary) hypertension: Secondary | ICD-10-CM | POA: Diagnosis not present

## 2024-04-11 DIAGNOSIS — R296 Repeated falls: Secondary | ICD-10-CM | POA: Diagnosis not present

## 2024-04-15 DIAGNOSIS — F0390 Unspecified dementia without behavioral disturbance: Secondary | ICD-10-CM | POA: Diagnosis not present

## 2024-04-15 DIAGNOSIS — E039 Hypothyroidism, unspecified: Secondary | ICD-10-CM | POA: Diagnosis not present

## 2024-04-15 DIAGNOSIS — R296 Repeated falls: Secondary | ICD-10-CM | POA: Diagnosis not present

## 2024-04-15 DIAGNOSIS — M069 Rheumatoid arthritis, unspecified: Secondary | ICD-10-CM | POA: Diagnosis not present

## 2024-04-15 DIAGNOSIS — L89152 Pressure ulcer of sacral region, stage 2: Secondary | ICD-10-CM | POA: Diagnosis not present

## 2024-04-15 DIAGNOSIS — I10 Essential (primary) hypertension: Secondary | ICD-10-CM | POA: Diagnosis not present

## 2024-04-15 DIAGNOSIS — W19XXXD Unspecified fall, subsequent encounter: Secondary | ICD-10-CM | POA: Diagnosis not present

## 2024-04-15 DIAGNOSIS — S72141D Displaced intertrochanteric fracture of right femur, subsequent encounter for closed fracture with routine healing: Secondary | ICD-10-CM | POA: Diagnosis not present

## 2024-04-15 DIAGNOSIS — Z9181 History of falling: Secondary | ICD-10-CM | POA: Diagnosis not present

## 2024-04-15 DIAGNOSIS — G250 Essential tremor: Secondary | ICD-10-CM | POA: Diagnosis not present

## 2024-04-17 DIAGNOSIS — I10 Essential (primary) hypertension: Secondary | ICD-10-CM | POA: Diagnosis not present

## 2024-04-17 DIAGNOSIS — F0390 Unspecified dementia without behavioral disturbance: Secondary | ICD-10-CM | POA: Diagnosis not present

## 2024-04-17 DIAGNOSIS — S72141D Displaced intertrochanteric fracture of right femur, subsequent encounter for closed fracture with routine healing: Secondary | ICD-10-CM | POA: Diagnosis not present

## 2024-04-17 DIAGNOSIS — L89152 Pressure ulcer of sacral region, stage 2: Secondary | ICD-10-CM | POA: Diagnosis not present

## 2024-04-17 DIAGNOSIS — G250 Essential tremor: Secondary | ICD-10-CM | POA: Diagnosis not present

## 2024-04-17 DIAGNOSIS — M069 Rheumatoid arthritis, unspecified: Secondary | ICD-10-CM | POA: Diagnosis not present

## 2024-04-17 DIAGNOSIS — E039 Hypothyroidism, unspecified: Secondary | ICD-10-CM | POA: Diagnosis not present

## 2024-04-17 DIAGNOSIS — W19XXXD Unspecified fall, subsequent encounter: Secondary | ICD-10-CM | POA: Diagnosis not present

## 2024-04-17 DIAGNOSIS — R296 Repeated falls: Secondary | ICD-10-CM | POA: Diagnosis not present

## 2024-04-17 DIAGNOSIS — Z9181 History of falling: Secondary | ICD-10-CM | POA: Diagnosis not present

## 2024-04-18 DIAGNOSIS — F0390 Unspecified dementia without behavioral disturbance: Secondary | ICD-10-CM | POA: Diagnosis not present

## 2024-04-18 DIAGNOSIS — W19XXXD Unspecified fall, subsequent encounter: Secondary | ICD-10-CM | POA: Diagnosis not present

## 2024-04-18 DIAGNOSIS — G250 Essential tremor: Secondary | ICD-10-CM | POA: Diagnosis not present

## 2024-04-18 DIAGNOSIS — L89152 Pressure ulcer of sacral region, stage 2: Secondary | ICD-10-CM | POA: Diagnosis not present

## 2024-04-18 DIAGNOSIS — I10 Essential (primary) hypertension: Secondary | ICD-10-CM | POA: Diagnosis not present

## 2024-04-18 DIAGNOSIS — S72141D Displaced intertrochanteric fracture of right femur, subsequent encounter for closed fracture with routine healing: Secondary | ICD-10-CM | POA: Diagnosis not present

## 2024-04-18 DIAGNOSIS — Z9181 History of falling: Secondary | ICD-10-CM | POA: Diagnosis not present

## 2024-04-18 DIAGNOSIS — R296 Repeated falls: Secondary | ICD-10-CM | POA: Diagnosis not present

## 2024-04-18 DIAGNOSIS — M069 Rheumatoid arthritis, unspecified: Secondary | ICD-10-CM | POA: Diagnosis not present

## 2024-04-18 DIAGNOSIS — E039 Hypothyroidism, unspecified: Secondary | ICD-10-CM | POA: Diagnosis not present

## 2024-05-02 DIAGNOSIS — M069 Rheumatoid arthritis, unspecified: Secondary | ICD-10-CM | POA: Diagnosis not present

## 2024-05-02 DIAGNOSIS — F0280 Dementia in other diseases classified elsewhere without behavioral disturbance: Secondary | ICD-10-CM | POA: Diagnosis not present

## 2024-05-02 DIAGNOSIS — Z7982 Long term (current) use of aspirin: Secondary | ICD-10-CM | POA: Diagnosis not present

## 2024-05-02 DIAGNOSIS — I10 Essential (primary) hypertension: Secondary | ICD-10-CM | POA: Diagnosis not present

## 2024-05-02 DIAGNOSIS — W19XXXD Unspecified fall, subsequent encounter: Secondary | ICD-10-CM | POA: Diagnosis not present

## 2024-05-02 DIAGNOSIS — E039 Hypothyroidism, unspecified: Secondary | ICD-10-CM | POA: Diagnosis not present

## 2024-05-02 DIAGNOSIS — S72141D Displaced intertrochanteric fracture of right femur, subsequent encounter for closed fracture with routine healing: Secondary | ICD-10-CM | POA: Diagnosis not present

## 2024-05-02 DIAGNOSIS — G250 Essential tremor: Secondary | ICD-10-CM | POA: Diagnosis not present

## 2024-05-02 DIAGNOSIS — R296 Repeated falls: Secondary | ICD-10-CM | POA: Diagnosis not present

## 2024-05-02 DIAGNOSIS — Z9181 History of falling: Secondary | ICD-10-CM | POA: Diagnosis not present

## 2024-05-06 DIAGNOSIS — Z79899 Other long term (current) drug therapy: Secondary | ICD-10-CM | POA: Diagnosis not present

## 2024-05-06 DIAGNOSIS — M150 Primary generalized (osteo)arthritis: Secondary | ICD-10-CM | POA: Diagnosis not present

## 2024-05-06 DIAGNOSIS — M0609 Rheumatoid arthritis without rheumatoid factor, multiple sites: Secondary | ICD-10-CM | POA: Diagnosis not present

## 2024-05-06 DIAGNOSIS — M810 Age-related osteoporosis without current pathological fracture: Secondary | ICD-10-CM | POA: Diagnosis not present

## 2024-05-07 ENCOUNTER — Other Ambulatory Visit: Payer: Self-pay | Admitting: Internal Medicine

## 2024-05-09 ENCOUNTER — Telehealth

## 2024-05-09 ENCOUNTER — Ambulatory Visit: Admitting: Internal Medicine

## 2024-05-09 ENCOUNTER — Encounter: Payer: Self-pay | Admitting: Internal Medicine

## 2024-05-09 VITALS — BP 126/82 | HR 59 | Temp 98.2°F | Ht 60.0 in | Wt 114.8 lb

## 2024-05-09 DIAGNOSIS — R269 Unspecified abnormalities of gait and mobility: Secondary | ICD-10-CM

## 2024-05-09 DIAGNOSIS — R296 Repeated falls: Secondary | ICD-10-CM | POA: Diagnosis not present

## 2024-05-09 DIAGNOSIS — E039 Hypothyroidism, unspecified: Secondary | ICD-10-CM | POA: Diagnosis not present

## 2024-05-09 DIAGNOSIS — W19XXXD Unspecified fall, subsequent encounter: Secondary | ICD-10-CM | POA: Diagnosis not present

## 2024-05-09 DIAGNOSIS — I10 Essential (primary) hypertension: Secondary | ICD-10-CM

## 2024-05-09 DIAGNOSIS — Z9181 History of falling: Secondary | ICD-10-CM | POA: Diagnosis not present

## 2024-05-09 DIAGNOSIS — S72141D Displaced intertrochanteric fracture of right femur, subsequent encounter for closed fracture with routine healing: Secondary | ICD-10-CM | POA: Diagnosis not present

## 2024-05-09 DIAGNOSIS — F0280 Dementia in other diseases classified elsewhere without behavioral disturbance: Secondary | ICD-10-CM | POA: Diagnosis not present

## 2024-05-09 DIAGNOSIS — E559 Vitamin D deficiency, unspecified: Secondary | ICD-10-CM

## 2024-05-09 DIAGNOSIS — E538 Deficiency of other specified B group vitamins: Secondary | ICD-10-CM

## 2024-05-09 DIAGNOSIS — R7302 Impaired glucose tolerance (oral): Secondary | ICD-10-CM

## 2024-05-09 DIAGNOSIS — M069 Rheumatoid arthritis, unspecified: Secondary | ICD-10-CM | POA: Diagnosis not present

## 2024-05-09 DIAGNOSIS — G250 Essential tremor: Secondary | ICD-10-CM | POA: Diagnosis not present

## 2024-05-09 DIAGNOSIS — E7800 Pure hypercholesterolemia, unspecified: Secondary | ICD-10-CM

## 2024-05-09 DIAGNOSIS — Z7982 Long term (current) use of aspirin: Secondary | ICD-10-CM | POA: Diagnosis not present

## 2024-05-09 NOTE — Patient Instructions (Signed)
 Please continue all other medications as before  Please have the pharmacy call with any other refills you may need.  Please continue your efforts at being more active, low cholesterol diet, and weight control.  Please keep your appointments with your specialists as you may have planned  Please make an Appointment to return in 3 months, or sooner if needed  Ok to cancel the Aug 7 appt
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
## 2024-05-09 NOTE — Assessment & Plan Note (Signed)
 Lab Results  Component Value Date   LDLCALC 70 11/20/2023   Uncontrolled, pt to continue current statin zetia  10 mg every day, declines statin

## 2024-05-09 NOTE — Assessment & Plan Note (Signed)
 Conts to improve, cont PT and walker use outside the home, cane on the inside

## 2024-05-09 NOTE — Assessment & Plan Note (Signed)
 Last vitamin D  Lab Results  Component Value Date   VD25OH >120 11/20/2023   Stable, cont oral replacement

## 2024-05-09 NOTE — Assessment & Plan Note (Signed)
 Lab Results  Component Value Date   HGBA1C 5.1 11/20/2023   Stable, pt to continue current medical treatment  - diet, wt control

## 2024-05-09 NOTE — Assessment & Plan Note (Signed)
 BP Readings from Last 3 Encounters:  05/09/24 126/82  03/08/24 130/76  03/06/24 120/70   Stable, pt to continue medical treatment lopressor  50 bid

## 2024-05-09 NOTE — Assessment & Plan Note (Signed)
 Lab Results  Component Value Date   VITAMINB12 831 11/20/2023   Stable, cont oral replacement - b12 1000 mcg qd

## 2024-05-10 ENCOUNTER — Other Ambulatory Visit: Payer: Self-pay

## 2024-05-10 ENCOUNTER — Telehealth

## 2024-05-10 NOTE — Patient Instructions (Signed)
 Visit Information  Thank you for taking time to visit with me today. Please don't hesitate to contact me if I can be of assistance to you.  Patient has met all care management goals. Care Management case will be closed. Patient has been provided contact information should new needs arise.    Please call the Suicide and Crisis Lifeline: 988 call the [place] National Suicide Prevention Lifeline: 810-849-7905 or TTY: [phone] TTY (719)749-0080) to talk to a trained counselor if you are experiencing a Mental Health or Behavioral Health Crisis or need someone to talk to.  Kathyrn Sheriff, RN, MSN, BSN, CCM [place]  [facility], Population Health Case Manager Phone: [phone]
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
## 2024-05-16 DIAGNOSIS — E039 Hypothyroidism, unspecified: Secondary | ICD-10-CM | POA: Diagnosis not present

## 2024-05-16 DIAGNOSIS — M069 Rheumatoid arthritis, unspecified: Secondary | ICD-10-CM | POA: Diagnosis not present

## 2024-05-16 DIAGNOSIS — S72141D Displaced intertrochanteric fracture of right femur, subsequent encounter for closed fracture with routine healing: Secondary | ICD-10-CM | POA: Diagnosis not present

## 2024-05-16 DIAGNOSIS — W19XXXD Unspecified fall, subsequent encounter: Secondary | ICD-10-CM | POA: Diagnosis not present

## 2024-05-16 DIAGNOSIS — Z9181 History of falling: Secondary | ICD-10-CM | POA: Diagnosis not present

## 2024-05-16 DIAGNOSIS — Z7982 Long term (current) use of aspirin: Secondary | ICD-10-CM | POA: Diagnosis not present

## 2024-05-16 DIAGNOSIS — F0280 Dementia in other diseases classified elsewhere without behavioral disturbance: Secondary | ICD-10-CM | POA: Diagnosis not present

## 2024-05-16 DIAGNOSIS — R296 Repeated falls: Secondary | ICD-10-CM | POA: Diagnosis not present

## 2024-05-16 DIAGNOSIS — I10 Essential (primary) hypertension: Secondary | ICD-10-CM | POA: Diagnosis not present

## 2024-05-16 DIAGNOSIS — G250 Essential tremor: Secondary | ICD-10-CM | POA: Diagnosis not present

## 2024-05-23 ENCOUNTER — Ambulatory Visit: Admitting: Internal Medicine

## 2024-05-23 ENCOUNTER — Ambulatory Visit: Payer: Medicare HMO | Admitting: Internal Medicine

## 2024-05-25 ENCOUNTER — Other Ambulatory Visit: Payer: Self-pay | Admitting: Internal Medicine

## 2024-05-31 DIAGNOSIS — Z9181 History of falling: Secondary | ICD-10-CM | POA: Diagnosis not present

## 2024-05-31 DIAGNOSIS — G250 Essential tremor: Secondary | ICD-10-CM | POA: Diagnosis not present

## 2024-05-31 DIAGNOSIS — W19XXXD Unspecified fall, subsequent encounter: Secondary | ICD-10-CM | POA: Diagnosis not present

## 2024-05-31 DIAGNOSIS — S72141D Displaced intertrochanteric fracture of right femur, subsequent encounter for closed fracture with routine healing: Secondary | ICD-10-CM | POA: Diagnosis not present

## 2024-05-31 DIAGNOSIS — Z7982 Long term (current) use of aspirin: Secondary | ICD-10-CM | POA: Diagnosis not present

## 2024-05-31 DIAGNOSIS — R296 Repeated falls: Secondary | ICD-10-CM | POA: Diagnosis not present

## 2024-05-31 DIAGNOSIS — I10 Essential (primary) hypertension: Secondary | ICD-10-CM | POA: Diagnosis not present

## 2024-05-31 DIAGNOSIS — F0280 Dementia in other diseases classified elsewhere without behavioral disturbance: Secondary | ICD-10-CM | POA: Diagnosis not present

## 2024-05-31 DIAGNOSIS — M069 Rheumatoid arthritis, unspecified: Secondary | ICD-10-CM | POA: Diagnosis not present

## 2024-05-31 DIAGNOSIS — E039 Hypothyroidism, unspecified: Secondary | ICD-10-CM | POA: Diagnosis not present

## 2024-06-14 DIAGNOSIS — Z7982 Long term (current) use of aspirin: Secondary | ICD-10-CM | POA: Diagnosis not present

## 2024-06-14 DIAGNOSIS — E039 Hypothyroidism, unspecified: Secondary | ICD-10-CM | POA: Diagnosis not present

## 2024-06-14 DIAGNOSIS — M069 Rheumatoid arthritis, unspecified: Secondary | ICD-10-CM | POA: Diagnosis not present

## 2024-06-14 DIAGNOSIS — F0280 Dementia in other diseases classified elsewhere without behavioral disturbance: Secondary | ICD-10-CM | POA: Diagnosis not present

## 2024-06-14 DIAGNOSIS — Z9181 History of falling: Secondary | ICD-10-CM | POA: Diagnosis not present

## 2024-06-14 DIAGNOSIS — I10 Essential (primary) hypertension: Secondary | ICD-10-CM | POA: Diagnosis not present

## 2024-06-14 DIAGNOSIS — R296 Repeated falls: Secondary | ICD-10-CM | POA: Diagnosis not present

## 2024-06-14 DIAGNOSIS — W19XXXD Unspecified fall, subsequent encounter: Secondary | ICD-10-CM | POA: Diagnosis not present

## 2024-06-14 DIAGNOSIS — S72141D Displaced intertrochanteric fracture of right femur, subsequent encounter for closed fracture with routine healing: Secondary | ICD-10-CM | POA: Diagnosis not present

## 2024-06-20 DIAGNOSIS — G250 Essential tremor: Secondary | ICD-10-CM | POA: Diagnosis not present

## 2024-06-20 DIAGNOSIS — S72141D Displaced intertrochanteric fracture of right femur, subsequent encounter for closed fracture with routine healing: Secondary | ICD-10-CM | POA: Diagnosis not present

## 2024-06-20 DIAGNOSIS — Z9181 History of falling: Secondary | ICD-10-CM | POA: Diagnosis not present

## 2024-06-20 DIAGNOSIS — I10 Essential (primary) hypertension: Secondary | ICD-10-CM | POA: Diagnosis not present

## 2024-06-20 DIAGNOSIS — W19XXXD Unspecified fall, subsequent encounter: Secondary | ICD-10-CM | POA: Diagnosis not present

## 2024-06-20 DIAGNOSIS — Z7982 Long term (current) use of aspirin: Secondary | ICD-10-CM | POA: Diagnosis not present

## 2024-06-20 DIAGNOSIS — E039 Hypothyroidism, unspecified: Secondary | ICD-10-CM | POA: Diagnosis not present

## 2024-06-20 DIAGNOSIS — F0280 Dementia in other diseases classified elsewhere without behavioral disturbance: Secondary | ICD-10-CM | POA: Diagnosis not present

## 2024-06-20 DIAGNOSIS — M069 Rheumatoid arthritis, unspecified: Secondary | ICD-10-CM | POA: Diagnosis not present

## 2024-07-22 ENCOUNTER — Ambulatory Visit (INDEPENDENT_AMBULATORY_CARE_PROVIDER_SITE_OTHER)

## 2024-07-22 DIAGNOSIS — Z23 Encounter for immunization: Secondary | ICD-10-CM

## 2024-07-22 NOTE — Progress Notes (Signed)
 After obtaining consent, and per orders of Dr. Norleen, injection of HD FLU given by Ronnald SHAUNNA Palms. Patient instructed to report any adverse reaction to me immediately.

## 2024-08-01 DIAGNOSIS — Z79899 Other long term (current) drug therapy: Secondary | ICD-10-CM | POA: Diagnosis not present

## 2024-08-01 DIAGNOSIS — M810 Age-related osteoporosis without current pathological fracture: Secondary | ICD-10-CM | POA: Diagnosis not present

## 2024-08-01 DIAGNOSIS — M150 Primary generalized (osteo)arthritis: Secondary | ICD-10-CM | POA: Diagnosis not present

## 2024-08-01 DIAGNOSIS — M0609 Rheumatoid arthritis without rheumatoid factor, multiple sites: Secondary | ICD-10-CM | POA: Diagnosis not present

## 2024-08-09 ENCOUNTER — Encounter: Payer: Self-pay | Admitting: Internal Medicine

## 2024-08-09 ENCOUNTER — Ambulatory Visit: Admitting: Internal Medicine

## 2024-08-09 VITALS — BP 122/78 | HR 62 | Temp 98.2°F | Ht 60.0 in | Wt 121.0 lb

## 2024-08-09 DIAGNOSIS — E7800 Pure hypercholesterolemia, unspecified: Secondary | ICD-10-CM

## 2024-08-09 DIAGNOSIS — M19011 Primary osteoarthritis, right shoulder: Secondary | ICD-10-CM | POA: Diagnosis not present

## 2024-08-09 DIAGNOSIS — E559 Vitamin D deficiency, unspecified: Secondary | ICD-10-CM

## 2024-08-09 DIAGNOSIS — E538 Deficiency of other specified B group vitamins: Secondary | ICD-10-CM

## 2024-08-09 DIAGNOSIS — I10 Essential (primary) hypertension: Secondary | ICD-10-CM | POA: Diagnosis not present

## 2024-08-09 DIAGNOSIS — E039 Hypothyroidism, unspecified: Secondary | ICD-10-CM | POA: Diagnosis not present

## 2024-08-09 NOTE — Patient Instructions (Signed)
 You appear to have right AC joint arthritis today with pain - ok to use the topical Voltaren  gel as needed, and consider seeing Sports Medicine for any worsening, as cortisone might be helpful  Please continue all other medications as before, and refills have been done if requested.  Please have the pharmacy call with any other refills you may need.  Please keep your appointments with your specialists as you may have planned - Rheumatology  Please make an Appointment to return in 6 months, or sooner if needed
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
## 2024-08-12 ENCOUNTER — Encounter: Payer: Self-pay | Admitting: Internal Medicine

## 2024-08-12 DIAGNOSIS — M19019 Primary osteoarthritis, unspecified shoulder: Secondary | ICD-10-CM | POA: Insufficient documentation

## 2024-08-12 NOTE — Assessment & Plan Note (Signed)
 BP Readings from Last 3 Encounters:  08/09/24 122/78  05/09/24 126/82  03/08/24 130/76   Stable, pt to continue medical treatment lopressor  50 bid

## 2024-08-12 NOTE — Assessment & Plan Note (Signed)
 Lab Results  Component Value Date   VITAMINB12 831 11/20/2023   Stable, cont oral replacement - b12 1000 mcg qd

## 2024-08-12 NOTE — Assessment & Plan Note (Signed)
 Lab Results  Component Value Date   LDLCALC 70 11/20/2023   Uncontrolled with goal LDL < 70, pt declines statin , to continue zetia  10 qd

## 2024-08-12 NOTE — Assessment & Plan Note (Signed)
 Lab Results  Component Value Date   TSH 2.65 11/20/2023   Stable, pt to continue levothyroxine  50 mcg qd

## 2024-08-12 NOTE — Assessment & Plan Note (Signed)
 Mild to mod, You appear to have right AC joint arthritis today with pain - ok to use the topical Voltaren  gel as needed, and consider seeing Sports Medicine for any worsening, as cortisone might be helpful

## 2024-08-12 NOTE — Assessment & Plan Note (Addendum)
 Last vitamin D  Lab Results  Component Value Date   VD25OH >120 11/20/2023   Overcontrolled, cont oral replacement D3 at reduced dose

## 2024-08-26 ENCOUNTER — Other Ambulatory Visit: Payer: Self-pay | Admitting: Internal Medicine

## 2024-08-26 DIAGNOSIS — Z1231 Encounter for screening mammogram for malignant neoplasm of breast: Secondary | ICD-10-CM

## 2024-09-26 ENCOUNTER — Ambulatory Visit
Admission: RE | Admit: 2024-09-26 | Discharge: 2024-09-26 | Disposition: A | Discharge status: Home / Self Care | Source: Ambulatory Visit | Attending: Internal Medicine | Admitting: Internal Medicine

## 2024-09-26 DIAGNOSIS — Z1231 Encounter for screening mammogram for malignant neoplasm of breast: Secondary | ICD-10-CM

## 2024-10-23 NOTE — Telephone Encounter (Signed)
 SABRA

## 2024-11-03 ENCOUNTER — Other Ambulatory Visit: Payer: Self-pay | Admitting: Internal Medicine

## 2024-11-04 ENCOUNTER — Other Ambulatory Visit: Payer: Self-pay

## 2025-01-02 ENCOUNTER — Ambulatory Visit

## 2025-01-03 ENCOUNTER — Ambulatory Visit

## 2025-01-09 ENCOUNTER — Encounter: Admitting: Family Medicine

## 2025-02-11 ENCOUNTER — Ambulatory Visit: Admitting: Internal Medicine
